# Patient Record
Sex: Male | Born: 1950
Health system: Southern US, Community
[De-identification: ages and names within clinical notes are randomized; demographics above are authoritative.]

## PROBLEM LIST (undated history)

## (undated) DIAGNOSIS — G4730 Sleep apnea, unspecified: Secondary | ICD-10-CM

## (undated) DIAGNOSIS — H269 Unspecified cataract: Secondary | ICD-10-CM

## (undated) DIAGNOSIS — R7303 Prediabetes: Secondary | ICD-10-CM

## (undated) DIAGNOSIS — E669 Obesity, unspecified: Secondary | ICD-10-CM

## (undated) DIAGNOSIS — E785 Hyperlipidemia, unspecified: Secondary | ICD-10-CM

## (undated) DIAGNOSIS — M1990 Unspecified osteoarthritis, unspecified site: Secondary | ICD-10-CM

## (undated) DIAGNOSIS — K219 Gastro-esophageal reflux disease without esophagitis: Secondary | ICD-10-CM

## (undated) DIAGNOSIS — E559 Vitamin D deficiency, unspecified: Secondary | ICD-10-CM

## (undated) HISTORY — DX: Unspecified cataract: H26.9

## (undated) HISTORY — PX: JOINT REPLACEMENT: SHX530

## (undated) HISTORY — DX: Vitamin D deficiency, unspecified: E55.9

## (undated) HISTORY — DX: Sleep apnea, unspecified: G47.30

## (undated) HISTORY — DX: Gastro-esophageal reflux disease without esophagitis: K21.9

## (undated) HISTORY — PX: SPINE SURGERY: SHX786

## (undated) HISTORY — PX: EYE SURGERY: SHX253

## (undated) HISTORY — DX: Obesity, unspecified: E66.9

## (undated) HISTORY — DX: Hyperlipidemia, unspecified: E78.5

## (undated) HISTORY — DX: Prediabetes: R73.03

## (undated) HISTORY — DX: Unspecified osteoarthritis, unspecified site: M19.90

---

## 1997-10-06 ENCOUNTER — Other Ambulatory Visit: Admission: RE | Admit: 1997-10-06 | Discharge: 1997-10-06 | Payer: Self-pay | Admitting: Internal Medicine

## 1997-12-20 ENCOUNTER — Ambulatory Visit (HOSPITAL_COMMUNITY): Admission: RE | Admit: 1997-12-20 | Discharge: 1997-12-20 | Payer: Self-pay | Admitting: Gastroenterology

## 1999-09-25 ENCOUNTER — Encounter: Admission: RE | Admit: 1999-09-25 | Discharge: 1999-12-24 | Payer: Self-pay | Admitting: Internal Medicine

## 2000-04-12 ENCOUNTER — Encounter: Admission: RE | Admit: 2000-04-12 | Discharge: 2000-04-12 | Payer: Self-pay | Admitting: Orthopedic Surgery

## 2000-04-12 ENCOUNTER — Encounter: Payer: Self-pay | Admitting: Orthopedic Surgery

## 2004-11-17 ENCOUNTER — Ambulatory Visit (HOSPITAL_COMMUNITY): Admission: RE | Admit: 2004-11-17 | Discharge: 2004-11-17 | Payer: Self-pay | Admitting: Internal Medicine

## 2004-12-15 ENCOUNTER — Ambulatory Visit: Payer: Self-pay | Admitting: Gastroenterology

## 2004-12-22 ENCOUNTER — Encounter (INDEPENDENT_AMBULATORY_CARE_PROVIDER_SITE_OTHER): Payer: Self-pay | Admitting: Specialist

## 2004-12-22 ENCOUNTER — Ambulatory Visit: Payer: Self-pay | Admitting: Gastroenterology

## 2005-11-23 ENCOUNTER — Ambulatory Visit (HOSPITAL_COMMUNITY): Admission: RE | Admit: 2005-11-23 | Discharge: 2005-11-23 | Payer: Self-pay | Admitting: Internal Medicine

## 2006-12-20 ENCOUNTER — Ambulatory Visit (HOSPITAL_COMMUNITY): Admission: RE | Admit: 2006-12-20 | Discharge: 2006-12-20 | Payer: Self-pay | Admitting: Internal Medicine

## 2009-10-31 ENCOUNTER — Encounter: Payer: Self-pay | Admitting: Gastroenterology

## 2009-12-23 ENCOUNTER — Encounter (INDEPENDENT_AMBULATORY_CARE_PROVIDER_SITE_OTHER): Payer: Self-pay | Admitting: *Deleted

## 2010-01-23 ENCOUNTER — Encounter (INDEPENDENT_AMBULATORY_CARE_PROVIDER_SITE_OTHER): Payer: Self-pay | Admitting: *Deleted

## 2010-01-24 ENCOUNTER — Ambulatory Visit: Payer: Self-pay | Admitting: Gastroenterology

## 2010-02-03 LAB — HM COLONOSCOPY

## 2010-02-08 ENCOUNTER — Ambulatory Visit: Payer: Self-pay | Admitting: Gastroenterology

## 2010-02-10 ENCOUNTER — Encounter: Payer: Self-pay | Admitting: Gastroenterology

## 2010-04-04 NOTE — Miscellaneous (Signed)
Summary: [facility]  Clinical Lists Changes  Medications: Added new medication of MOVIPREP 100 GM  SOLR (PEG-KCL-NACL-NASULF-NA ASC-C) As per prep instructions. - Signed Rx of MOVIPREP 100 GM  SOLR (PEG-KCL-NACL-NASULF-NA ASC-C) As per prep instructions.;  #1 x 0;  Signed;  Entered by: Ezra Sites RN;  Authorized by: Rachael Fee MD;  Method used: Electronically to [facility]  [address] [id number] [address], [place], [place]  [address], Ph: [phone] or [phone], Fax: [phone] Observations: Added new observation of NKA: T (01/24/2010 8:08)    Prescriptions: MOVIPREP 100 GM  SOLR (PEG-KCL-NACL-NASULF-NA ASC-C) As per prep instructions.  #1 x 0   Entered by:   Ezra Sites RN   Authorized by:   Rachael Fee MD   Signed by:   Ezra Sites RN on 01/24/2010   Method used:   [facility]ronically to        [facility]  [address] [id number] ([facility]       [address]       [place], [place]  [address]       Ph: [phone] or [phone]       Fax: [phone]   RxID:   [id number]

## 2010-04-04 NOTE — Procedures (Signed)
Summary: Colonoscopy  Patient: Jason Dennis Note: All result statuses are Final unless otherwise noted.  Tests: (1) Colonoscopy (COL)   COL Colonoscopy           DONE     [facility]     [address]     [place], [place]  [address]           COLONOSCOPY PROCEDURE REPORT           PATIENT:  Jason Dennis, Jason Dennis  MR#:  [id number]     BIRTHDATE:  29-Dec-1950, 59 yrs. old  GENDER:  male     ENDOSCOPIST:  Rachael Fee, MD     PROCEDURE DATE:  02/08/2010     PROCEDURE:  Colonoscopy with snare polypectomy     ASA CLASS:  Class II     INDICATIONS:  Elevated Risk Screening, father had colon cancer     (age unknown), personal history of hyperplastic polyps only     MEDICATIONS:   Fentanyl 50 mcg IV, Versed 5 mg IV           DESCRIPTION OF PROCEDURE:   After the risks benefits and     alternatives of the procedure were thoroughly explained, informed     consent was obtained.  Digital rectal exam was performed and     revealed no rectal masses.   The LB [id number]-[id number] endoscope     was introduced through the anus and advanced to the cecum, which     was identified by both the appendix and ileocecal valve, without     limitations.  The quality of the prep was good, using MoviPrep.     The instrument was then slowly withdrawn as the colon was fully     examined.     <<PROCEDUREIMAGES>>           FINDINGS:  Four sessile polyps were removed with cold snare, three     were retrieved and sent to pathology (jar 1). These were located     in ascending, descending and sigmoid segments (see image6 and     image7).  Mild diverticulosis was found in the sigmoid to     descending colon segments (see image1).  This was otherwise a     normal examination of the colon (see image4, image3, and image8).     Retroflexed views in the rectum revealed no abnormalities.    The     scope was then withdrawn from the patient and the procedure     completed.           COMPLICATIONS:  None  ENDOSCOPIC IMPRESSION:     1) Four polyps were found, all removed and three were retrived,     sent to pathology     2) Mild diverticulosis in the sigmoid to descending colon     segments     3) Otherwise normal examination           RECOMMENDATIONS:     1) Given your significant family history of colon cancer, you     should have a repeat colonoscopy in 3-5 years even if the polyps     removed today are not precancerous.     2) You will receive a letter within 1-2 weeks with the results     of your biopsy as well as final recommendations. Please call my     office if you have not received a letter after 3 weeks.  ______________________________     Rachael Fee, MD           n.     eSIGNED:   Rachael Fee at 02/08/2010 09:11 AM           Willeen Cass, [phone]  Note: An exclamation mark (!) indicates a result that was not dispersed into the flowsheet. Document Creation Date: 02/08/2010 9:11 AM _______________________________________________________________________  (1) Order result status: Final Collection or observation date-time: 02/08/2010 09:06 Requested date-time:  Receipt date-time:  Reported date-time:  Referring Physician:   Ordering Physician: Rob Bunting [phone]) Specimen Source:  Source: Launa Grill Order Number: [id number] Lab site:   Appended Document: Colonoscopy     Procedures Next Due Date:    Colonoscopy: 02/2013

## 2010-04-04 NOTE — Letter (Signed)
Summary: Pre Visit Letter Revised  [facility]  [address] [place], [place] [address]   Phone: [phone]  Fax: [phone]        12/23/2009 MRN: [id number] Jason Dennis [address] [place], [place]  [address]             Procedure Date:  02-08-10   Welcome to the Gastroenterology Division at [facility].    You are scheduled to see a nurse for your pre-procedure visit on 01-24-10 at 8:00a.m. on the 3rd floor at [facility], [address]  We ask that you try to arrive at our office 15 minutes prior to your appointment time to allow for check-in.  Please take a minute to review the attached form.  If you answer "Yes" to one or more of the questions on the first page, we ask that you call the person listed at your earliest opportunity.  If you answer "No" to all of the questions, please complete the rest of the form and bring it to your appointment.    Your nurse visit will consist of discussing your medical and surgical history, your immediate family medical history, and your medications.   If you are unable to list all of your medications on the form, please bring the medication bottles to your appointment and we will list them.  We will need to be aware of both prescribed and over the counter drugs.  We will need to know exact dosage information as well.    Please be prepared to read and sign documents such as consent forms, a financial agreement, and acknowledgement forms.  If necessary, and with your consent, a friend or relative is welcome to sit-in on the nurse visit with you.  Please bring your insurance card so that we may make a copy of it.  If your insurance requires a referral to see a specialist, please bring your referral form from your primary care physician.  No co-pay is required for this nurse visit.     If you cannot keep your appointment, please call [phone] to cancel or reschedule prior to your appointment date.  This allows  [place] the opportunity to schedule an appointment for another patient in need of care.    Thank you for choosing [facility] for your medical needs.  We appreciate the opportunity to care for you.  Please visit [place] at our website  to learn more about our practice.  Sincerely, The Gastroenterology Division

## 2010-04-04 NOTE — Letter (Signed)
Summary: [facility] Instructions  [facility]  [address] [place], [place] [address]   Phone: [phone]  Fax: [phone]       Jason Dennis    [birth year]    MRN: [id number]        Procedure Day Dorna Bloom:  Wednesday 02/08/2010     Arrival Time: 8:00 am      Procedure Time: 9:00 am     Location of Procedure:                    _x _  [facility] (4th Floor)                        PREPARATION FOR COLONOSCOPY WITH MOVIPREP   Starting 5 days prior to your procedure Friday 12/2 do not eat nuts, seeds, popcorn, corn, beans, peas,  salads, or any raw vegetables.  Do not take any fiber supplements (e.g. Metamucil, Citrucel, and Benefiber).  THE DAY BEFORE YOUR PROCEDURE         DATE: Tuesday 12/6  1.  Drink clear liquids the entire day-NO SOLID FOOD  2.  Do not drink anything colored red or purple.  Avoid juices with pulp.  No orange juice.  3.  Drink at least 64 oz. (8 glasses) of fluid/clear liquids during the day to prevent dehydration and help the prep work efficiently.  CLEAR LIQUIDS INCLUDE: Water Jello Ice Popsicles Tea (sugar ok, no milk/cream) Powdered fruit flavored drinks Coffee (sugar ok, no milk/cream) Gatorade Juice: apple, white grape, white cranberry  Lemonade Clear bullion, consomm, broth Carbonated beverages (any kind) Strained chicken noodle soup Hard Candy                             4.  In the morning, mix first dose of MoviPrep solution:    Empty 1 Pouch A and 1 Pouch B into the disposable container    Add lukewarm drinking water to the top line of the container. Mix to dissolve    Refrigerate (mixed solution should be used within 24 hrs)  5.  Begin drinking the prep at 5:00 p.m. The MoviPrep container is divided by 4 marks.   Every 15 minutes drink the solution down to the next mark (approximately 8 oz) until the full liter is complete.   6.  Follow completed prep with 16 oz of clear liquid of your choice (Nothing  red or purple).  Continue to drink clear liquids until bedtime.  7.  Before going to bed, mix second dose of MoviPrep solution:    Empty 1 Pouch A and 1 Pouch B into the disposable container    Add lukewarm drinking water to the top line of the container. Mix to dissolve    Refrigerate  THE DAY OF YOUR PROCEDURE      DATE: Wednesday 12/7  Beginning at 4:00 a.m. (5 hours before procedure):         1. Every 15 minutes, drink the solution down to the next mark (approx 8 oz) until the full liter is complete.  2. Follow completed prep with 16 oz. of clear liquid of your choice.    3. You may drink clear liquids until 7:00 am (2 HOURS BEFORE PROCEDURE).   MEDICATION INSTRUCTIONS  Unless otherwise instructed, you should take regular prescription medications with a small sip of water   as early as possible the morning of  your procedure.         OTHER INSTRUCTIONS  You will need a responsible adult at least 60 years of age to accompany you and drive you home.   This person must remain in the waiting room during your procedure.  Wear loose fitting clothing that is easily removed.  Leave jewelry and other valuables at home.  However, you may wish to bring a book to read or  an iPod/MP3 player to listen to music as you wait for your procedure to start.  Remove all body piercing jewelry and leave at home.  Total time from sign-in until discharge is approximately 2-3 hours.  You should go home directly after your procedure and rest.  You can resume normal activities the  day after your procedure.  The day of your procedure you should not:   Drive   Make legal decisions   Operate machinery   Drink alcohol   Return to work  You will receive specific instructions about eating, activities and medications before you leave.    The above instructions have been reviewed and explained to me by  Ezra Sites RN  January 24, 2010 8:22 AM     I fully understand and can  verbalize these instructions _____________________________ Date _________

## 2010-04-04 NOTE — Letter (Signed)
Summary: Colonoscopy Letter  [facility]  [address]   [place], [place] [address]   Phone: [phone]  Fax: [phone]      October 31, 2009 MRN: [id number]   Jason Dennis [address] [place], [place]  [address]   Dear Mr. Escudero,   According to your medical record, it is time for you to schedule a Colonoscopy. The American Cancer Society recommends this procedure as a method to detect early colon cancer. Patients with a family history of colon cancer, or a personal history of colon polyps or inflammatory bowel disease are at increased risk.  This letter has been generated based on the recommendations made at the time of your procedure. If you feel that in your particular situation this may no longer apply, please contact our office.  Please call our office at [phone] to schedule this appointment or to update your records at your earliest convenience.  Thank you for cooperating with us to provide you with the very best care possible.   Sincerely,  Daniel P. Jacobs, M.D.  [facility] Gastroenterology Division [phone] 

## 2010-04-04 NOTE — Letter (Signed)
Summary: Colonoscopy Letter  [facility]  [address] [place], [place] [address]   Phone: [phone]  Fax: [phone]      October 31, 2009 MRN: [id number]   ALTONIO SCHWERTNER [address] [place], [place]  [address]   Dear Mr. Williard,   According to your medical record, it is time for you to schedule a Colonoscopy. The American Cancer Society recommends this procedure as a method to detect early colon cancer. Patients with a family history of colon cancer, or a personal history of colon polyps or inflammatory bowel disease are at increased risk.  This letter has been generated based on the recommendations made at the time of your procedure. If you feel that in your particular situation this may no longer apply, please contact our office.  Please call our office at [phone] to schedule this appointment or to update your records at your earliest convenience.  Thank you for cooperating with [place] to provide you with the very best care possible.   Sincerely,  Rachael Fee, M.D.  [facility] Gastroenterology Division [phone]

## 2010-04-06 NOTE — Letter (Signed)
Summary: Results Letter  [facility]  [address] [place], [place] [address]   Phone: [phone]  Fax: [phone]        February 10, 2010 MRN: [id number]    Jason Dennis [address] [place], [place]  [address]    Dear [name],   The polyps removed during your recent procedure were proven to be adenomatous.  These are pre-cancerous polyps that may have grown into cancers if they had not been removed.  Based on current nationally recognized surveillance guidelines, I recommend that you have a repeat colonoscopy in 3 years.   We will therefore put your information in our reminder system and will contact you in 3 years to schedule a repeat procedure.  Please call if you have any questions or concerns.       Sincerely,  Rachael Fee MD  This letter has been electronically signed by your physician.  Appended Document: Results Letter Letter mailed

## 2012-12-09 ENCOUNTER — Encounter: Payer: Self-pay | Admitting: Gastroenterology

## 2013-02-03 ENCOUNTER — Encounter: Payer: Self-pay | Admitting: Physician Assistant

## 2013-02-06 ENCOUNTER — Encounter: Payer: Self-pay | Admitting: Physician Assistant

## 2013-02-06 DIAGNOSIS — E669 Obesity, unspecified: Secondary | ICD-10-CM | POA: Insufficient documentation

## 2013-02-06 DIAGNOSIS — I10 Essential (primary) hypertension: Secondary | ICD-10-CM | POA: Insufficient documentation

## 2013-02-06 DIAGNOSIS — E782 Mixed hyperlipidemia: Secondary | ICD-10-CM | POA: Insufficient documentation

## 2013-02-06 DIAGNOSIS — E663 Overweight: Secondary | ICD-10-CM | POA: Insufficient documentation

## 2013-02-06 DIAGNOSIS — E559 Vitamin D deficiency, unspecified: Secondary | ICD-10-CM | POA: Insufficient documentation

## 2013-02-06 DIAGNOSIS — R7309 Other abnormal glucose: Secondary | ICD-10-CM | POA: Insufficient documentation

## 2013-02-06 NOTE — Progress Notes (Signed)
This encounter was created in error - please disregard.

## 2013-03-04 ENCOUNTER — Other Ambulatory Visit: Payer: Self-pay | Admitting: Internal Medicine

## 2013-03-06 ENCOUNTER — Encounter: Payer: Self-pay | Admitting: Physician Assistant

## 2013-03-06 ENCOUNTER — Ambulatory Visit (INDEPENDENT_AMBULATORY_CARE_PROVIDER_SITE_OTHER): Payer: BC Managed Care – PPO | Admitting: Physician Assistant

## 2013-03-06 VITALS — BP 122/70 | HR 76 | Temp 98.3°F | Resp 16 | Ht 71.0 in | Wt 201.0 lb

## 2013-03-06 DIAGNOSIS — Z79899 Other long term (current) drug therapy: Secondary | ICD-10-CM

## 2013-03-06 DIAGNOSIS — E782 Mixed hyperlipidemia: Secondary | ICD-10-CM

## 2013-03-06 DIAGNOSIS — R7303 Prediabetes: Secondary | ICD-10-CM

## 2013-03-06 DIAGNOSIS — E785 Hyperlipidemia, unspecified: Secondary | ICD-10-CM

## 2013-03-06 DIAGNOSIS — E559 Vitamin D deficiency, unspecified: Secondary | ICD-10-CM

## 2013-03-06 DIAGNOSIS — I10 Essential (primary) hypertension: Secondary | ICD-10-CM

## 2013-03-06 DIAGNOSIS — R7309 Other abnormal glucose: Secondary | ICD-10-CM

## 2013-03-06 LAB — URIC ACID: Uric Acid, Serum: 5.9 mg/dL (ref 4.0–7.8)

## 2013-03-06 LAB — CBC WITH DIFFERENTIAL/PLATELET
Basophils Absolute: 0 10*3/uL (ref 0.0–0.1)
Basophils Relative: 0 % (ref 0–1)
EOS ABS: 0.3 10*3/uL (ref 0.0–0.7)
Eosinophils Relative: 6 % — ABNORMAL HIGH (ref 0–5)
HEMATOCRIT: 43 % (ref 39.0–52.0)
Hemoglobin: 14.7 g/dL (ref 13.0–17.0)
Lymphocytes Relative: 20 % (ref 12–46)
Lymphs Abs: 1.1 10*3/uL (ref 0.7–4.0)
MCH: 29.7 pg (ref 26.0–34.0)
MCHC: 34.2 g/dL (ref 30.0–36.0)
MCV: 86.9 fL (ref 78.0–100.0)
MONO ABS: 0.6 10*3/uL (ref 0.1–1.0)
MONOS PCT: 10 % (ref 3–12)
Neutro Abs: 3.5 10*3/uL (ref 1.7–7.7)
Neutrophils Relative %: 64 % (ref 43–77)
Platelets: 232 10*3/uL (ref 150–400)
RBC: 4.95 MIL/uL (ref 4.22–5.81)
RDW: 13.3 % (ref 11.5–15.5)
WBC: 5.5 10*3/uL (ref 4.0–10.5)

## 2013-03-06 LAB — LIPID PANEL
Cholesterol: 160 mg/dL (ref 0–200)
HDL: 59 mg/dL (ref >39)
LDL CALC: 87 mg/dL (ref 0–99)
TRIGLYCERIDES: 68 mg/dL (ref <150)
Total CHOL/HDL Ratio: 2.7 Ratio
VLDL: 14 mg/dL (ref 0–40)

## 2013-03-06 LAB — HEPATIC FUNCTION PANEL
ALT: 32 U/L (ref 0–53)
AST: 21 U/L (ref 0–37)
Albumin: 4.5 g/dL (ref 3.5–5.2)
Alkaline Phosphatase: 86 U/L (ref 39–117)
BILIRUBIN DIRECT: 0.2 mg/dL (ref 0.0–0.3)
BILIRUBIN INDIRECT: 1 mg/dL — AB (ref 0.0–0.9)
TOTAL PROTEIN: 6.7 g/dL (ref 6.0–8.3)
Total Bilirubin: 1.2 mg/dL (ref 0.3–1.2)

## 2013-03-06 LAB — TSH: TSH: 2.213 u[IU]/mL (ref 0.350–4.500)

## 2013-03-06 LAB — HEMOGLOBIN A1C
HEMOGLOBIN A1C: 5.8 % — AB (ref <5.7)
MEAN PLASMA GLUCOSE: 120 mg/dL — AB (ref <117)

## 2013-03-06 LAB — BASIC METABOLIC PANEL WITH GFR
BUN: 19 mg/dL (ref 6–23)
CALCIUM: 9.8 mg/dL (ref 8.4–10.5)
CHLORIDE: 104 meq/L (ref 96–112)
CO2: 28 meq/L (ref 19–32)
CREATININE: 0.96 mg/dL (ref 0.50–1.35)
GFR, Est African American: >89 mL/min
GFR, Est Non African American: 84 mL/min
Glucose, Bld: 90 mg/dL (ref 70–99)
Potassium: 5.2 mEq/L (ref 3.5–5.3)
Sodium: 145 mEq/L (ref 135–145)

## 2013-03-06 LAB — MAGNESIUM: Magnesium: 1.9 mg/dL (ref 1.5–2.5)

## 2013-03-06 NOTE — Progress Notes (Signed)
HPI Patient presents for 3 month follow up with hypertension, hyperlipidemia, prediabetes and vitamin D. Patient's blood pressure has been controlled at home, today their BP is BP: 122/70 mmHg  Patient denies chest pain, shortness of breath, dizziness.  Patient's cholesterol is diet controlled. The cholesterol last visit was LDL 87, trigs 151, HDL 44.  The patient has been working on diet and exercise for prediabetes and has lost 18 lbs, and denies changes in vision, polys, and paresthesias. A1C 5.9.  Patient is on Vitamin D supplement.  Vitamin D 69.  Patient is right handed and states that he is waking up in the morning and his right 4th digit and his left 3rd digit is flexed and he has to manually pop it to extend it. It it not painful, erythema, swollen and does not bother him during the day. Patient states he plays guitar daily.  Current Medications:  Current Outpatient Prescriptions on File Prior to Visit  Medication Sig Dispense Refill  . aspirin 81 MG tablet Take 81 mg by mouth every other day.       . cholecalciferol (VITAMIN D) 1000 UNITS tablet Take 10,000 Units by mouth daily.      . meclizine (ANTIVERT) 25 MG tablet TAKE ONE TABLET BY MOUTH THREE TIMES DAILY AS NEEDED FOR DIZZINESS/VERTIGO.  30 tablet  0  . meloxicam (MOBIC) 15 MG tablet Take 15 mg by mouth daily.      . naproxen sodium (ANAPROX) 220 MG tablet Take 220 mg by mouth 2 (two) times daily with a meal.      . Omega-3 Fatty Acids (FISH OIL) 1000 MG CAPS Take by mouth.      Marland Kitchen omeprazole (PRILOSEC) 20 MG capsule Take 20 mg by mouth daily.       No current facility-administered medications on file prior to visit.   Medical History:  Past Medical History  Diagnosis Date  . Hyperlipidemia   . Hypertension     labile  . GERD (gastroesophageal reflux disease)   . Prediabetes   . Vitamin D deficiency   . OSA (obstructive sleep apnea)     On CPAP 10 cm  . Obesity    Allergies:  Allergies  Allergen Reactions  . Cialis  [Tadalafil]     ROS Constitutional: Denies fever, chills, headaches, insomnia, fatigue, night sweats Eyes: Denies redness, blurred vision, diplopia, discharge, itchy, watery eyes.  ENT: Denies congestion, post nasal drip, sore throat, earache, dental pain, Tinnitus, Vertigo, Sinus pain, snoring.  Cardio: Denies chest pain, palpitations, irregular heartbeat, dyspnea, diaphoresis, orthopnea, PND, claudication, edema Respiratory: denies cough, shortness of breath, wheezing.  Gastrointestinal: Denies dysphagia, heartburn, AB pain/ cramps, N/V, diarrhea, constipation, hematemesis, melena, hematochezia,  hemorrhoids Genitourinary: Denies dysuria, frequency, urgency, nocturia, hesitancy, discharge, hematuria, flank pain Musculoskeletal: + trigger finger Denies myalgia, stiffness, pain, swelling and strain/sprain. Skin: Denies pruritis, rash, changing in skin lesion Neuro: Denies Weakness, tremor, incoordination, spasms, pain Psychiatric: Denies confusion, memory loss, sensory loss Endocrine: Denies change in weight, skin, hair change, nocturia Diabetic Polys, Denies visual blurring, hyper /hypo glycemic episodes, and paresthesia, Heme/Lymph: Denies Excessive bleeding, bruising, enlarged lymph nodes  Family history- Review and unchanged Social history- Review and unchanged Physical Exam: Filed Vitals:   03/06/13 0843  BP: 122/70  Pulse: 76  Temp: 98.3 F (36.8 C)  Resp: 16   Filed Weights   03/06/13 0843  Weight: 201 lb (91.173 kg)   General Appearance: Well nourished, in no apparent distress. Eyes: PERRLA, EOMs, conjunctiva no swelling  or erythema Sinuses: No Frontal/maxillary tenderness ENT/Mouth: Ext aud canals clear, TMs without erythema, bulging. No erythema, swelling, or exudate on post pharynx.  Tonsils not swollen or erythematous. Hearing normal.  Neck: Supple, thyroid normal.  Respiratory: Respiratory effort normal, BS equal bilaterally without rales, rhonchi, wheezing or  stridor.  Cardio: RRR with no MRGs. Brisk peripheral pulses without edema.  Abdomen: Soft, + BS.  Non tender, no guarding, rebound, hernias, masses. Lymphatics: Non tender without lymphadenopathy.  Musculoskeletal: Full ROM, 5/5 strength, normal gait. Bilateral hands without swelling, erythema, or warmth. Mild trigger finger on right 3rd greater than left 4th.  Skin: Warm, dry without rashes, lesions, ecchymosis.  Neuro: Cranial nerves intact. Normal muscle tone, no cerebellar symptoms. Sensation intact.  Psych: Awake and oriented X 3, normal affect, Insight and Judgment appropriate.   Assessment and Plan:  Hypertension: Continue medication, monitor blood pressure at home.  Continue DASH diet. Cholesterol: Continue diet and exercise. Check cholesterol.  Pre-diabetes-Continue diet and exercise. Check A1C Vitamin D Def- check level and continue medications.  Trigger finger- check uric acid, patient has family history and given conservative measures does not want injection at this time.  Continue diet and meds as discussed. Further disposition pending results of labs.  Vicie Mutters 8:50 AM

## 2013-03-06 NOTE — Patient Instructions (Addendum)
Trigger Finger Trigger finger (digital tendinitis and stenosing tenosynovitis) is a common disorder that causes an often painful catching of the fingers or thumb. It occurs as a clicking, snapping, or locking of a finger in the palm of the hand. This is caused by a problem with the tendons that flex or bend the fingers sliding smoothly through their sheaths. The condition may occur in any finger or a couple fingers at the same time.  The finger may lock with the finger curled or suddenly straighten out with a snap. This is more common in patients with rheumatoid arthritis and diabetes. Left untreated, the condition may get worse to the point where the finger becomes locked in flexion, like making a fist, or less commonly locked with the finger straightened out. CAUSES   Inflammation and scarring that lead to swelling around the tendon sheath.  Repeated or forceful movements.  Rheumatoid arthritis, an autoimmune disease that affects joints.  Gout.  Diabetes mellitus. SIGNS AND SYMPTOMS  Soreness and swelling of your finger.  A painful clicking or snapping as you bend and straighten your finger. DIAGNOSIS  Your health care provider will do a physical exam of your finger to diagnose trigger finger. TREATMENT   Splinting for 6 8 weeks may be helpful.  Nonsteroidal anti-inflammatory medicines (NSAIDs) can help to relieve the pain and inflammation.  Cortisone injections, along with splinting, may speed up recovery. Several injections may be required. Cortisone may give relief after one injection.  Surgery is another treatment that may be used if conservative treatments do not work. Surgery can be minor, without incisions (a cut does not have to be made), and can be done with a needle through the skin.  Other surgical choices involve an open procedure in which the surgeon opens the hand through a small incision and cuts the pulley so the tendon can again slide smoothly. Your hand will still  work fine. HOME CARE INSTRUCTIONS  Apply ice to the injured area, twice per day:  Put ice in a plastic bag.  Place a towel between your skin and the bag.  Leave the ice on for 20 minutes, 3 4 times a day.  Rest your hand often. MAKE SURE YOU:   Understand these instructions.  Will watch your condition.  Will get help right away if you are not doing well or get worse. Document Released: 12/10/2003 Document Revised: 10/22/2012 Document Reviewed: 07/22/2012 [facility] Patient Information 2014 [place].  I suggest getting a finger splint that keeps your finger straight and wear it at night for 2-4 weeks, continue the meloxicam daily with food, and ice it.     Bad carbs also include fruit juice, alcohol, and sweet tea. These are empty calories that do not signal to your brain that you are full.   Please remember the good carbs are still carbs which convert into sugar. So please measure them out no more than 1/2-1 cup of rice, oatmeal, pasta, and beans.  Veggies are however free foods! Pile them on.   I like lean protein at every meal such as chicken, [place], pork chops, cottage cheese, etc. Just do not fry these meats and please center your meal around vegetable, the meats should be a side dish.   No all fruit is created equal. Please see the list below, the fruit at the bottom is higher in sugars than the fruit at the top

## 2013-03-07 LAB — VITAMIN D 25 HYDROXY (VIT D DEFICIENCY, FRACTURES): Vit D, 25-Hydroxy: 71 ng/mL (ref 30–89)

## 2013-03-07 LAB — INSULIN, FASTING: INSULIN FASTING, SERUM: 11 u[IU]/mL (ref 3–28)

## 2013-04-05 ENCOUNTER — Other Ambulatory Visit: Payer: Self-pay | Admitting: Physician Assistant

## 2013-05-08 ENCOUNTER — Ambulatory Visit: Payer: Self-pay | Admitting: Internal Medicine

## 2013-05-15 ENCOUNTER — Ambulatory Visit (INDEPENDENT_AMBULATORY_CARE_PROVIDER_SITE_OTHER): Payer: BC Managed Care – PPO | Admitting: Physician Assistant

## 2013-05-15 VITALS — BP 126/64 | HR 80 | Temp 99.0°F | Resp 16 | Wt 207.6 lb

## 2013-05-15 DIAGNOSIS — R7989 Other specified abnormal findings of blood chemistry: Secondary | ICD-10-CM

## 2013-05-15 DIAGNOSIS — L299 Pruritus, unspecified: Secondary | ICD-10-CM

## 2013-05-15 DIAGNOSIS — R21 Rash and other nonspecific skin eruption: Secondary | ICD-10-CM

## 2013-05-15 LAB — CBC WITH DIFFERENTIAL/PLATELET
BASOS PCT: 1 % (ref 0–1)
Basophils Absolute: 0.1 10*3/uL (ref 0.0–0.1)
EOS ABS: 0.7 10*3/uL (ref 0.0–0.7)
Eosinophils Relative: 11 % — ABNORMAL HIGH (ref 0–5)
HCT: 41.4 % (ref 39.0–52.0)
Hemoglobin: 14.2 g/dL (ref 13.0–17.0)
LYMPHS ABS: 1.1 10*3/uL (ref 0.7–4.0)
Lymphocytes Relative: 18 % (ref 12–46)
MCH: 30.2 pg (ref 26.0–34.0)
MCHC: 34.3 g/dL (ref 30.0–36.0)
MCV: 88.1 fL (ref 78.0–100.0)
Monocytes Absolute: 0.6 10*3/uL (ref 0.1–1.0)
Monocytes Relative: 9 % (ref 3–12)
NEUTROS PCT: 61 % (ref 43–77)
Neutro Abs: 3.8 10*3/uL (ref 1.7–7.7)
Platelets: 219 10*3/uL (ref 150–400)
RBC: 4.7 MIL/uL (ref 4.22–5.81)
RDW: 13.2 % (ref 11.5–15.5)
WBC: 6.3 10*3/uL (ref 4.0–10.5)

## 2013-05-15 LAB — BASIC METABOLIC PANEL WITH GFR
BUN: 18 mg/dL (ref 6–23)
CALCIUM: 9.4 mg/dL (ref 8.4–10.5)
CO2: 29 mEq/L (ref 19–32)
Chloride: 104 mEq/L (ref 96–112)
Creat: 0.84 mg/dL (ref 0.50–1.35)
GFR, Est Non African American: >89 mL/min
Glucose, Bld: 82 mg/dL (ref 70–99)
Potassium: 4.5 mEq/L (ref 3.5–5.3)
SODIUM: 140 meq/L (ref 135–145)

## 2013-05-15 LAB — HEPATIC FUNCTION PANEL
ALT: 29 U/L (ref 0–53)
AST: 19 U/L (ref 0–37)
Albumin: 4.4 g/dL (ref 3.5–5.2)
Alkaline Phosphatase: 109 U/L (ref 39–117)
BILIRUBIN DIRECT: 0.1 mg/dL (ref 0.0–0.3)
BILIRUBIN TOTAL: 0.5 mg/dL (ref 0.2–1.2)
Indirect Bilirubin: 0.4 mg/dL (ref 0.2–1.2)
Total Protein: 6.3 g/dL (ref 6.0–8.3)

## 2013-05-15 MED ORDER — PREDNISONE 20 MG PO TABS: ORAL_TABLET | ORAL | Status: DC

## 2013-05-15 NOTE — Progress Notes (Addendum)
   Subjective:    Patient ID: Jason Dennis, male    DOB: 25-Apr-1950, 63 y.o.   MRN: [id number]  HPI 63 y.o. male presents with history of chills,and pruritis without rash for 1 week. States it is diffuse, over back, arms, legs, not on face, hands, feet, genitals. No changes in detergents, medications, etc. Lotion and benadryl has helped, he has decreased heat in showers. He does work outside a lot and sweats.   Review of Systems  Constitutional: Positive for chills. Negative for fever and fatigue.  HENT: Negative.   Eyes: Negative.   Respiratory: Positive for cough. Negative for chest tightness, shortness of breath and wheezing.   Cardiovascular: Negative.   Gastrointestinal: Negative.   Genitourinary: Negative.   Musculoskeletal: Negative.   Skin: Positive for rash.       Pruritis  Neurological: Negative.   Hematological: Negative for adenopathy. Bruises/bleeds easily.       Objective:   Physical Exam  Constitutional: He is oriented to person, place, and time. He appears well-developed and well-nourished.  HENT:  Head: Normocephalic and atraumatic.  Right Ear: External ear normal.  Left Ear: External ear normal.  Eyes: Conjunctivae and EOM are normal. Pupils are equal, round, and reactive to light.  Neck: Normal range of motion. Neck supple.  Cardiovascular: Normal rate, regular rhythm and normal heart sounds.   Pulmonary/Chest: Effort normal and breath sounds normal.  Abdominal: Soft. Bowel sounds are normal. He exhibits no mass. There is no tenderness. There is no guarding.  Musculoskeletal: Normal range of motion.  Lymphadenopathy:    He has no cervical adenopathy.  Neurological: He is alert and oriented to person, place, and time.  Skin: Skin is warm and dry.  Questionable diffuse raised macules/welps on back, linear/christmas tree pattern.       Assessment & Plan:  Dermatitis- ? Viral vs Pityriasis rosea- rule out kidney,liver, lyme/RMSF since he works outside,  etc -check labs -Prednisone 20mg  #20 NR -continue benadryl Follow up if not better/any changes.   Possible + RMSF, with rash, chills, etc sent in Doxycyline BID for one month.

## 2013-05-15 NOTE — Patient Instructions (Addendum)
STOP meloxicam while on prednisone, you can restart it afterwards  Please take the prednisone to help decrease inflammation and therefore decrease symptoms. Take it it with food to avoid GI upset. It can cause increased energy but on the other hand it can make it hard to sleep at night so please take it in the morning.  If you are diabetic it will increase your sugars.    Pityriasis Rosea Pityriasis rosea is a rash which is probably caused by a virus. It generally starts as a scaly, red patch on the trunk (the area of the body that a t-shirt would cover) but does not appear on sun exposed areas. The rash is usually preceded by an initial larger spot called the "herald patch" a week or more before the rest of the rash appears. Generally within one to two days the rash appears rapidly on the trunk, upper arms, and sometimes the upper legs. The rash usually appears as flat, oval patches of scaly pink color. The rash can also be raised and one is able to feel it with a finger. The rash can also be finely crinkled and may slough off leaving a ring of scale around the spot. Sometimes a mild sore throat is present with the rash. It usually affects children and young adults in the spring and autumn. Women are more frequently affected than men. TREATMENT  Pityriasis rosea is a self-limited condition. This means it goes away within 4 to 8 weeks without treatment. The spots may persist for several months, especially in darker-colored skin after the rash has resolved and healed. Benadryl and steroid creams may be used if itching is a problem. SEEK MEDICAL CARE IF:   Your rash does not go away or persists longer than three months.  You develop fever and joint pain.  You develop severe headache and confusion.  You develop breathing difficulty, vomiting and/or extreme weakness. Document Released: 03/28/2001 Document Revised: 05/14/2011 Document Reviewed: 04/16/2008 [facility] Patient Information 2014 [place]. Rash A rash is a change in the color or texture of your skin. There are many different types of rashes. You may have other problems that accompany your rash. CAUSES   Infections.  Allergic reactions. This can include allergies to pets or foods.  Certain medicines.  Exposure to certain chemicals, soaps, or cosmetics.  Heat.  Exposure to poisonous plants.  Tumors, both cancerous and noncancerous. SYMPTOMS   Redness.  Scaly skin.  Itchy skin.  Dry or cracked skin.  Bumps.  Blisters.  Pain. DIAGNOSIS  Your caregiver may do a physical exam to determine what type of rash you have. A skin sample (biopsy) may be taken and examined under a microscope. TREATMENT  Treatment depends on the type of rash you have. Your caregiver may prescribe certain medicines. For serious conditions, you may need to see a skin doctor (dermatologist). HOME CARE INSTRUCTIONS   Avoid the substance that caused your rash.  Do not scratch your rash. This can cause infection.  You may take cool baths to help stop itching.  Only take over-the-counter or prescription medicines as directed by your caregiver.  Keep all follow-up appointments as directed by your caregiver. SEEK IMMEDIATE MEDICAL CARE IF:  You have increasing pain, swelling, or redness.  You have a fever.  You have new or severe symptoms.  You have body aches, diarrhea, or vomiting.  Your rash is not better after 3 days. MAKE SURE YOU:  Understand these instructions.  Will watch your condition.  Will  get help right away if you are not doing well or get worse. Document Released: 02/09/2002 Document Revised: 05/14/2011 Document Reviewed: 12/04/2010 [facility] Patient Information 2014 [place], [place].

## 2013-05-18 LAB — LYME ABY, WSTRN BLT IGG & IGM W/BANDS
B burgdorferi IgG Abs (IB): NEGATIVE
B burgdorferi IgM Abs (IB): NEGATIVE
LYME DISEASE 23 KD IGM: NONREACTIVE
LYME DISEASE 28 KD IGG: NONREACTIVE
LYME DISEASE 30 KD IGG: NONREACTIVE
Lyme Disease 18 kD IgG: NONREACTIVE
Lyme Disease 23 kD IgG: NONREACTIVE
Lyme Disease 39 kD IgG: NONREACTIVE
Lyme Disease 39 kD IgM: NONREACTIVE
Lyme Disease 41 kD IgG: NONREACTIVE
Lyme Disease 41 kD IgM: NONREACTIVE
Lyme Disease 45 kD IgG: NONREACTIVE
Lyme Disease 58 kD IgG: NONREACTIVE
Lyme Disease 66 kD IgG: NONREACTIVE
Lyme Disease 93 kD IgG: NONREACTIVE

## 2013-05-18 LAB — ROCKY MTN SPOTTED FVR ABS PNL(IGG+IGM)
RMSF IGG: 1.72 IV — AB
RMSF IgM: 0.17 IV

## 2013-05-19 MED ORDER — DOXYCYCLINE HYCLATE 100 MG PO TABS: 100.0000 mg | ORAL_TABLET | Freq: Two times a day (BID) | ORAL | Status: DC

## 2013-05-19 NOTE — Addendum Note (Signed)
Addended by: Vladimir Crofts on: 05/19/2013 08:49 AM   Modules accepted: Orders

## 2013-06-05 ENCOUNTER — Ambulatory Visit: Payer: Self-pay | Admitting: Internal Medicine

## 2013-06-28 ENCOUNTER — Other Ambulatory Visit: Payer: Self-pay | Admitting: Internal Medicine

## 2013-08-01 ENCOUNTER — Encounter: Payer: Self-pay | Admitting: Gastroenterology

## 2013-08-21 ENCOUNTER — Encounter: Payer: Self-pay | Admitting: Physician Assistant

## 2013-08-21 ENCOUNTER — Ambulatory Visit (INDEPENDENT_AMBULATORY_CARE_PROVIDER_SITE_OTHER): Payer: BC Managed Care – PPO | Admitting: Physician Assistant

## 2013-08-21 VITALS — BP 132/68 | HR 68 | Temp 99.3°F | Resp 16 | Wt 211.4 lb

## 2013-08-21 DIAGNOSIS — Z87891 Personal history of nicotine dependence: Secondary | ICD-10-CM

## 2013-08-21 DIAGNOSIS — Z79899 Other long term (current) drug therapy: Secondary | ICD-10-CM

## 2013-08-21 DIAGNOSIS — R6883 Chills (without fever): Secondary | ICD-10-CM

## 2013-08-21 DIAGNOSIS — R21 Rash and other nonspecific skin eruption: Secondary | ICD-10-CM

## 2013-08-21 DIAGNOSIS — L299 Pruritus, unspecified: Secondary | ICD-10-CM

## 2013-08-21 LAB — CBC WITH DIFFERENTIAL/PLATELET
BASOS PCT: 1 % (ref 0–1)
Basophils Absolute: 0.1 10*3/uL (ref 0.0–0.1)
EOS ABS: 0.6 10*3/uL (ref 0.0–0.7)
EOS PCT: 11 % — AB (ref 0–5)
HCT: 41.3 % (ref 39.0–52.0)
Hemoglobin: 14.2 g/dL (ref 13.0–17.0)
Lymphocytes Relative: 20 % (ref 12–46)
Lymphs Abs: 1.1 10*3/uL (ref 0.7–4.0)
MCH: 30.2 pg (ref 26.0–34.0)
MCHC: 34.4 g/dL (ref 30.0–36.0)
MCV: 87.9 fL (ref 78.0–100.0)
MONOS PCT: 10 % (ref 3–12)
Monocytes Absolute: 0.6 10*3/uL (ref 0.1–1.0)
NEUTROS PCT: 58 % (ref 43–77)
Neutro Abs: 3.2 10*3/uL (ref 1.7–7.7)
Platelets: 238 10*3/uL (ref 150–400)
RBC: 4.7 MIL/uL (ref 4.22–5.81)
RDW: 12.9 % (ref 11.5–15.5)
WBC: 5.6 10*3/uL (ref 4.0–10.5)

## 2013-08-21 LAB — MAGNESIUM: Magnesium: 2 mg/dL (ref 1.5–2.5)

## 2013-08-21 LAB — COMPREHENSIVE METABOLIC PANEL
ALK PHOS: 95 U/L (ref 39–117)
ALT: 30 U/L (ref 0–53)
AST: 20 U/L (ref 0–37)
Albumin: 4.3 g/dL (ref 3.5–5.2)
BILIRUBIN TOTAL: 0.4 mg/dL (ref 0.2–1.2)
BUN: 18 mg/dL (ref 6–23)
CO2: 25 mEq/L (ref 19–32)
CREATININE: 1.1 mg/dL (ref 0.50–1.35)
Calcium: 9.7 mg/dL (ref 8.4–10.5)
Chloride: 106 mEq/L (ref 96–112)
GLUCOSE: 103 mg/dL — AB (ref 70–99)
Potassium: 4.3 mEq/L (ref 3.5–5.3)
Sodium: 139 mEq/L (ref 135–145)
TOTAL PROTEIN: 6.2 g/dL (ref 6.0–8.3)

## 2013-08-21 LAB — SEDIMENTATION RATE: Sed Rate: 1 mm/hr (ref 0–16)

## 2013-08-21 MED ORDER — PREDNISONE 20 MG PO TABS: ORAL_TABLET | ORAL | Status: DC

## 2013-08-21 MED ORDER — TRIAMCINOLONE ACETONIDE 0.1 % EX CREA: 1.0000 "application " | TOPICAL_CREAM | Freq: Two times a day (BID) | CUTANEOUS | Status: DC

## 2013-08-21 NOTE — Progress Notes (Signed)
Subjective:    Patient ID: Jason Dennis, male    DOB: [birth year], 63 y.o.   MRN: [id number]  HPI 63 y.o. presents with continues pruritis. He was treated for possible Pityriasis rosea versus RMSF in March, he states that as soon as he got off the prednisone the rash and itching came right back. He has a low grade temperature today, states the itching is worse after he gets cold chills, and he notes he does not have any hair under axilla.    Review of Systems  Constitutional: Positive for chills. Negative for fever, diaphoresis, appetite change and fatigue.  HENT: Negative.   Eyes: Negative.   Respiratory: Negative.   Cardiovascular: Negative.   Gastrointestinal: Negative.   Genitourinary: Negative.   Musculoskeletal: Negative.   Skin: Positive for rash. Negative for color change, pallor and wound.  Neurological: Negative.        Objective:   Physical Exam  Constitutional: He is oriented to person, place, and time. He appears well-developed and well-nourished.  HENT:  Head: Normocephalic and atraumatic.  Right Ear: External ear normal.  Left Ear: External ear normal.  Eyes: Conjunctivae and EOM are normal. Pupils are equal, round, and reactive to light.  Neck: Normal range of motion. Neck supple.  Cardiovascular: Normal rate, regular rhythm and normal heart sounds.   Pulmonary/Chest: Effort normal and breath sounds normal.  Abdominal: Soft. Bowel sounds are normal. He exhibits no mass. There is no tenderness. There is no guarding.  Musculoskeletal: Normal range of motion.  Lymphadenopathy:    He has no cervical adenopathy.  Neurological: He is alert and oriented to person, place, and time.  Skin: Skin is warm and dry.  Questionable diffuse raised macules on back but not observed       Assessment & Plan:  Rash? Versus Prurutis Check RMSF, get CXR, CBC, Cmet, ANA, ESR, AntiDNA, complement  Obesity- phentermine 37.5 #30

## 2013-08-21 NOTE — Patient Instructions (Addendum)
Zyrtec or certizine at night Do triamcinolone cream at least once a day  Pruritus  Pruritis is an itch. There are many different problems that can cause an itch. Dry skin is one of the most common causes of itching. Most cases of itching do not require medical attention.  HOME CARE INSTRUCTIONS  Make sure your skin is moistened on a regular basis. A moisturizer that contains petroleum jelly is best for keeping moisture in your skin. If you develop a rash, you may try the following for relief:   Use corticosteroid cream.  Apply cool compresses to the affected areas.  Bathe with Epsom salts or baking soda in the bathwater.  Soak in colloidal oatmeal baths. These are available at your pharmacy.  Apply baking soda paste to the rash. Stir water into baking soda until it reaches a paste-like consistency.  Use an anti-itch lotion.  Take over-the-counter diphenhydramine medicine by mouth as the instructions direct.  Avoid scratching. Scratching may cause the rash to become infected. If itching is very bad, your caregiver may suggest prescription lotions or creams to lessen your symptoms.  Avoid hot showers, which can make itching worse. A cold shower may help with itching as long as you use a moisturizer after the shower. SEEK MEDICAL CARE IF: The itching does not go away after several days. Document Released: 11/01/2010 Document Revised: 05/14/2011 Document Reviewed: 11/01/2010 [facility] Patient Information 2015 [place], [place]. This information is not intended to replace advice given to you by your health care provider. Make sure you discuss any questions you have with your health care provider.

## 2013-08-24 LAB — ANA: Anti Nuclear Antibody(ANA): NEGATIVE

## 2013-08-24 LAB — COMPLEMENT, TOTAL: COMPL TOTAL (CH50): 53 U/mL (ref 31–60)

## 2013-08-24 LAB — ANTI-DNA ANTIBODY, DOUBLE-STRANDED

## 2013-08-26 LAB — ROCKY MTN SPOTTED FVR ABS PNL(IGG+IGM)
RMSF IgG: 0.86 IV — ABNORMAL HIGH
RMSF IgM: 0.19 IV

## 2013-09-22 ENCOUNTER — Other Ambulatory Visit: Payer: Self-pay | Admitting: Internal Medicine

## 2013-09-22 MED ORDER — PHENTERMINE HCL 37.5 MG PO TABS: ORAL_TABLET | ORAL | Status: DC

## 2013-10-16 ENCOUNTER — Other Ambulatory Visit: Payer: Self-pay | Admitting: Internal Medicine

## 2013-10-19 ENCOUNTER — Telehealth: Payer: Self-pay | Admitting: *Deleted

## 2013-10-19 MED ORDER — MECLIZINE HCL 25 MG PO TABS: ORAL_TABLET | ORAL | Status: DC

## 2013-10-19 NOTE — Telephone Encounter (Signed)
NEW PHARMACY= [facility]  [place]    REFILL= MECLIZINE 25MG 

## 2013-10-28 ENCOUNTER — Encounter: Payer: Self-pay | Admitting: Internal Medicine

## 2013-10-30 ENCOUNTER — Encounter: Payer: Self-pay | Admitting: Gastroenterology

## 2013-11-07 NOTE — Progress Notes (Signed)
Patient ID: Jason Dennis, male   DOB: Jun 04, 1950, 63 y.o.   MRN: [id number]  Annual Screening Comprehensive Examination  This very nice 63 y.o.MWM presents for complete physical.  Patient has been followed for HTN,  Prediabetes, OSA/CPAP,  Hyperlipidemia, and Vitamin D Deficiency.   Patient has labile HTN predates since 2006 and which has been monitored expectantly. Patient's BP has been controlled at home. Today's BP: 136/82 mmHg. Patient denies any cardiac symptoms as chest pain, palpitations, shortness of breath, dizziness or ankle swelling.   Patient's hyperlipidemia is controlled with diet and medications. Patient denies myalgias or other medication SE's. Last lipids were at goal - Chol 160; HDL  59; LDL 87; Trig 68 on 02/2014.   Patient has prediabetes since May 2013 with A1c of 5.9% and patient denies reactive hypoglycemic symptoms, visual blurring, diabetic polys or paresthesias. Last A1c was 5.8% on 03/06/2013.   Finally, patient has history of Vitamin D Deficiency of 31 in 2008 and last vitamin D was 71 on 03/06/2013   Medication Sig  . aspirin 81 MG tab Take 81 mg by mouth every other day.   Marland Kitchen atorvastatin  80 MG tab   . VITAMIN D 1000 UNITS tab Take 5,000 Units by mouth daily.   . meclizine (ANTIVERT) 25 MG tab TAKE ONE TAB x3 DAILY AS NEEDED  . meloxicam (MOBIC) 15 MG tab TAKE ONE TAB DAILY AFTER A MEAL  . FISH OIL 1000 MG CAP Take by mouth.  Marland Kitchen omeprazole  20 MG cap Take 20 mg by mouth daily.  . phentermine 37.5 MG tab Take 1/2 to 1 tablet daily for dieting & weight loss  . triamcinolone crm 0.1 % Apply 1 application topically 2 (two) times daily.   Allergies  Allergen Reactions  . Cialis [Tadalafil]    Past Medical History  Diagnosis Date  . Hyperlipidemia   . Hypertension     labile  . GERD (gastroesophageal reflux disease)   . Prediabetes   . Vitamin D deficiency   . OSA (obstructive sleep apnea)     On CPAP 10 cm  . Obesity    Past Surgical History  Procedure  Laterality Date  . Spine surgery      [phone]   Family History  Problem Relation Age of Onset  . Hypertension Mother   . Heart disease Mother   . COPD Mother   . Heart failure Mother   . Cancer Father     colon  . Heart failure Father   . Diabetes Brother    History   Social History  . Marital Status: Married    Spouse Name: N/A    Number of Children: N/A  . Years of Education: N/A   Occupational History  . Sales   Social History Main Topics  . Smoking status: Former Smoker -- 1.00 packs/day for 32 years    Types: Cigarettes    Quit date: 02/03/1990  . Smokeless tobacco: Never Used  . Alcohol Use: No  . Drug Use: No  . Sexual Activity: Not Currently    ROS Constitutional: Denies fever, chills, weight loss/gain, headaches, insomnia, fatigue, night sweats or change in appetite. Eyes: Denies redness, blurred vision, diplopia, discharge, itchy or watery eyes.  ENT: Denies discharge, congestion, post nasal drip, epistaxis, sore throat, earache, hearing loss, dental pain, Tinnitus, Vertigo, Sinus pain or snoring.  Cardio: Denies chest pain, palpitations, irregular heartbeat, syncope, dyspnea, diaphoresis, orthopnea, PND, claudication or edema Respiratory: denies cough, dyspnea, DOE, pleurisy, hoarseness, laryngitis  or wheezing.  Gastrointestinal: Denies dysphagia, heartburn, reflux, water brash, pain, cramps, nausea, vomiting, bloating, diarrhea, constipation, hematemesis, melena, hematochezia, jaundice or hemorrhoids Genitourinary: Denies dysuria, frequency, urgency, nocturia, hesitancy, discharge, hematuria or flank pain Musculoskeletal: Denies arthralgia, myalgia, stiffness, Jt. Swelling, pain, limp or strain/sprain. Denies Falls. Skin: Denies puritis, rash, hives, warts, acne, eczema or change in skin lesion Neuro: No weakness, tremor, incoordination, spasms, paresthesia or pain Psychiatric: Denies confusion, memory loss or sensory loss. Denies Depression. Endocrine:  Denies change in weight, skin, hair change, nocturia, and paresthesia, diabetic polys, visual blurring or hyper / hypo glycemic episodes.  Heme/Lymph: No excessive bleeding, bruising or enlarged lymph nodes.   Physical Exam  BP 136/82  Pulse 72  Temp 97.9 F   Resp 16  Ht 5\' 11"    Wt 213 lb 12.8 oz   BMI 29.83  General Appearance: Well nourished, in no apparent distress. Eyes: PERRLA, EOMs, conjunctiva no swelling or erythema, normal fundi and vessels. Sinuses: No frontal/maxillary tenderness ENT/Mouth: EACs patent / TMs  nl. Nares clear without erythema, swelling, mucoid exudates. Oral hygiene is good. No erythema, swelling, or exudate. Tongue normal, non-obstructing. Tonsils not swollen or erythematous. Hearing normal.  Neck: Supple, thyroid normal. No bruits, nodes or JVD. Respiratory: Respiratory effort normal.  BS equal and clear bilateral without rales, rhonci, wheezing or stridor. Cardio: Heart sounds are normal with regular rate and rhythm and no murmurs, rubs or gallops. Peripheral pulses are normal and equal bilaterally without edema. No aortic or femoral bruits. Chest: symmetric with normal excursions and percussion.  Abdomen: Flat, soft, with bowl sounds. Nontender, no guarding, rebound, hernias, masses, or organomegaly.  Lymphatics: Non tender without lymphadenopathy.  Genitourinary: No hernias.Testes nl. DRE - prostate nl for age - smooth & firm w/o nodules. Musculoskeletal: Full ROM all peripheral extremities, joint stability, 5/5 strength, and normal gait. Skin: Warm and dry without rashes, lesions, cyanosis, clubbing or  ecchymosis.  Neuro: Cranial nerves intact, reflexes equal bilaterally. Normal muscle tone, no cerebellar symptoms. Sensation intact.  Pysch: Awake and oriented X 3with normal affect, insight and judgment appropriate.  Assessment and Plan  1. Annual Screening Examination 2. Hypertension  3. Hyperlipidemia 4. Pre Diabetes 5. Vitamin D  Deficiency  Continue prudent diet as discussed, weight control, BP monitoring, regular exercise, and medications as discussed.  Discussed med effects and SE's. Routine screening labs and tests as requested with regular follow-up as recommended.

## 2013-11-10 ENCOUNTER — Encounter: Payer: Self-pay | Admitting: Internal Medicine

## 2013-11-10 ENCOUNTER — Ambulatory Visit (INDEPENDENT_AMBULATORY_CARE_PROVIDER_SITE_OTHER): Payer: BC Managed Care – PPO | Admitting: Internal Medicine

## 2013-11-10 VITALS — BP 136/82 | HR 72 | Temp 97.9°F | Resp 16 | Ht 71.0 in | Wt 213.8 lb

## 2013-11-10 DIAGNOSIS — E559 Vitamin D deficiency, unspecified: Secondary | ICD-10-CM

## 2013-11-10 DIAGNOSIS — Z111 Encounter for screening for respiratory tuberculosis: Secondary | ICD-10-CM

## 2013-11-10 DIAGNOSIS — R7401 Elevation of levels of liver transaminase levels: Secondary | ICD-10-CM

## 2013-11-10 DIAGNOSIS — I10 Essential (primary) hypertension: Secondary | ICD-10-CM

## 2013-11-10 DIAGNOSIS — R740 Nonspecific elevation of levels of transaminase and lactic acid dehydrogenase [LDH]: Secondary | ICD-10-CM

## 2013-11-10 DIAGNOSIS — Z0000 Encounter for general adult medical examination without abnormal findings: Secondary | ICD-10-CM

## 2013-11-10 DIAGNOSIS — Z125 Encounter for screening for malignant neoplasm of prostate: Secondary | ICD-10-CM

## 2013-11-10 DIAGNOSIS — R7402 Elevation of levels of lactic acid dehydrogenase (LDH): Secondary | ICD-10-CM

## 2013-11-10 DIAGNOSIS — R7309 Other abnormal glucose: Secondary | ICD-10-CM

## 2013-11-10 DIAGNOSIS — Z113 Encounter for screening for infections with a predominantly sexual mode of transmission: Secondary | ICD-10-CM

## 2013-11-10 DIAGNOSIS — E782 Mixed hyperlipidemia: Secondary | ICD-10-CM

## 2013-11-10 DIAGNOSIS — Z1212 Encounter for screening for malignant neoplasm of rectum: Secondary | ICD-10-CM

## 2013-11-10 LAB — CBC WITH DIFFERENTIAL/PLATELET
BASOS PCT: 1 % (ref 0–1)
Basophils Absolute: 0 10*3/uL (ref 0.0–0.1)
EOS PCT: 8 % — AB (ref 0–5)
Eosinophils Absolute: 0.3 10*3/uL (ref 0.0–0.7)
HCT: 40.8 % (ref 39.0–52.0)
Hemoglobin: 13.9 g/dL (ref 13.0–17.0)
LYMPHS ABS: 0.9 10*3/uL (ref 0.7–4.0)
Lymphocytes Relative: 24 % (ref 12–46)
MCH: 30 pg (ref 26.0–34.0)
MCHC: 34.1 g/dL (ref 30.0–36.0)
MCV: 87.9 fL (ref 78.0–100.0)
Monocytes Absolute: 0.3 10*3/uL (ref 0.1–1.0)
Monocytes Relative: 7 % (ref 3–12)
Neutro Abs: 2.3 10*3/uL (ref 1.7–7.7)
Neutrophils Relative %: 60 % (ref 43–77)
Platelets: 231 10*3/uL (ref 150–400)
RBC: 4.64 MIL/uL (ref 4.22–5.81)
RDW: 13 % (ref 11.5–15.5)
WBC: 3.9 10*3/uL — ABNORMAL LOW (ref 4.0–10.5)

## 2013-11-10 LAB — TESTOSTERONE: Testosterone: 288 ng/dL — ABNORMAL LOW (ref 300–890)

## 2013-11-10 LAB — URINALYSIS, MICROSCOPIC ONLY
BACTERIA UA: NONE SEEN
Casts: NONE SEEN
Crystals: NONE SEEN
RBC / HPF: NONE SEEN RBC/hpf (ref <3)
Squamous Epithelial / LPF: NONE SEEN
WBC, UA: NONE SEEN WBC/hpf (ref <3)

## 2013-11-10 LAB — HIV ANTIBODY (ROUTINE TESTING W REFLEX): HIV: NONREACTIVE

## 2013-11-10 LAB — HEPATITIS B SURFACE ANTIBODY,QUALITATIVE: HEP B S AB: NEGATIVE

## 2013-11-10 LAB — VITAMIN B12: Vitamin B-12: 392 pg/mL (ref 211–911)

## 2013-11-10 LAB — HEPATITIS A ANTIBODY, TOTAL: HEP A TOTAL AB: NONREACTIVE

## 2013-11-10 LAB — HEPATITIS B CORE ANTIBODY, TOTAL: Hep B Core Total Ab: NONREACTIVE

## 2013-11-10 LAB — HEMOGLOBIN A1C
HEMOGLOBIN A1C: 5.8 % — AB (ref <5.7)
MEAN PLASMA GLUCOSE: 120 mg/dL — AB (ref <117)

## 2013-11-10 LAB — HEPATITIS C ANTIBODY: HCV Ab: NEGATIVE

## 2013-11-10 LAB — RPR

## 2013-11-10 LAB — MICROALBUMIN / CREATININE URINE RATIO
Creatinine, Urine: 177.5 mg/dL
MICROALB UR: 0.68 mg/dL (ref 0.00–1.89)
MICROALB/CREAT RATIO: 3.8 mg/g (ref 0.0–30.0)

## 2013-11-10 LAB — TSH: TSH: 1.077 u[IU]/mL (ref 0.350–4.500)

## 2013-11-11 LAB — LIPID PANEL
CHOLESTEROL: 152 mg/dL (ref 0–200)
HDL: 49 mg/dL (ref >39)
LDL Cholesterol: 82 mg/dL (ref 0–99)
Total CHOL/HDL Ratio: 3.1 Ratio
Triglycerides: 106 mg/dL (ref <150)
VLDL: 21 mg/dL (ref 0–40)

## 2013-11-11 LAB — BASIC METABOLIC PANEL WITH GFR
BUN: 17 mg/dL (ref 6–23)
CALCIUM: 9.3 mg/dL (ref 8.4–10.5)
CO2: 24 meq/L (ref 19–32)
CREATININE: 0.96 mg/dL (ref 0.50–1.35)
Chloride: 105 mEq/L (ref 96–112)
GFR, Est Non African American: 84 mL/min
GLUCOSE: 134 mg/dL — AB (ref 70–99)
Potassium: 4 mEq/L (ref 3.5–5.3)
Sodium: 137 mEq/L (ref 135–145)

## 2013-11-11 LAB — VITAMIN D 25 HYDROXY (VIT D DEFICIENCY, FRACTURES): VIT D 25 HYDROXY: 70 ng/mL (ref 30–89)

## 2013-11-11 LAB — HEPATIC FUNCTION PANEL
ALT: 29 U/L (ref 0–53)
AST: 21 U/L (ref 0–37)
Albumin: 4.3 g/dL (ref 3.5–5.2)
Alkaline Phosphatase: 78 U/L (ref 39–117)
Bilirubin, Direct: 0.2 mg/dL (ref 0.0–0.3)
Indirect Bilirubin: 0.7 mg/dL (ref 0.2–1.2)
Total Bilirubin: 0.9 mg/dL (ref 0.2–1.2)
Total Protein: 6.3 g/dL (ref 6.0–8.3)

## 2013-11-11 LAB — INSULIN, FASTING: INSULIN FASTING, SERUM: 30.6 u[IU]/mL — AB (ref 2.0–19.6)

## 2013-11-11 LAB — MAGNESIUM: MAGNESIUM: 1.9 mg/dL (ref 1.5–2.5)

## 2013-11-11 LAB — PSA: PSA: 1.24 ng/mL (ref <=4.00)

## 2013-11-12 LAB — HEPATITIS B E ANTIBODY: Hepatitis Be Antibody: NONREACTIVE

## 2013-11-13 LAB — TB SKIN TEST
Induration: 0 mm
TB Skin Test: NEGATIVE

## 2013-11-22 ENCOUNTER — Other Ambulatory Visit: Payer: Self-pay | Admitting: Internal Medicine

## 2014-01-12 ENCOUNTER — Encounter: Payer: Self-pay | Admitting: Gastroenterology

## 2014-02-08 ENCOUNTER — Ambulatory Visit: Payer: BC Managed Care – PPO | Admitting: Internal Medicine

## 2014-02-08 NOTE — Progress Notes (Signed)
Patient ID: Jason Dennis, male   DOB: Oct 13, 1950, 63 y.o.   MRN: [id number]  R E S C [facility] E D U L E D

## 2014-02-19 ENCOUNTER — Encounter: Payer: Self-pay | Admitting: Physician Assistant

## 2014-02-19 ENCOUNTER — Ambulatory Visit (INDEPENDENT_AMBULATORY_CARE_PROVIDER_SITE_OTHER): Payer: BC Managed Care – PPO | Admitting: Physician Assistant

## 2014-02-19 VITALS — BP 132/70 | HR 98 | Temp 98.1°F | Resp 16 | Ht 71.0 in | Wt 210.0 lb

## 2014-02-19 DIAGNOSIS — L299 Pruritus, unspecified: Secondary | ICD-10-CM

## 2014-02-19 DIAGNOSIS — E669 Obesity, unspecified: Secondary | ICD-10-CM

## 2014-02-19 DIAGNOSIS — E559 Vitamin D deficiency, unspecified: Secondary | ICD-10-CM

## 2014-02-19 DIAGNOSIS — R7303 Prediabetes: Secondary | ICD-10-CM

## 2014-02-19 DIAGNOSIS — E785 Hyperlipidemia, unspecified: Secondary | ICD-10-CM

## 2014-02-19 DIAGNOSIS — I10 Essential (primary) hypertension: Secondary | ICD-10-CM

## 2014-02-19 DIAGNOSIS — R7309 Other abnormal glucose: Secondary | ICD-10-CM

## 2014-02-19 DIAGNOSIS — F411 Generalized anxiety disorder: Secondary | ICD-10-CM

## 2014-02-19 DIAGNOSIS — R21 Rash and other nonspecific skin eruption: Secondary | ICD-10-CM

## 2014-02-19 LAB — FERRITIN: Ferritin: 215 ng/mL (ref 22–322)

## 2014-02-19 LAB — IRON AND TIBC
%SAT: 23 % (ref 20–55)
IRON: 75 ug/dL (ref 42–165)
TIBC: 320 ug/dL (ref 215–435)
UIBC: 245 ug/dL (ref 125–400)

## 2014-02-19 LAB — URIC ACID: Uric Acid, Serum: 6.2 mg/dL (ref 4.0–7.8)

## 2014-02-19 LAB — CBC WITH DIFFERENTIAL/PLATELET
Basophils Absolute: 0 10*3/uL (ref 0.0–0.1)
Basophils Relative: 0 % (ref 0–1)
Eosinophils Absolute: 0.4 10*3/uL (ref 0.0–0.7)
Eosinophils Relative: 8 % — ABNORMAL HIGH (ref 0–5)
HCT: 45.7 % (ref 39.0–52.0)
Hemoglobin: 15.4 g/dL (ref 13.0–17.0)
Lymphocytes Relative: 20 % (ref 12–46)
Lymphs Abs: 1.1 10*3/uL (ref 0.7–4.0)
MCH: 30.4 pg (ref 26.0–34.0)
MCHC: 33.7 g/dL (ref 30.0–36.0)
MCV: 90.1 fL (ref 78.0–100.0)
MPV: 9.6 fL (ref 9.4–12.4)
Monocytes Absolute: 0.5 10*3/uL (ref 0.1–1.0)
Monocytes Relative: 9 % (ref 3–12)
Neutro Abs: 3.5 10*3/uL (ref 1.7–7.7)
Neutrophils Relative %: 63 % (ref 43–77)
Platelets: 263 10*3/uL (ref 150–400)
RBC: 5.07 MIL/uL (ref 4.22–5.81)
RDW: 12.7 % (ref 11.5–15.5)
WBC: 5.6 10*3/uL (ref 4.0–10.5)

## 2014-02-19 LAB — HEMOGLOBIN A1C
Hgb A1c MFr Bld: 5.8 % — ABNORMAL HIGH (ref <5.7)
Mean Plasma Glucose: 120 mg/dL — ABNORMAL HIGH (ref <117)

## 2014-02-19 LAB — LIPID PANEL
Cholesterol: 156 mg/dL (ref 0–200)
HDL: 48 mg/dL (ref >39)
LDL Cholesterol: 86 mg/dL (ref 0–99)
Total CHOL/HDL Ratio: 3.3 Ratio
Triglycerides: 108 mg/dL (ref <150)
VLDL: 22 mg/dL (ref 0–40)

## 2014-02-19 LAB — HEPATIC FUNCTION PANEL
ALK PHOS: 115 U/L (ref 39–117)
ALT: 33 U/L (ref 0–53)
AST: 21 U/L (ref 0–37)
Albumin: 4.2 g/dL (ref 3.5–5.2)
Bilirubin, Direct: 0.1 mg/dL (ref 0.0–0.3)
Indirect Bilirubin: 0.5 mg/dL (ref 0.2–1.2)
Total Bilirubin: 0.6 mg/dL (ref 0.2–1.2)
Total Protein: 6.3 g/dL (ref 6.0–8.3)

## 2014-02-19 LAB — BASIC METABOLIC PANEL WITH GFR
BUN: 17 mg/dL (ref 6–23)
CO2: 26 mEq/L (ref 19–32)
Calcium: 9.5 mg/dL (ref 8.4–10.5)
Chloride: 105 mEq/L (ref 96–112)
Creat: 0.92 mg/dL (ref 0.50–1.35)
GFR, Est African American: >89 mL/min
GFR, Est Non African American: 88 mL/min
Glucose, Bld: 97 mg/dL (ref 70–99)
Potassium: 4.6 mEq/L (ref 3.5–5.3)
Sodium: 141 mEq/L (ref 135–145)

## 2014-02-19 LAB — TSH: TSH: 2.06 u[IU]/mL (ref 0.350–4.500)

## 2014-02-19 LAB — MAGNESIUM: Magnesium: 2 mg/dL (ref 1.5–2.5)

## 2014-02-19 MED ORDER — PERMETHRIN 5 % EX CREA: 1.0000 "application " | TOPICAL_CREAM | Freq: Once | CUTANEOUS | Status: DC

## 2014-02-19 MED ORDER — HYDROXYZINE HCL 25 MG PO TABS: 25.0000 mg | ORAL_TABLET | Freq: Two times a day (BID) | ORAL | Status: DC | PRN

## 2014-02-19 NOTE — Progress Notes (Signed)
Assessment and Plan:  Hypertension: Continue medication, monitor blood pressure at home. Continue DASH diet.  Reminder to go to the ER if any CP, SOB, nausea, dizziness, severe HA, changes vision/speech, left arm numbness and tingling, and jaw pain. Cholesterol: Continue diet and exercise. Check cholesterol.  Pre-diabetes-Continue diet and exercise. Check A1C Vitamin D Def- check level and continue medications.  Pruitis-? Anxiety- worse with resting- will treat with scabies medication since groupings and questionable linear pattern, declines prednisone, zyrtec, atarax at night- may benefit from SSRI/derm referral.   Continue diet and meds as discussed. Further disposition pending results of labs.  HPI 63 y.o. male  presents for 3 month follow up with hypertension, hyperlipidemia, prediabetes and vitamin D. His blood pressure has been controlled at home, today their BP is BP: 132/70 mmHg He does not workout. He denies chest pain, shortness of breath, dizziness.  He is on cholesterol medication, lipitor 80mg  whole pill and denies myalgias. His cholesterol is at goal. The cholesterol last visit was:   Lab Results  Component Value Date   CHOL 152 11/10/2013   HDL 49 11/10/2013   LDLCALC 82 11/10/2013   TRIG 106 11/10/2013   CHOLHDL 3.1 11/10/2013   He has been working on diet and exercise for prediabetes, and denies paresthesia of the feet, polydipsia, polyuria and visual disturbances. Last A1C in the office was:  Lab Results  Component Value Date   HGBA1C 5.8* 11/10/2013   Patient is on Vitamin D supplement.   Lab Results  Component Value Date   VD25OH 38 11/10/2013     He states that his biggest complaint is diffuse itching, was first seen in March 2015, he has been on several rounds of prednisone which helps at the time, and triamcinolone cream helps some. It is on his trunk/back, and has moved down to this legs and tops of his feet. Worse with resting, better when he is busy. He  states he stopped all of his medication except the bASA and lipitor for 2 weeks and this did not help. He had + RMSF in March, treated doxy BID 1 month,  Kidney/liver normal, + eosinophilia on CBC, has had negative ANA, anti DNA, sed rat and complement. 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   BMI is Body mass index is 29.3 kg/(m^2)., he is working on diet and exercise, was given phentermine 37.5mg  and has been on 1/2 pill and states that it has not been helping. . Wt Readings from Last 3 Encounters:  02/19/14 210 lb (95.255 kg)  11/10/13 213 lb 12.8 oz (96.979 kg)  08/21/13 211 lb 6.4 oz (95.89 kg)    Current Medications:  Current Outpatient Prescriptions on File Prior to Visit  Medication Sig Dispense Refill  . aspirin 81 MG tablet Take 81 mg by mouth every other day.     Marland Kitchen atorvastatin (LIPITOR) 80 MG tablet TAKE ONE TABLET BY MOUTH EVERY DAY FOR CHOLESTEROL 90 tablet 99  . cholecalciferol (VITAMIN D) 1000 UNITS tablet Take 5,000 Units by mouth daily.     . meclizine (ANTIVERT) 25 MG tablet TAKE ONE TABLET BY MOUTH THREE TIMES DAILY AS NEEDED FOR DIZZINESS OR VERTIGO 90 tablet 1  . meloxicam (MOBIC) 15 MG tablet TAKE ONE TABLET BY MOUTH EVERY DAY AFTER A MEAL FOR ACHES AND PAIN 90 tablet 0  . Omega-3 Fatty Acids (FISH OIL) 1000 MG CAPS Take by mouth.    Marland Kitchen omeprazole (PRILOSEC) 20 MG capsule Take  20 mg by mouth daily.    . phentermine (ADIPEX-P) 37.5 MG tablet Take 1/2 to 1 tablet daily for dieting & weight loss 30 tablet 2  . triamcinolone cream (KENALOG) 0.1 % Apply 1 application topically 2 (two) times daily. 85.2 g 2   No current facility-administered medications on file prior to visit.   Medical History:  Past Medical History  Diagnosis Date  . Hyperlipidemia   . Hypertension     labile  . GERD (gastroesophageal reflux disease)   . Prediabetes   . Vitamin D deficiency   . OSA (obstructive sleep apnea)     On CPAP 10 cm  . Obesity    Allergies:   Allergies  Allergen Reactions  . Cialis [Tadalafil]     Review of Systems:  Review of Systems  Constitutional: Negative.   HENT: Negative.   Respiratory: Negative.   Cardiovascular: Negative.   Gastrointestinal: Negative.   Genitourinary: Negative.   Musculoskeletal: Negative.   Skin: Positive for itching and rash.  Neurological: Positive for dizziness (vertigo, has had for years and takes meclizine PRN). Negative for tingling, tremors, sensory change, speech change, focal weakness, seizures and loss of consciousness.  Psychiatric/Behavioral: Negative.     Family history- Review and unchanged Social history- Review and unchanged Physical Exam: BP 132/70 mmHg  Pulse 98  Temp(Src) 98.1 F (36.7 C)  Resp 16  Ht 5\' 11"  (1.803 m)  Wt 210 lb (95.255 kg)  BMI 29.30 kg/m2 Wt Readings from Last 3 Encounters:  02/19/14 210 lb (95.255 kg)  11/10/13 213 lb 12.8 oz (96.979 kg)  08/21/13 211 lb 6.4 oz (95.89 kg)   General Appearance: Well nourished, in no apparent distress. Eyes: PERRLA, EOMs, conjunctiva no swelling or erythema Sinuses: No Frontal/maxillary tenderness ENT/Mouth: Ext aud canals clear, TMs without erythema, bulging. No erythema, swelling, or exudate on post pharynx.  Tonsils not swollen or erythematous. Hearing normal.  Neck: Supple, thyroid normal.  Respiratory: Respiratory effort normal, BS equal bilaterally without rales, rhonchi, wheezing or stridor.  Cardio: RRR with no MRGs. Brisk peripheral pulses without edema.  Abdomen: Soft, + BS.  Non tender, no guarding, rebound, hernias, masses. Lymphatics: Non tender without lymphadenopathy.  Musculoskeletal: Full ROM, 5/5 strength, normal gait.  Skin: Left lower back/hip, right upper arm, bilateral legs with papules/excoriations/flat macules. Warm, dry without  lesions, ecchymosis.  Neuro: Cranial nerves intact. Normal muscle tone, no cerebellar symptoms. Sensation intact.  Psych: Awake and oriented X 3, normal affect,  Insight and Judgment appropriate.    Vicie Mutters, PA-C 9:16 AM [facility]

## 2014-02-19 NOTE — Patient Instructions (Signed)
Please take the atarax and the zyrtec at night Work very hard to NOT scratch the areas Use the cream from neck down once.  If it gets better will discuss at next visit, if not will send to Derm to evaluate.  Zyrtec or certizine at night because it can make you sleepy Cheapest at [employer], [employer], [employer]      Bad carbs also include fruit juice, alcohol, and sweet tea. These are empty calories that do not signal to your brain that you are full.   Please remember the good carbs are still carbs which convert into sugar. So please measure them out no more than 1/2-1 cup of rice, oatmeal, pasta, and beans  Veggies are however free foods! Pile them on.   Not all fruit is created equal. Please see the list below, the fruit at the bottom is higher in sugars than the fruit at the top. Please avoid all dried fruits.      Pruritus  Pruritus is an itch. There are many different problems that can cause an itch. Dry skin is one of the most common causes of itching. Most cases of itching do not require medical attention.  HOME CARE INSTRUCTIONS  Make sure your skin is moistened on a regular basis. A moisturizer that contains petroleum jelly is best for keeping moisture in your skin. If you develop a rash, you may try the following for relief:   Use corticosteroid cream.  Apply cool compresses to the affected areas.  Bathe with Epsom salts or baking soda in the bathwater.  Soak in colloidal oatmeal baths. These are available at your pharmacy.  Apply baking soda paste to the rash. Stir water into baking soda until it reaches a paste-like consistency.  Use an anti-itch lotion.  Take over-the-counter diphenhydramine medicine by mouth as the instructions direct.  Avoid scratching. Scratching may cause the rash to become infected. If itching is very bad, your caregiver may suggest prescription lotions or creams to lessen your symptoms.  Avoid hot showers, which can make itching worse. A cold shower  may help with itching as long as you use a moisturizer after the shower. SEEK MEDICAL CARE IF: The itching does not go away after several days. Document Released: 11/01/2010 Document Revised: 07/06/2013 Document Reviewed: 11/01/2010 [facility] Patient Information 2015 [place], [place]. This information is not intended to replace advice given to you by your health care provider. Make sure you discuss any questions you have with your health care provider.

## 2014-02-20 LAB — SEDIMENTATION RATE: Sed Rate: 1 mm/hr (ref 0–16)

## 2014-02-20 LAB — VITAMIN D 25 HYDROXY (VIT D DEFICIENCY, FRACTURES): VIT D 25 HYDROXY: 53 ng/mL (ref 30–100)

## 2014-02-22 LAB — LYME ABY, WSTRN BLT IGG & IGM W/BANDS
B BURGDORFERI IGM ABS (IB): NEGATIVE
B burgdorferi IgG Abs (IB): NEGATIVE
LYME DISEASE 23 KD IGG: NONREACTIVE
LYME DISEASE 28 KD IGG: NONREACTIVE
LYME DISEASE 39 KD IGM: NONREACTIVE
LYME DISEASE 41 KD IGM: NONREACTIVE
LYME DISEASE 66 KD IGG: NONREACTIVE
LYME DISEASE 93 KD IGG: NONREACTIVE
Lyme Disease 18 kD IgG: NONREACTIVE
Lyme Disease 23 kD IgM: NONREACTIVE
Lyme Disease 30 kD IgG: NONREACTIVE
Lyme Disease 39 kD IgG: NONREACTIVE
Lyme Disease 41 kD IgG: NONREACTIVE
Lyme Disease 45 kD IgG: NONREACTIVE
Lyme Disease 58 kD IgG: NONREACTIVE

## 2014-02-24 LAB — ROCKY MTN SPOTTED FVR ABS PNL(IGG+IGM)
RMSF IGM: 0.13 IV
RMSF IgG: 1.39 IV — ABNORMAL HIGH

## 2014-03-16 ENCOUNTER — Encounter: Payer: Self-pay | Admitting: Gastroenterology

## 2014-04-08 ENCOUNTER — Other Ambulatory Visit: Payer: Self-pay | Admitting: Physician Assistant

## 2014-05-12 ENCOUNTER — Other Ambulatory Visit: Payer: Self-pay | Admitting: Physician Assistant

## 2014-05-28 ENCOUNTER — Ambulatory Visit: Payer: Self-pay | Admitting: Internal Medicine

## 2014-05-29 ENCOUNTER — Other Ambulatory Visit: Payer: Self-pay | Admitting: Physician Assistant

## 2014-06-16 ENCOUNTER — Other Ambulatory Visit: Payer: Self-pay | Admitting: Internal Medicine

## 2014-06-16 DIAGNOSIS — L309 Dermatitis, unspecified: Secondary | ICD-10-CM

## 2014-06-16 DIAGNOSIS — Z9989 Dependence on other enabling machines and devices: Secondary | ICD-10-CM

## 2014-06-16 DIAGNOSIS — G4733 Obstructive sleep apnea (adult) (pediatric): Secondary | ICD-10-CM

## 2014-06-16 MED ORDER — CLOBETASOL PROPIONATE 0.05 % EX CREA: 1.0000 "application " | TOPICAL_CREAM | Freq: Two times a day (BID) | CUTANEOUS | Status: DC

## 2014-07-06 ENCOUNTER — Encounter: Payer: Self-pay | Admitting: Physician Assistant

## 2014-07-06 ENCOUNTER — Ambulatory Visit (INDEPENDENT_AMBULATORY_CARE_PROVIDER_SITE_OTHER): Payer: 59 | Admitting: Physician Assistant

## 2014-07-06 VITALS — BP 132/70 | HR 68 | Temp 97.7°F | Resp 16 | Ht 71.0 in | Wt 219.0 lb

## 2014-07-06 DIAGNOSIS — R7309 Other abnormal glucose: Secondary | ICD-10-CM

## 2014-07-06 DIAGNOSIS — E669 Obesity, unspecified: Secondary | ICD-10-CM

## 2014-07-06 DIAGNOSIS — E785 Hyperlipidemia, unspecified: Secondary | ICD-10-CM

## 2014-07-06 DIAGNOSIS — R7303 Prediabetes: Secondary | ICD-10-CM

## 2014-07-06 DIAGNOSIS — Z79899 Other long term (current) drug therapy: Secondary | ICD-10-CM

## 2014-07-06 DIAGNOSIS — I10 Essential (primary) hypertension: Secondary | ICD-10-CM

## 2014-07-06 DIAGNOSIS — R21 Rash and other nonspecific skin eruption: Secondary | ICD-10-CM

## 2014-07-06 DIAGNOSIS — E559 Vitamin D deficiency, unspecified: Secondary | ICD-10-CM

## 2014-07-06 LAB — CBC WITH DIFFERENTIAL/PLATELET
BASOS ABS: 0 10*3/uL (ref 0.0–0.1)
Basophils Relative: 1 % (ref 0–1)
EOS ABS: 0.4 10*3/uL (ref 0.0–0.7)
Eosinophils Relative: 9 % — ABNORMAL HIGH (ref 0–5)
HCT: 41.7 % (ref 39.0–52.0)
HEMOGLOBIN: 13.8 g/dL (ref 13.0–17.0)
LYMPHS ABS: 1 10*3/uL (ref 0.7–4.0)
Lymphocytes Relative: 24 % (ref 12–46)
MCH: 29.4 pg (ref 26.0–34.0)
MCHC: 33.1 g/dL (ref 30.0–36.0)
MCV: 88.7 fL (ref 78.0–100.0)
MPV: 10.1 fL (ref 8.6–12.4)
Monocytes Absolute: 0.3 10*3/uL (ref 0.1–1.0)
Monocytes Relative: 8 % (ref 3–12)
Neutro Abs: 2.4 10*3/uL (ref 1.7–7.7)
Neutrophils Relative %: 58 % (ref 43–77)
PLATELETS: 232 10*3/uL (ref 150–400)
RBC: 4.7 MIL/uL (ref 4.22–5.81)
RDW: 12.8 % (ref 11.5–15.5)
WBC: 4.2 10*3/uL (ref 4.0–10.5)

## 2014-07-06 MED ORDER — PHENTERMINE HCL 37.5 MG PO TABS: ORAL_TABLET | ORAL | Status: DC

## 2014-07-06 NOTE — Progress Notes (Signed)
Assessment and Plan:  1. Hypertension -Continue medication, monitor blood pressure at home. Continue DASH diet.  Reminder to go to the ER if any CP, SOB, nausea, dizziness, severe HA, changes vision/speech, left arm numbness and tingling and jaw pain.  2. Cholesterol -Continue diet and exercise. Check cholesterol.   3. Prediabetes  -Continue diet and exercise. Check A1C  4. Vitamin D Def - check level and continue medications.   5. Obesity with co morbidities-  long discussion about weight loss, diet, and exercise, will start the patient on phentermine- hand out given and AE's discussed, will do close follow up.   6. Dermatitis-  benadryl, triamcinolone and continue zyrtec, and will refer to Derm for recurrent urticaria   Continue diet and meds as discussed. Further disposition pending results of labs. Over 30 minutes of exam, counseling, chart review, and critical decision making was performed  HPI 64 y.o. male  presents for 3 month follow up on hypertension, cholesterol, prediabetes, and vitamin D deficiency.   His blood pressure has been controlled at home, today their BP is BP: 132/70 mmHg  He does not workout. He denies chest pain, shortness of breath, dizziness.  He is on cholesterol medication and denies myalgias. His cholesterol is at goal. The cholesterol last visit was:   Lab Results  Component Value Date   CHOL 156 02/19/2014   HDL 48 02/19/2014   LDLCALC 86 02/19/2014   TRIG 108 02/19/2014   CHOLHDL 3.3 02/19/2014    He has been working on diet and exercise for prediabetes, and denies paresthesia of the feet, polydipsia, polyuria and visual disturbances. Last A1C in the office was:  Lab Results  Component Value Date   HGBA1C 5.8* 02/19/2014   Patient is on Vitamin D supplement.   Lab Results  Component Value Date   VD25OH 25 02/19/2014     He states that his biggest complaint is diffuse itching, was first seen in March 2015, he has been on several rounds of  prednisone which helps at the time, and triamcinolone cream helps some. It is on his trunk/back, and has moved down to this legs and tops of his feet. Worse with resting, better when he is busy. He states he stopped all of his medication except the bASA and lipitor for 2 weeks and this did not help. He had + RMSF in March, treated doxy BID 1 month, Kidney/liver normal, + eosinophilia on CBC, has had negative ANA, anti DNA, sed rat and complement. 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   BMI is Body mass index is 30.56 kg/(m^2)., he is working on diet and exercise. Has been off phentermine for 6 months and would like to get back.  Wt Readings from Last 3 Encounters:  07/06/14 219 lb (99.338 kg)  02/19/14 210 lb (95.255 kg)  11/10/13 213 lb 12.8 oz (96.979 kg)     Current Medications:  Current Outpatient Prescriptions on File Prior to Visit  Medication Sig Dispense Refill  . aspirin 81 MG tablet Take 81 mg by mouth every other day.     Marland Kitchen atorvastatin (LIPITOR) 80 MG tablet TAKE ONE TABLET BY MOUTH EVERY DAY FOR CHOLESTEROL 90 tablet 99  . cholecalciferol (VITAMIN D) 1000 UNITS tablet Take 5,000 Units by mouth daily.     . clobetasol cream (TEMOVATE) 7.37 % Apply 1 application topically 2 (two) times daily. Apply to rash 2 x day as needed  (Do not apply on face) 60  g 1  . hydrOXYzine (ATARAX/VISTARIL) 25 MG tablet TAKE ONE TABLET BY MOUTH TWICE DAILY AS NEEDED 60 tablet 0  . meclizine (ANTIVERT) 25 MG tablet TAKE ONE TABLET BY MOUTH THREE TIMES DAILY AS NEEDED FOR DIZZINESS OR VERTIGO 90 tablet 0  . meloxicam (MOBIC) 15 MG tablet TAKE ONE TABLET BY MOUTH EVERY DAY AFTER A MEAL FOR ACHES AND PAIN 90 tablet 0  . Omega-3 Fatty Acids (FISH OIL) 1000 MG CAPS Take by mouth.    Marland Kitchen omeprazole (PRILOSEC) 20 MG capsule Take 20 mg by mouth daily.    . permethrin (ELIMITE) 5 % cream Apply 1 application topically once. 60 g 1  . phentermine (ADIPEX-P) 37.5 MG tablet Take 1/2 to 1  tablet daily for dieting & weight loss 30 tablet 2  . triamcinolone cream (KENALOG) 0.1 % Apply 1 application topically 2 (two) times daily. 85.2 g 2   No current facility-administered medications on file prior to visit.   Medical History:  Past Medical History  Diagnosis Date  . Hyperlipidemia   . Hypertension     labile  . GERD (gastroesophageal reflux disease)   . Prediabetes   . Vitamin D deficiency   . OSA (obstructive sleep apnea)     On CPAP 10 cm  . Obesity    Allergies:  Allergies  Allergen Reactions  . Cialis [Tadalafil]     Review of Systems:  Review of Systems  Constitutional: Negative.   HENT: Negative.   Eyes: Negative.   Respiratory: Negative.   Cardiovascular: Negative.   Gastrointestinal: Negative.   Genitourinary: Negative.   Musculoskeletal: Negative.   Skin: Positive for itching and rash.  Neurological: Negative.   Endo/Heme/Allergies: Negative.   Psychiatric/Behavioral: Negative.     Family history- Review and unchanged Social history- Review and unchanged Physical Exam: BP 132/70 mmHg  Pulse 68  Temp(Src) 97.7 F (36.5 C)  Resp 16  Ht 5\' 11"  (1.803 m)  Wt 219 lb (99.338 kg)  BMI 30.56 kg/m2 Wt Readings from Last 3 Encounters:  07/06/14 219 lb (99.338 kg)  02/19/14 210 lb (95.255 kg)  11/10/13 213 lb 12.8 oz (96.979 kg)   General Appearance: Well nourished, in no apparent distress. Eyes: PERRLA, EOMs, conjunctiva no swelling or erythema Sinuses: No Frontal/maxillary tenderness ENT/Mouth: Ext aud canals clear, TMs without erythema, bulging. No erythema, swelling, or exudate on post pharynx.  Tonsils not swollen or erythematous. Hearing normal.  Neck: Supple, thyroid normal.  Respiratory: Respiratory effort normal, BS equal bilaterally without rales, rhonchi, wheezing or stridor.  Cardio: RRR with no MRGs. Brisk peripheral pulses without edema.  Abdomen: Soft, + BS,  Non tender, no guarding, rebound, hernias, masses. Lymphatics: Non  tender without lymphadenopathy.  Musculoskeletal: Full ROM, 5/5 strength, Normal gait Skin: + urticaria on left trunk/back.  Warm, dry without lesions, ecchymosis.  Neuro: Cranial nerves intact. Normal muscle tone, no cerebellar symptoms. Psych: Awake and oriented X 3, normal affect, Insight and Judgment appropriate.    Vicie Mutters, PA-C 9:16 AM [facility]

## 2014-07-06 NOTE — Patient Instructions (Signed)
We have made a referral for you to get imaging or go to another doctor. We will try to get this as soon as possible but please understand that we often need to get approval with your insurance before we can schedule and not every office can accommodate you quickly. So please allow 5-7 business days for us to call you back about the referral.      Bad carbs also include fruit juice, alcohol, and sweet tea. These are empty calories that do not signal to your brain that you are full.   Please remember the good carbs are still carbs which convert into sugar. So please measure them out no more than 1/2-1 cup of rice, oatmeal, pasta, and beans  Veggies are however free foods! Pile them on.   Not all fruit is created equal. Please see the list below, the fruit at the bottom is higher in sugars than the fruit at the top. Please avoid all dried fruits.     

## 2014-07-07 LAB — HEPATIC FUNCTION PANEL
ALBUMIN: 4.3 g/dL (ref 3.5–5.2)
ALT: 37 U/L (ref 0–53)
AST: 22 U/L (ref 0–37)
Alkaline Phosphatase: 70 U/L (ref 39–117)
BILIRUBIN DIRECT: 0.2 mg/dL (ref 0.0–0.3)
Indirect Bilirubin: 0.7 mg/dL (ref 0.2–1.2)
Total Bilirubin: 0.9 mg/dL (ref 0.2–1.2)
Total Protein: 6.2 g/dL (ref 6.0–8.3)

## 2014-07-07 LAB — MAGNESIUM: MAGNESIUM: 1.9 mg/dL (ref 1.5–2.5)

## 2014-07-07 LAB — BASIC METABOLIC PANEL WITH GFR
BUN: 16 mg/dL (ref 6–23)
CALCIUM: 9.3 mg/dL (ref 8.4–10.5)
CO2: 28 mEq/L (ref 19–32)
Chloride: 105 mEq/L (ref 96–112)
Creat: 0.9 mg/dL (ref 0.50–1.35)
GFR, Est African American: >89 mL/min
GFR, Est Non African American: >89 mL/min
Glucose, Bld: 98 mg/dL (ref 70–99)
POTASSIUM: 4.5 meq/L (ref 3.5–5.3)
SODIUM: 140 meq/L (ref 135–145)

## 2014-07-07 LAB — LIPID PANEL
CHOL/HDL RATIO: 3.6 ratio
CHOLESTEROL: 142 mg/dL (ref 0–200)
HDL: 40 mg/dL (ref >=40)
LDL Cholesterol: 76 mg/dL (ref 0–99)
Triglycerides: 131 mg/dL (ref <150)
VLDL: 26 mg/dL (ref 0–40)

## 2014-07-07 LAB — HEMOGLOBIN A1C
Hgb A1c MFr Bld: 5.8 % — ABNORMAL HIGH (ref <5.7)
Mean Plasma Glucose: 120 mg/dL — ABNORMAL HIGH (ref <117)

## 2014-07-07 LAB — TSH: TSH: 2.154 u[IU]/mL (ref 0.350–4.500)

## 2014-07-07 LAB — INSULIN, FASTING: Insulin fasting, serum: 9.8 u[IU]/mL (ref 2.0–19.6)

## 2014-07-07 LAB — VITAMIN D 25 HYDROXY (VIT D DEFICIENCY, FRACTURES): Vit D, 25-Hydroxy: 55 ng/mL (ref 30–100)

## 2014-07-19 ENCOUNTER — Other Ambulatory Visit: Payer: Self-pay | Admitting: Physician Assistant

## 2014-07-23 ENCOUNTER — Ambulatory Visit: Payer: Self-pay | Admitting: Internal Medicine

## 2014-07-30 ENCOUNTER — Other Ambulatory Visit: Payer: Self-pay | Admitting: Internal Medicine

## 2014-08-13 ENCOUNTER — Encounter: Payer: Self-pay | Admitting: Internal Medicine

## 2014-08-13 ENCOUNTER — Ambulatory Visit (INDEPENDENT_AMBULATORY_CARE_PROVIDER_SITE_OTHER): Payer: 59 | Admitting: Internal Medicine

## 2014-08-13 VITALS — BP 124/76 | HR 84 | Temp 98.2°F | Resp 18 | Ht 71.0 in | Wt 214.0 lb

## 2014-08-13 DIAGNOSIS — L02414 Cutaneous abscess of left upper limb: Secondary | ICD-10-CM

## 2014-08-13 MED ORDER — DOXYCYCLINE HYCLATE 100 MG PO CAPS: 100.0000 mg | ORAL_CAPSULE | Freq: Two times a day (BID) | ORAL | Status: DC

## 2014-08-13 NOTE — Patient Instructions (Signed)
Abscess °An abscess is an infected area that contains a collection of pus and debris. It can occur in almost any part of the body. An abscess is also known as a furuncle or boil. °CAUSES  °An abscess occurs when tissue gets infected. This can occur from blockage of oil or sweat glands, infection of hair follicles, or a minor injury to the skin. As the body tries to fight the infection, pus collects in the area and creates pressure under the skin. This pressure causes pain. People with weakened immune systems have difficulty fighting infections and get certain abscesses more often.  °SYMPTOMS °Usually an abscess develops on the skin and becomes a painful mass that is red, warm, and tender. If the abscess forms under the skin, you may feel a moveable soft area under the skin. Some abscesses break open (rupture) on their own, but most will continue to get worse without care. The infection can spread deeper into the body and eventually into the bloodstream, causing you to feel ill.  °DIAGNOSIS  °Your caregiver will take your medical history and perform a physical exam. A sample of fluid may also be taken from the abscess to determine what is causing your infection. °TREATMENT  °Your caregiver may prescribe antibiotic medicines to fight the infection. However, taking antibiotics alone usually does not cure an abscess. Your caregiver may need to make a small cut (incision) in the abscess to drain the pus. In some cases, gauze is packed into the abscess to reduce pain and to continue draining the area. °HOME CARE INSTRUCTIONS  °· Only take over-the-counter or prescription medicines for pain, discomfort, or fever as directed by your caregiver. °· If you were prescribed antibiotics, take them as directed. Finish them even if you start to feel better. °· If gauze is used, follow your caregiver's directions for changing the gauze. °· To avoid spreading the infection: °· Keep your draining abscess covered with a  bandage. °· Wash your hands well. °· Do not share personal care items, towels, or whirlpools with others. °· Avoid skin contact with others. °· Keep your skin and clothes clean around the abscess. °· Keep all follow-up appointments as directed by your caregiver. °SEEK MEDICAL CARE IF:  °· You have increased pain, swelling, redness, fluid drainage, or bleeding. °· You have muscle aches, chills, or a general ill feeling. °· You have a fever. °MAKE SURE YOU:  °· Understand these instructions. °· Will watch your condition. °· Will get help right away if you are not doing well or get worse. °Document Released: 11/29/2004 Document Revised: 08/21/2011 Document Reviewed: 05/04/2011 °[employer]® Patient Information ©2015 [employer]. This information is not intended to replace advice given to you by your health care provider. Make sure you discuss any questions you have with your health care provider. ° °Abscess °Care After °An abscess (also called a boil or furuncle) is an infected area that contains a collection of pus. Signs and symptoms of an abscess include pain, tenderness, redness, or hardness, or you may feel a moveable soft area under your skin. An abscess can occur anywhere in the body. The infection may spread to surrounding tissues causing cellulitis. A cut (incision) by the surgeon was made over your abscess and the pus was drained out. Gauze may have been packed into the space to provide a drain that will allow the cavity to heal from the inside outwards. The boil may be painful for 5 to 7 days. Most people with a boil do not have   high fevers. Your abscess, if seen early, may not have localized, and may not have been lanced. If not, another appointment may be required for this if it does not get better on its own or with medications. °HOME CARE INSTRUCTIONS  °· Only take over-the-counter or prescription medicines for pain, discomfort, or fever as directed by your caregiver. °· When you bathe, soak and then  remove gauze or iodoform packs at least daily or as directed by your caregiver. You may then wash the wound gently with mild soapy water. Repack with gauze or do as your caregiver directs. °SEEK IMMEDIATE MEDICAL CARE IF:  °· You develop increased pain, swelling, redness, drainage, or bleeding in the wound site. °· You develop signs of generalized infection including muscle aches, chills, fever, or a general ill feeling. °· An oral temperature above 102° F (38.9° C) develops, not controlled by medication. °See your caregiver for a recheck if you develop any of the symptoms described above. If medications (antibiotics) were prescribed, take them as directed. °Document Released: 09/07/2004 Document Revised: 05/14/2011 Document Reviewed: 05/05/2007 °[employer]® Patient Information ©2015 [employer]. This information is not intended to replace advice given to you by your health care provider. Make sure you discuss any questions you have with your health care provider. ° °

## 2014-08-13 NOTE — Progress Notes (Signed)
   Subjective:    Patient ID: Jason Dennis, male    DOB: April 29, 1950, 64 y.o.   MRN: [id number]  Wound Check  Patient presents to the office for evaluation of wound on his arm.  He reports that it started as a bump on Tuesday and has gotten progessively worse.  It started draining this morning.  He has been using peroxide and neosporin.  He reports that he has no injury that he can think of.  He has no history of abscesses.  He has not been on antibiotics recently.  Nothing makes his arm worse.  Mildly painful.    Review of Systems  Constitutional: Negative for fever, chills and fatigue.  Gastrointestinal: Negative for nausea and vomiting.  Skin: Positive for wound.  All other systems reviewed and are negative.      Objective:   Physical Exam  Constitutional: He is oriented to person, place, and time. He appears well-developed and well-nourished. No distress.  HENT:  Head: Normocephalic and atraumatic.  Mouth/Throat: Oropharynx is clear and moist. No oropharyngeal exudate.  Eyes: Conjunctivae are normal. No scleral icterus.  Neck: Normal range of motion. Neck supple. No JVD present. No thyromegaly present.  Cardiovascular: Normal rate, regular rhythm, normal heart sounds and intact distal pulses.  Exam reveals no gallop and no friction rub.   No murmur heard. Pulmonary/Chest: Effort normal and breath sounds normal. No respiratory distress. He has no wheezes. He has no rales. He exhibits no tenderness.  Musculoskeletal: Normal range of motion.  Lymphadenopathy:    He has no cervical adenopathy.  Neurological: He is alert and oriented to person, place, and time.  Skin: He is not diaphoretic.     Psychiatric: He has a normal mood and affect. His behavior is normal. Judgment and thought content normal.  Nursing note and vitals reviewed.  Filed Vitals:   08/13/14 0910  BP: 124/76  Pulse: 84  Temp: 98.2 F (36.8 C)  Resp: 18   I&D:  Wound location: left forearm Wound  cleaned with isopropyl alcohol.  2 cc plain 1% lidocaine injected into the epidermis.  Skin was appropriately numbed.  With an 11 blade 1/2 inch incision was made.  Purulent drainage was observed from the wound.  Wound was bluntly dissected and the wound was cleaned with isopropyl alcohol and dressed with 4x4 gauze and paper tape.    Procedure tolerated with no complications.         Assessment & Plan:    1. Abscess of arm, left -I&D as seen above -mild surrounding cellulitis -doxycycline given -recheck on Monday -Go to ER if systemic signs of infection or increased redness

## 2014-08-16 ENCOUNTER — Ambulatory Visit (INDEPENDENT_AMBULATORY_CARE_PROVIDER_SITE_OTHER): Payer: 59 | Admitting: Internal Medicine

## 2014-08-16 ENCOUNTER — Encounter: Payer: Self-pay | Admitting: Internal Medicine

## 2014-08-16 VITALS — BP 124/72 | HR 66 | Temp 98.4°F | Resp 18 | Ht 71.0 in | Wt 218.0 lb

## 2014-08-16 DIAGNOSIS — L02414 Cutaneous abscess of left upper limb: Secondary | ICD-10-CM

## 2014-08-16 DIAGNOSIS — R7309 Other abnormal glucose: Secondary | ICD-10-CM

## 2014-08-16 DIAGNOSIS — I10 Essential (primary) hypertension: Secondary | ICD-10-CM

## 2014-08-16 MED ORDER — CEFTRIAXONE SODIUM 1 G IJ SOLR
0.5000 g | Freq: Once | INTRAMUSCULAR | Status: AC
  Administered 2014-08-16: 0.5 g via INTRAMUSCULAR

## 2014-08-16 NOTE — Patient Instructions (Signed)

## 2014-08-16 NOTE — Progress Notes (Signed)
   Subjective:    Patient ID: Jason Dennis, male    DOB: 08-17-1950, 64 y.o.   MRN: [id number]  HPI  Patient presents to the office for evaluation of wound recheck. Patient had an abscess and surrounding cellulitis which was I&D'd here in the office with some purulent discharge.  He was given doxycycline and has been taking it.  He has continued to have drainage and using warm compresses.  He reports no more soreness and he feels that the redness is better.     Review of Systems  Constitutional: Negative for fever, chills and fatigue.  Gastrointestinal: Negative for nausea and vomiting.  Skin: Positive for color change and wound.  All other systems reviewed and are negative.      Objective:   Physical Exam  Constitutional: He is oriented to person, place, and time. He appears well-developed and well-nourished. No distress.  HENT:  Head: Normocephalic.  Mouth/Throat: Oropharynx is clear and moist. No oropharyngeal exudate.  Eyes: Conjunctivae are normal. No scleral icterus.  Neck: Normal range of motion. Neck supple. No JVD present. No thyromegaly present.  Cardiovascular: Normal rate, regular rhythm, normal heart sounds and intact distal pulses.  Exam reveals no gallop and no friction rub.   No murmur heard. Pulmonary/Chest: Effort normal and breath sounds normal. No respiratory distress. He has no wheezes. He has no rales. He exhibits no tenderness.  Musculoskeletal: Normal range of motion.  Lymphadenopathy:    He has no cervical adenopathy.  Neurological: He is alert and oriented to person, place, and time.  Skin: Skin is warm and dry. He is not diaphoretic.  3 cm x 2 cm erythematous macular region on the left arm with mild induration and no palpable warmth.  There is a central incision with active purulent drainage.  No tenderness to palpation.    Psychiatric: He has a normal mood and affect. His behavior is normal. Judgment and thought content normal.  Nursing note and vitals  reviewed.         Assessment & Plan:    1. Abscess of left arm -ceftriaxone 500 mg IM injection -cont doxy until gone -let office know if there is a development of systemic symptoms.  -cont warm compresses -recheck in 1 week.

## 2014-08-16 NOTE — Addendum Note (Signed)
Addended by: Tristian Sickinger A on: 08/16/2014 09:12 AM   Modules accepted: Orders

## 2014-08-24 ENCOUNTER — Ambulatory Visit (INDEPENDENT_AMBULATORY_CARE_PROVIDER_SITE_OTHER): Payer: 59 | Admitting: Internal Medicine

## 2014-08-24 VITALS — BP 124/68 | HR 74 | Temp 98.0°F | Resp 18 | Ht 71.0 in | Wt 212.0 lb

## 2014-08-24 DIAGNOSIS — L02414 Cutaneous abscess of left upper limb: Secondary | ICD-10-CM

## 2014-08-24 NOTE — Progress Notes (Signed)
   Subjective:    Patient ID: Jason Dennis, male    DOB: [birth year], 64 y.o.   MRN: [id number]  HPI  Patient returns for recheck of abscess located on his left arm.  He reports that it has gotten much better.  He has noticed the redness coming down and has also noticed that he has no more tenderness to palpation.  He has completed his antibiotic doxycycline.  Review of Systems  Constitutional: Negative for fever, chills and fatigue.  Gastrointestinal: Negative for nausea and vomiting.  Skin: Positive for wound. Negative for color change and rash.       Objective:   Physical Exam  Constitutional: He is oriented to person, place, and time. He appears well-developed and well-nourished. No distress.  HENT:  Head: Normocephalic and atraumatic.  Mouth/Throat: Oropharynx is clear and moist. No oropharyngeal exudate.  Eyes: Conjunctivae are normal. No scleral icterus.  Neck: Normal range of motion. Neck supple. No JVD present. No thyromegaly present.  Cardiovascular: Normal rate, regular rhythm, normal heart sounds and intact distal pulses.  Exam reveals no gallop and no friction rub.   No murmur heard. Pulmonary/Chest: Effort normal and breath sounds normal. No respiratory distress. He has no wheezes. He has no rales. He exhibits no tenderness.  Lymphadenopathy:    He has no cervical adenopathy.  Neurological: He is alert and oriented to person, place, and time.  Skin: Skin is warm and dry. He is not diaphoretic.     Psychiatric: He has a normal mood and affect. His behavior is normal. Judgment and thought content normal.  Nursing note and vitals reviewed.         Assessment & Plan:    1. Abscess of arm, left -abscess improved -no further abx required -patient to return as needed for worsening symptoms.

## 2014-08-25 ENCOUNTER — Other Ambulatory Visit: Payer: Self-pay | Admitting: Internal Medicine

## 2014-11-11 ENCOUNTER — Ambulatory Visit (INDEPENDENT_AMBULATORY_CARE_PROVIDER_SITE_OTHER): Payer: 59 | Admitting: Internal Medicine

## 2014-11-11 ENCOUNTER — Encounter: Payer: Self-pay | Admitting: Internal Medicine

## 2014-11-11 VITALS — BP 126/74 | HR 60 | Temp 98.1°F | Resp 16 | Ht 70.5 in | Wt 218.4 lb

## 2014-11-11 DIAGNOSIS — Z111 Encounter for screening for respiratory tuberculosis: Secondary | ICD-10-CM | POA: Diagnosis not present

## 2014-11-11 DIAGNOSIS — I10 Essential (primary) hypertension: Secondary | ICD-10-CM | POA: Diagnosis not present

## 2014-11-11 DIAGNOSIS — Z23 Encounter for immunization: Secondary | ICD-10-CM | POA: Diagnosis not present

## 2014-11-11 DIAGNOSIS — E785 Hyperlipidemia, unspecified: Secondary | ICD-10-CM

## 2014-11-11 DIAGNOSIS — R7303 Prediabetes: Secondary | ICD-10-CM

## 2014-11-11 DIAGNOSIS — E559 Vitamin D deficiency, unspecified: Secondary | ICD-10-CM

## 2014-11-11 DIAGNOSIS — G4733 Obstructive sleep apnea (adult) (pediatric): Secondary | ICD-10-CM

## 2014-11-11 DIAGNOSIS — R5383 Other fatigue: Secondary | ICD-10-CM

## 2014-11-11 DIAGNOSIS — Z125 Encounter for screening for malignant neoplasm of prostate: Secondary | ICD-10-CM

## 2014-11-11 DIAGNOSIS — Z0000 Encounter for general adult medical examination without abnormal findings: Secondary | ICD-10-CM

## 2014-11-11 DIAGNOSIS — Z9989 Dependence on other enabling machines and devices: Secondary | ICD-10-CM

## 2014-11-11 DIAGNOSIS — K219 Gastro-esophageal reflux disease without esophagitis: Secondary | ICD-10-CM

## 2014-11-11 DIAGNOSIS — Z1212 Encounter for screening for malignant neoplasm of rectum: Secondary | ICD-10-CM

## 2014-11-11 DIAGNOSIS — R7309 Other abnormal glucose: Secondary | ICD-10-CM

## 2014-11-11 DIAGNOSIS — Z6830 Body mass index (BMI) 30.0-30.9, adult: Secondary | ICD-10-CM

## 2014-11-11 DIAGNOSIS — Z79899 Other long term (current) drug therapy: Secondary | ICD-10-CM

## 2014-11-11 LAB — VITAMIN B12: Vitamin B-12: 425 pg/mL (ref 211–911)

## 2014-11-11 LAB — CBC WITH DIFFERENTIAL/PLATELET
Basophils Absolute: 0.1 K/uL (ref 0.0–0.1)
Basophils Relative: 1 % (ref 0–1)
Eosinophils Absolute: 0.4 K/uL (ref 0.0–0.7)
Eosinophils Relative: 8 % — ABNORMAL HIGH (ref 0–5)
HCT: 42.2 % (ref 39.0–52.0)
Hemoglobin: 13.9 g/dL (ref 13.0–17.0)
Lymphocytes Relative: 23 % (ref 12–46)
Lymphs Abs: 1.2 K/uL (ref 0.7–4.0)
MCH: 29 pg (ref 26.0–34.0)
MCHC: 32.9 g/dL (ref 30.0–36.0)
MCV: 88.1 fL (ref 78.0–100.0)
MPV: 9.8 fL (ref 8.6–12.4)
Monocytes Absolute: 0.5 K/uL (ref 0.1–1.0)
Monocytes Relative: 9 % (ref 3–12)
Neutro Abs: 3.1 K/uL (ref 1.7–7.7)
Neutrophils Relative %: 59 % (ref 43–77)
Platelets: 241 K/uL (ref 150–400)
RBC: 4.79 MIL/uL (ref 4.22–5.81)
RDW: 13.4 % (ref 11.5–15.5)
WBC: 5.3 K/uL (ref 4.0–10.5)

## 2014-11-11 LAB — LIPID PANEL
CHOL/HDL RATIO: 3.5 ratio (ref <=5.0)
CHOLESTEROL: 156 mg/dL (ref 125–200)
HDL: 45 mg/dL (ref >=40)
LDL Cholesterol: 99 mg/dL (ref <130)
TRIGLYCERIDES: 62 mg/dL (ref <150)
VLDL: 12 mg/dL (ref <30)

## 2014-11-11 LAB — BASIC METABOLIC PANEL WITHOUT GFR
BUN: 15 mg/dL (ref 7–25)
CO2: 26 mmol/L (ref 20–31)
Calcium: 9.5 mg/dL (ref 8.6–10.3)
Chloride: 106 mmol/L (ref 98–110)
Creat: 0.9 mg/dL (ref 0.70–1.25)
GFR, Est African American: >89 mL/min
GFR, Est Non African American: >89 mL/min
Glucose, Bld: 97 mg/dL (ref 65–99)
Potassium: 4.5 mmol/L (ref 3.5–5.3)
Sodium: 141 mmol/L (ref 135–146)

## 2014-11-11 LAB — IRON AND TIBC
%SAT: 27 % (ref 15–60)
Iron: 83 ug/dL (ref 50–180)
TIBC: 305 ug/dL (ref 250–425)
UIBC: 222 ug/dL (ref 125–400)

## 2014-11-11 LAB — HEPATIC FUNCTION PANEL
ALT: 36 U/L (ref 9–46)
AST: 23 U/L (ref 10–35)
Albumin: 4.4 g/dL (ref 3.6–5.1)
Alkaline Phosphatase: 81 U/L (ref 40–115)
Bilirubin, Direct: 0.1 mg/dL
Indirect Bilirubin: 0.5 mg/dL (ref 0.2–1.2)
Total Bilirubin: 0.6 mg/dL (ref 0.2–1.2)
Total Protein: 6.3 g/dL (ref 6.1–8.1)

## 2014-11-11 LAB — MAGNESIUM: MAGNESIUM: 1.9 mg/dL (ref 1.5–2.5)

## 2014-11-11 LAB — TSH: TSH: 1.858 u[IU]/mL (ref 0.350–4.500)

## 2014-11-11 NOTE — Progress Notes (Signed)
Patient ID: Jason Dennis, male   DOB: 1950/07/01, 64 y.o.   MRN: [id number]   Comprehensive Examination  This very nice 64 y.o. MWM presents for complete physical.  Patient has been followed for HTN, Prediabetes, Hyperlipidemia, and Vitamin D Deficiency.   HTN predates since 2006. Patient's BP has been controlled at home.Today's BP: 126/74 mmHg. Patient denies any cardiac symptoms as chest pain, palpitations, shortness of breath, dizziness or ankle swelling.   Patient's hyperlipidemia is controlled with diet and medications. Patient denies myalgias or other medication SE's. Last lipids were at goal - Cholesterol 142; HDL 40; LDL 76; Triglycerides 131 on 07/06/2014.   Patient has prediabetes since 2011 with an A1c 5.9% and then 5.5% in 2013.  Patient denies reactive hypoglycemic symptoms, visual blurring, diabetic polys or paresthesias. Last A1c was 5.8% on 07/06/2014.   Finally, patient has history of Vitamin D Deficiency of  31 in 2008 and last vitamin D was 55 on 07/06/2014.       Medication Sig  . aspirin 81 MG  Take 81 mg by mouth every other day.   Marland Kitchen atorvastatin  80 MG  TAKE ONE TABLET BY MOUTH EVERY DAY FOR CHOLESTEROL  . VITAMIN D 1000 UNITS  Take 5,000 Units by mouth daily.   . clobetasol cream (TEMOVATE) 3.78 % Apply 1 application topically 2 (two) times daily. Apply to rash 2 x day as needed  (Do not apply on face)  . hydrOXYzine  25 MG  TAKE ONE TABLET BY MOUTH TWICE DAILY AS NEEDED  . meclizine  25 MG  TAKE ONE TABLET BY MOUTH THREE TIMES DAILY AS NEEDED FOR  DIZZINESS  OR  VERTIGO  . meloxicam  15 MG  TAKE ONE TABLET BY MOUTH ONCE DAILY AFTER A MEAL FOR ACHES AND PAIN  . FISH OIL 1000 MG  Take by mouth.  Marland Kitchen omeprazole  20 MG  Take 20 mg by mouth daily.  Marland Kitchen triamcinolone crm (KENALOG) 0.1 % Apply 1 application topically 2 (two) times daily.   Allergies  Allergen Reactions  . Cialis [Tadalafil]    Past Medical History  Diagnosis Date  . Hyperlipidemia   . GERD (gastroesophageal  reflux disease)   . Prediabetes   . Vitamin D deficiency   . Obesity    Health Maintenance  Topic Date Due  . INFLUENZA VACCINE  10/04/2014  . COLONOSCOPY  02/04/2020  . TETANUS/TDAP  11/10/2024  . ZOSTAVAX  Completed  . Hepatitis C Screening  Completed  . HIV Screening  Completed   Immunization History  Administered Date(s) Administered  . PPD Test 11/10/2013, 11/11/2014  . Pneumococcal-Unspecified 04/15/2009  . Td 11/17/2004  . Tdap 11/11/2014  . Zoster 10/28/2012   Past Surgical History  Procedure Laterality Date  . Spine surgery      [phone]   Family History  Problem Relation Age of Onset  . Hypertension Mother   . Heart disease Mother   . COPD Mother   . Heart failure Mother   . Cancer Father     colon  . Heart failure Father   . Diabetes Brother    Social History   Social History  . Marital Status: Married    Spouse Name: N/A  . Number of Children: N/A  . Years of Education: N/A   Occupational History  . Not on file.   Social History Main Topics  . Smoking status: Former Smoker -- 1.00 packs/day for 32 years    Types: Cigarettes  Quit date: 02/03/1990  . Smokeless tobacco: Never Used  . Alcohol Use: 7.0 oz/week    14 drink(s) per week     Comment: 7 beers a week plus 1 mixed drink daily.  . Drug Use: No  . Sexual Activity: Not Currently    ROS Constitutional: Denies fever, chills, weight loss/gain, headaches, insomnia,  night sweats or change in appetite. Does c/o fatigue. Eyes: Denies redness, blurred vision, diplopia, discharge, itchy or watery eyes.  ENT: Denies discharge, congestion, post nasal drip, epistaxis, sore throat, earache, hearing loss, dental pain, Tinnitus, Vertigo, Sinus pain or snoring.  Cardio: Denies chest pain, palpitations, irregular heartbeat, syncope, dyspnea, diaphoresis, orthopnea, PND, claudication or edema Respiratory: denies cough, dyspnea, DOE, pleurisy, hoarseness, laryngitis or wheezing.  Gastrointestinal:  Denies dysphagia, heartburn, reflux, water brash, pain, cramps, nausea, vomiting, bloating, diarrhea, constipation, hematemesis, melena, hematochezia, jaundice or hemorrhoids Genitourinary: Denies dysuria, frequency, urgency, nocturia, hesitancy, discharge, hematuria or flank pain Musculoskeletal: Denies arthralgia, myalgia, stiffness, Jt. Swelling, pain, limp or strain/sprain. Denies Falls. Skin: Denies puritis, rash, hives, warts, acne, eczema or change in skin lesion Neuro: No weakness, tremor, incoordination, spasms, paresthesia or pain Psychiatric: Denies confusion, memory loss or sensory loss. Denies Depression. Endocrine: Denies change in weight, skin, hair change, nocturia, and paresthesia, diabetic polys, visual blurring or hyper / hypo glycemic episodes.  Heme/Lymph: No excessive bleeding, bruising or enlarged lymph nodes.  Physical Exam  BP 126/74   Pulse 60  Temp 98.1 F   Resp 16  Ht 5' 10.5"   Wt 218 lb 6.4 oz      BMI 30.88   General Appearance: Well nourished &  in no apparent distress. Eyes: PERRLA, EOMs, conjunctiva no swelling or erythema, normal fundi and vessels. Sinuses: No frontal/maxillary tenderness ENT/Mouth: EACs patent / TMs  nl. Nares clear without erythema, swelling, mucoid exudates. Oral hygiene is good. No erythema, swelling, or exudate. Tongue normal, non-obstructing. Tonsils not swollen or erythematous. Hearing normal.  Neck: Supple, thyroid normal. No bruits, nodes or JVD. Respiratory: Respiratory effort normal.  BS equal and clear bilateral without rales, rhonci, wheezing or stridor. Cardio: Heart sounds are normal with regular rate and rhythm and no murmurs, rubs or gallops. Peripheral pulses are normal and equal bilaterally without edema. No aortic or femoral bruits. Chest: symmetric with normal excursions and percussion.  Abdomen: Flat, soft, with bowel sounds. Nontender, no guarding, rebound, hernias, masses, or organomegaly.  Lymphatics: Non tender  without lymphadenopathy.  Genitourinary: No hernias.Testes nl. DRE - prostate nl for age - smooth & firm w/o nodules. Musculoskeletal: Full ROM all peripheral extremities, joint stability, 5/5 strength, and normal gait. Skin: Warm and dry without rashes, lesions, cyanosis, clubbing or  ecchymosis.  Neuro: Cranial nerves intact, reflexes equal bilaterally. Normal muscle tone, no cerebellar symptoms. Sensation intact.  Pysch: Alert and oriented X 3 with normal affect, insight and judgment appropriate.   Assessment and Plan  1. Essential hypertension  - Microalbumin / creatinine urine ratio - EKG 12-Lead - [place], RETROPERITNL ABD,  LTD - TSH  2. Hyperlipidemia  - Lipid panel  3. Prediabetes  - Hemoglobin A1c - Insulin, random  4. Vitamin D deficiency  - Vit D  25 hydroxy   5. Gastroesophageal reflux disease   6. OSA on CPAP   7. Screening for rectal cancer  - POC Hemoccult Bld/Stl   8. Prostate cancer screening  - PSA  9. Other fatigue  - Vitamin B12 - Testosterone - Iron and TIBC - TSH  10. Medication  management  - CBC with Differential/Platelet - BASIC METABOLIC PANEL WITH GFR - Hepatic function panel - Magnesium - Urinalysis, Routine w reflex microscopic   11. Screening examination for pulmonary tuberculosis  - PPD  12. Need for prophylactic vaccination with combined diphtheria-tetanus-pertussis (DTP) vaccine  - Tdap vaccine   Continue prudent diet as discussed, weight control, BP monitoring, regular exercise, and medications as discussed.  Discussed med effects and SE's. Routine screening labs and tests as requested with regular follow-up as recommended.  Over 40 minutes of exam, counseling &  chart review was performed

## 2014-11-11 NOTE — Patient Instructions (Signed)

## 2014-11-12 LAB — URINALYSIS, ROUTINE W REFLEX MICROSCOPIC
Bilirubin Urine: NEGATIVE
Glucose, UA: NEGATIVE
HGB URINE DIPSTICK: NEGATIVE
KETONES UR: NEGATIVE
LEUKOCYTES UA: NEGATIVE
NITRITE: NEGATIVE
PH: 7 (ref 5.0–8.0)
Protein, ur: NEGATIVE
SPECIFIC GRAVITY, URINE: 1.018 (ref 1.001–1.035)

## 2014-11-12 LAB — MICROALBUMIN / CREATININE URINE RATIO
Creatinine, Urine: 105.4 mg/dL
MICROALB UR: 0.2 mg/dL (ref <2.0)
Microalb Creat Ratio: 1.9 mg/g (ref 0.0–30.0)

## 2014-11-12 LAB — PSA: PSA: 1.12 ng/mL (ref <=4.00)

## 2014-11-12 LAB — HEMOGLOBIN A1C
HEMOGLOBIN A1C: 5.7 % — AB (ref <5.7)
Mean Plasma Glucose: 117 mg/dL — ABNORMAL HIGH (ref <117)

## 2014-11-12 LAB — INSULIN, RANDOM: Insulin: 12.7 u[IU]/mL (ref 2.0–19.6)

## 2014-11-12 LAB — TESTOSTERONE: Testosterone: 264 ng/dL — ABNORMAL LOW (ref 300–890)

## 2014-12-13 ENCOUNTER — Other Ambulatory Visit: Payer: Self-pay | Admitting: Internal Medicine

## 2015-01-15 ENCOUNTER — Other Ambulatory Visit: Payer: Self-pay | Admitting: Internal Medicine

## 2015-02-24 ENCOUNTER — Ambulatory Visit (INDEPENDENT_AMBULATORY_CARE_PROVIDER_SITE_OTHER): Payer: 59 | Admitting: Internal Medicine

## 2015-02-24 ENCOUNTER — Encounter: Payer: Self-pay | Admitting: Internal Medicine

## 2015-02-24 VITALS — BP 118/62 | HR 66 | Temp 98.2°F | Resp 16 | Ht 70.5 in | Wt 222.0 lb

## 2015-02-24 DIAGNOSIS — I10 Essential (primary) hypertension: Secondary | ICD-10-CM

## 2015-02-24 DIAGNOSIS — Z79899 Other long term (current) drug therapy: Secondary | ICD-10-CM | POA: Diagnosis not present

## 2015-02-24 DIAGNOSIS — E559 Vitamin D deficiency, unspecified: Secondary | ICD-10-CM

## 2015-02-24 DIAGNOSIS — R7303 Prediabetes: Secondary | ICD-10-CM

## 2015-02-24 DIAGNOSIS — E785 Hyperlipidemia, unspecified: Secondary | ICD-10-CM

## 2015-02-24 LAB — CBC WITH DIFFERENTIAL/PLATELET
BASOS ABS: 0 10*3/uL (ref 0.0–0.1)
Basophils Relative: 1 % (ref 0–1)
Eosinophils Absolute: 0.4 10*3/uL (ref 0.0–0.7)
Eosinophils Relative: 9 % — ABNORMAL HIGH (ref 0–5)
HEMATOCRIT: 41.6 % (ref 39.0–52.0)
HEMOGLOBIN: 14.1 g/dL (ref 13.0–17.0)
LYMPHS ABS: 1.2 10*3/uL (ref 0.7–4.0)
LYMPHS PCT: 27 % (ref 12–46)
MCH: 30.1 pg (ref 26.0–34.0)
MCHC: 33.9 g/dL (ref 30.0–36.0)
MCV: 88.7 fL (ref 78.0–100.0)
MPV: 10 fL (ref 8.6–12.4)
Monocytes Absolute: 0.5 10*3/uL (ref 0.1–1.0)
Monocytes Relative: 11 % (ref 3–12)
NEUTROS ABS: 2.4 10*3/uL (ref 1.7–7.7)
NEUTROS PCT: 52 % (ref 43–77)
Platelets: 242 10*3/uL (ref 150–400)
RBC: 4.69 MIL/uL (ref 4.22–5.81)
RDW: 13.2 % (ref 11.5–15.5)
WBC: 4.6 10*3/uL (ref 4.0–10.5)

## 2015-02-24 LAB — BASIC METABOLIC PANEL WITH GFR
BUN: 16 mg/dL (ref 7–25)
CHLORIDE: 107 mmol/L (ref 98–110)
CO2: 20 mmol/L (ref 20–31)
Calcium: 9.5 mg/dL (ref 8.6–10.3)
Creat: 0.87 mg/dL (ref 0.70–1.25)
GLUCOSE: 103 mg/dL — AB (ref 65–99)
POTASSIUM: 4.4 mmol/L (ref 3.5–5.3)
Sodium: 141 mmol/L (ref 135–146)

## 2015-02-24 LAB — HEPATIC FUNCTION PANEL
ALK PHOS: 91 U/L (ref 40–115)
ALT: 38 U/L (ref 9–46)
AST: 23 U/L (ref 10–35)
Albumin: 4.4 g/dL (ref 3.6–5.1)
BILIRUBIN INDIRECT: 0.6 mg/dL (ref 0.2–1.2)
Bilirubin, Direct: 0.2 mg/dL (ref <=0.2)
TOTAL PROTEIN: 6.4 g/dL (ref 6.1–8.1)
Total Bilirubin: 0.8 mg/dL (ref 0.2–1.2)

## 2015-02-24 MED ORDER — PREDNISONE 20 MG PO TABS: ORAL_TABLET | ORAL | Status: DC

## 2015-02-24 NOTE — Patient Instructions (Signed)

## 2015-02-24 NOTE — Progress Notes (Signed)
Patient ID: Jason Dennis, male   DOB: Jun 18, 1950, 64 y.o.   MRN: [id number]  Assessment and Plan:  Hypertension:  -Continue medication,  -monitor blood pressure at home.  -Continue DASH diet.   -Reminder to go to the ER if any CP, SOB, nausea, dizziness, severe HA, changes vision/speech, left arm numbness and tingling, and jaw pain.  Cholesterol: -Continue diet and exercise.  -Check cholesterol.   Pre-diabetes: -Continue diet and exercise.  -Check A1C  Vitamin D Def: -check level -continue medications.   Bilateral knee pain -exam most consistent with arthritis -2 prednisone taper -if no improvement then suggest OV with ortho for injections  Continue diet and meds as discussed. Further disposition pending results of labs.  HPI 64 y.o. male  presents for 3 month follow up with hypertension, hyperlipidemia, prediabetes and vitamin D.   His blood pressure has been controlled at home, today their BP is BP: 118/62 mmHg.   He does workout. He denies chest pain, shortness of breath, dizziness.   He is on cholesterol medication and denies myalgias. His cholesterol is at goal. The cholesterol last visit was:   Lab Results  Component Value Date   CHOL 156 11/11/2014   HDL 45 11/11/2014   LDLCALC 99 11/11/2014   TRIG 62 11/11/2014   CHOLHDL 3.5 11/11/2014     He has been working on diet and exercise for prediabetes, and denies foot ulcerations, hyperglycemia, hypoglycemia , increased appetite, nausea, paresthesia of the feet, polydipsia, polyuria, visual disturbances, vomiting and weight loss. Last A1C in the office was:  Lab Results  Component Value Date   HGBA1C 5.7* 11/11/2014    Patient is on Vitamin D supplement.  Lab Results  Component Value Date   VD25OH 55 07/06/2014     Patient reports that he has had some increasing bilateral medial knee pain.  It especially bothers him when he is trying to sleep at night and when he straightens them out it helps.    Current  Medications:  Current Outpatient Prescriptions on File Prior to Visit  Medication Sig Dispense Refill  . aspirin 81 MG tablet Take 81 mg by mouth every other day.     Marland Kitchen atorvastatin (LIPITOR) 80 MG tablet TAKE ONE TABLET BY MOUTH ONCE DAILY FOR CHOLESTEROL 90 tablet 1  . cholecalciferol (VITAMIN D) 1000 UNITS tablet Take 5,000 Units by mouth daily.     . hydrOXYzine (ATARAX/VISTARIL) 25 MG tablet TAKE ONE TABLET BY MOUTH TWICE DAILY AS NEEDED 60 tablet 99  . meclizine (ANTIVERT) 25 MG tablet TAKE ONE TABLET BY MOUTH THREE TIMES DAILY AS NEEDED FOR  DIZZINESS  OR  VERTIGO 90 tablet 1  . meloxicam (MOBIC) 15 MG tablet TAKE ONE TABLET BY MOUTH ONCE DAILY AFTER A MEAL FOR ACHES AND PAIN 90 tablet 1  . Omega-3 Fatty Acids (FISH OIL) 1000 MG CAPS Take by mouth.    Marland Kitchen omeprazole (PRILOSEC) 20 MG capsule Take 20 mg by mouth daily.     No current facility-administered medications on file prior to visit.    Medical History:  Past Medical History  Diagnosis Date  . Hyperlipidemia   . GERD (gastroesophageal reflux disease)   . Prediabetes   . Vitamin D deficiency   . Obesity     Allergies:  Allergies  Allergen Reactions  . Cialis [Tadalafil]      Review of Systems:  Review of Systems  Constitutional: Negative for fever, chills and malaise/fatigue.  HENT: Negative for congestion, ear pain and sore  throat.   Eyes: Negative.   Respiratory: Negative for cough, shortness of breath and wheezing.   Cardiovascular: Negative for chest pain, palpitations and leg swelling.  Gastrointestinal: Negative for heartburn, diarrhea, constipation, blood in stool and melena.  Genitourinary: Negative.   Skin: Negative.   Neurological: Negative for dizziness, sensory change, loss of consciousness and headaches.  Psychiatric/Behavioral: Negative for depression. The patient is not nervous/anxious and does not have insomnia.     Family history- Review and unchanged  Social history- Review and  unchanged  Physical Exam: BP 118/62 mmHg  Pulse 66  Temp(Src) 98.2 F (36.8 C) (Temporal)  Resp 16  Ht 5' 10.5" (1.791 m)  Wt 222 lb (100.699 kg)  BMI 31.39 kg/m2 Wt Readings from Last 3 Encounters:  02/24/15 222 lb (100.699 kg)  11/11/14 218 lb 6.4 oz (99.066 kg)  08/24/14 212 lb (96.163 kg)    General Appearance: Well nourished well developed, in no apparent distress. Eyes: PERRLA, EOMs, conjunctiva no swelling or erythema ENT/Mouth: Ear canals normal without obstruction, swelling, erythma, discharge.  TMs normal bilaterally.  Oropharynx moist, clear, without exudate, or postoropharyngeal swelling. Neck: Supple, thyroid normal,no cervical adenopathy  Respiratory: Respiratory effort normal, Breath sounds clear A&P without rhonchi, wheeze, or rale.  No retractions, no accessory usage. Cardio: RRR with no MRGs. Brisk peripheral pulses without edema.  Abdomen: Soft, + BS,  Non tender, no guarding, rebound, hernias, masses. Musculoskeletal: Full ROM, 5/5 strength, Normal gait. Medial knee joint line tenderness on joint line bilaterally Skin: Warm, dry without rashes, lesions, ecchymosis.  Neuro: Awake and oriented X 3, Cranial nerves intact. Normal muscle tone, no cerebellar symptoms. Psych: Normal affect, Insight and Judgment appropriate.    Starlyn Skeans, PA-C 8:55 AM [facility]

## 2015-05-30 ENCOUNTER — Ambulatory Visit: Payer: Self-pay | Admitting: Internal Medicine

## 2015-06-13 ENCOUNTER — Ambulatory Visit: Payer: Self-pay | Admitting: Internal Medicine

## 2015-06-16 ENCOUNTER — Other Ambulatory Visit: Payer: Self-pay | Admitting: Internal Medicine

## 2015-06-30 ENCOUNTER — Other Ambulatory Visit: Payer: Self-pay | Admitting: Internal Medicine

## 2015-07-11 ENCOUNTER — Ambulatory Visit: Payer: Self-pay | Admitting: Internal Medicine

## 2015-07-14 ENCOUNTER — Ambulatory Visit (INDEPENDENT_AMBULATORY_CARE_PROVIDER_SITE_OTHER): Payer: BLUE CROSS/BLUE SHIELD | Admitting: Internal Medicine

## 2015-07-14 ENCOUNTER — Encounter: Payer: Self-pay | Admitting: Internal Medicine

## 2015-07-14 VITALS — BP 132/80 | HR 64 | Temp 97.2°F | Resp 16 | Ht 70.5 in | Wt 220.8 lb

## 2015-07-14 DIAGNOSIS — E785 Hyperlipidemia, unspecified: Secondary | ICD-10-CM

## 2015-07-14 DIAGNOSIS — G4733 Obstructive sleep apnea (adult) (pediatric): Secondary | ICD-10-CM | POA: Diagnosis not present

## 2015-07-14 DIAGNOSIS — Z79899 Other long term (current) drug therapy: Secondary | ICD-10-CM | POA: Diagnosis not present

## 2015-07-14 DIAGNOSIS — K219 Gastro-esophageal reflux disease without esophagitis: Secondary | ICD-10-CM | POA: Diagnosis not present

## 2015-07-14 DIAGNOSIS — E559 Vitamin D deficiency, unspecified: Secondary | ICD-10-CM | POA: Diagnosis not present

## 2015-07-14 DIAGNOSIS — R7303 Prediabetes: Secondary | ICD-10-CM

## 2015-07-14 DIAGNOSIS — I10 Essential (primary) hypertension: Secondary | ICD-10-CM | POA: Diagnosis not present

## 2015-07-14 DIAGNOSIS — Z9989 Dependence on other enabling machines and devices: Secondary | ICD-10-CM

## 2015-07-14 LAB — LIPID PANEL
CHOL/HDL RATIO: 3.6 ratio (ref <=5.0)
Cholesterol: 158 mg/dL (ref 125–200)
HDL: 44 mg/dL (ref >=40)
LDL Cholesterol: 94 mg/dL (ref <130)
Triglycerides: 100 mg/dL (ref <150)
VLDL: 20 mg/dL (ref <30)

## 2015-07-14 LAB — CBC WITH DIFFERENTIAL/PLATELET
BASOS PCT: 1 %
Basophils Absolute: 51 cells/uL (ref 0–200)
EOS PCT: 6 %
Eosinophils Absolute: 306 cells/uL (ref 15–500)
HCT: 42.5 % (ref 38.5–50.0)
Hemoglobin: 14 g/dL (ref 13.2–17.1)
Lymphocytes Relative: 25 %
Lymphs Abs: 1275 cells/uL (ref 850–3900)
MCH: 29.4 pg (ref 27.0–33.0)
MCHC: 32.9 g/dL (ref 32.0–36.0)
MCV: 89.1 fL (ref 80.0–100.0)
MONOS PCT: 9 %
MPV: 9.9 fL (ref 7.5–12.5)
Monocytes Absolute: 459 cells/uL (ref 200–950)
NEUTROS ABS: 3009 {cells}/uL (ref 1500–7800)
Neutrophils Relative %: 59 %
PLATELETS: 230 10*3/uL (ref 140–400)
RBC: 4.77 MIL/uL (ref 4.20–5.80)
RDW: 12.9 % (ref 11.0–15.0)
WBC: 5.1 10*3/uL (ref 3.8–10.8)

## 2015-07-14 LAB — BASIC METABOLIC PANEL WITH GFR
BUN: 15 mg/dL (ref 7–25)
CALCIUM: 9.4 mg/dL (ref 8.6–10.3)
CO2: 24 mmol/L (ref 20–31)
Chloride: 106 mmol/L (ref 98–110)
Creat: 0.92 mg/dL (ref 0.70–1.25)
GFR, EST NON AFRICAN AMERICAN: 88 mL/min (ref >=60)
GFR, Est African American: >89 mL/min (ref >=60)
Glucose, Bld: 85 mg/dL (ref 65–99)
POTASSIUM: 4.3 mmol/L (ref 3.5–5.3)
SODIUM: 140 mmol/L (ref 135–146)

## 2015-07-14 LAB — HEPATIC FUNCTION PANEL
ALT: 45 U/L (ref 9–46)
AST: 25 U/L (ref 10–35)
Albumin: 4.4 g/dL (ref 3.6–5.1)
Alkaline Phosphatase: 83 U/L (ref 40–115)
BILIRUBIN DIRECT: 0.1 mg/dL (ref <=0.2)
BILIRUBIN INDIRECT: 0.5 mg/dL (ref 0.2–1.2)
Total Bilirubin: 0.6 mg/dL (ref 0.2–1.2)
Total Protein: 6.3 g/dL (ref 6.1–8.1)

## 2015-07-14 LAB — MAGNESIUM: Magnesium: 1.9 mg/dL (ref 1.5–2.5)

## 2015-07-14 NOTE — Progress Notes (Signed)
Patient ID: Jason Dennis, male   DOB: [birth year], 65 y.o.   MRN: [id number] [facility]   [name], M.D.    Uvaldo Bristle. Silverio Lay, P.A.-C      Starlyn Skeans, P.A.-C   [facility]                [address] [place] [id number] Telephone [phone] Telefax [phone] _________________________________________________________________________   This very nice 65 y.o. MWM presents for  follow up with Hypertension, Hyperlipidemia, Pre-Diabetes and Vitamin D Deficiency.    Patient has been followed expectantly for labile HTN predating since 2006 & BP has been controlled and today's BP: 132/80 mmHg. Patient has had no complaints of any cardiac type chest pain, palpitations, dyspnea/orthopnea/PND, dizziness, claudication, or dependent edema.   Hyperlipidemia is controlled with diet & meds. Patient denies myalgias or other med SE's. Last Lipids were at goal with Cholesterol 156; HDL 45; LDL 99; Triglycerides 62 on 11/11/2014.   Also, the patient has history of PreDiabetes since 2011 with A1c 5.9% and has had no symptoms of reactive hypoglycemia, diabetic polys, paresthesias or visual blurring.  Last A1c was  5.7% on 11/11/2014.   Further, the patient also has history of Vitamin D Deficiency of "31" in 2008 and supplements vitamin D without any suspected side-effects. Last vitamin D was 55 in May 2016.     Medication Sig  . aspirin 81 MG tablet Take 81 mg by mouth every other day.   Marland Kitchen atorvastatin  80 MG tablet TAKE ONE TABLET BY MOUTH ONCE DAILY FOR CHOLESTEROL  . VITAMIN D 1000 UNITS tablet Take 5,000 Units by mouth daily.   . meclizine  25 MG tablet TAKE ONE TABLET BY MOUTH THREE TIMES DAILY AS NEEDED FOR  DIZZINESS  OR  VERTIGO  . meloxicam  15 MG tablet TAKE ONE TABLET BY MOUTH ONCE DAILY AFTER A MEAL FOR  ACHES  AND  PAIN  . Omega-3 FISH OIL 1000 MG CAPS Take by mouth.  Marland Kitchen omeprazole  20 MG capsule  Take 20 mg by mouth daily.  . hydrOXYzine25 MG tablet TAKE ONE TABLET BY MOUTH TWICE DAILY AS NEEDED   Allergies  Allergen Reactions  . Cialis [Tadalafil]    PMHx:   Past Medical History  Diagnosis Date  . Hyperlipidemia   . GERD (gastroesophageal reflux disease)   . Prediabetes   . Vitamin D deficiency   . Obesity    Immunization History  Administered Date(s) Administered  . PPD Test 11/10/2013, 11/11/2014  . Pneumococcal-Unspecified 04/15/2009  . Td 11/17/2004  . Tdap 11/11/2014  . Zoster 10/28/2012   Past Surgical History  Procedure Laterality Date  . Spine surgery      [phone]   FHx:    Reviewed / unchanged  SHx:    Reviewed / unchanged  Systems Review:  Constitutional: Denies fever, chills, wt changes, headaches, insomnia, fatigue, night sweats, change in appetite. Eyes: Denies redness, blurred vision, diplopia, discharge, itchy, watery eyes.  ENT: Denies discharge, congestion, post nasal drip, epistaxis, sore throat, earache, hearing loss, dental pain, tinnitus, vertigo, sinus pain, snoring.  CV: Denies chest pain, palpitations, irregular heartbeat, syncope, dyspnea, diaphoresis, orthopnea, PND, claudication or edema. Respiratory: denies cough, dyspnea, DOE, pleurisy, hoarseness, laryngitis, wheezing.  Gastrointestinal: Denies dysphagia, odynophagia, heartburn, reflux, water brash, abdominal pain or cramps, nausea, vomiting, bloating, diarrhea, constipation, hematemesis, melena, hematochezia  or hemorrhoids. Genitourinary: Denies dysuria, frequency, urgency, nocturia, hesitancy, discharge, hematuria or flank pain. Musculoskeletal: Denies arthralgias, myalgias, stiffness, jt. swelling, pain, limping or strain/sprain.  Skin: Denies pruritus, rash, hives, warts, acne, eczema or change in skin lesion(s). Neuro: No weakness, tremor, incoordination, spasms, paresthesia or pain. Psychiatric: Denies confusion, memory loss or sensory loss. Endo: Denies change in weight,  skin or hair change.  Heme/Lymph: No excessive bleeding, bruising or enlarged lymph nodes.  Physical Exam  BP 132/80 mmHg  Pulse 64  Temp(Src) 97.2 F (36.2 C)  Resp 16  Ht 5' 10.5" (1.791 m)  Wt 220 lb 12.8 oz (100.154 kg)  BMI 31.22 kg/m2  Appears well nourished and in no distress. Eyes: PERRLA, EOMs, conjunctiva no swelling or erythema. Sinuses: No frontal/maxillary tenderness ENT/Mouth: EAC's clear, TM's nl w/o erythema, bulging. Nares clear w/o erythema, swelling, exudates. Oropharynx clear without erythema or exudates. Oral hygiene is good. Tongue normal, non obstructing. Hearing intact.  Neck: Supple. Thyroid nl. Car 2+/2+ without bruits, nodes or JVD. Chest: Respirations nl with BS clear & equal w/o rales, rhonchi, wheezing or stridor.  Cor: Heart sounds normal w/ regular rate and rhythm without sig. murmurs, gallops, clicks, or rubs. Peripheral pulses normal and equal  without edema.  Abdomen: Soft & bowel sounds normal. Non-tender w/o guarding, rebound, hernias, masses, or organomegaly.  Lymphatics: Unremarkable.  Musculoskeletal: Full ROM all peripheral extremities, joint stability, 5/5 strength, and normal gait.  Skin: Warm, dry without exposed rashes, lesions or ecchymosis apparent.  Neuro: Cranial nerves intact, reflexes equal bilaterally. Sensory-motor testing grossly intact. Tendon reflexes grossly intact.  Pysch: Alert & oriented x 3.  Insight and judgement nl & appropriate. No ideations.  Assessment and Plan:  1. Essential hypertension  - TSH  2. Hyperlipidemia  - Lipid panel - TSH  3. Prediabetes  - Hemoglobin A1c - Insulin, random  4. Vitamin D deficiency  - VITAMIN D 25 Hydroxy   5. OSA on CPAP   6. Gastroesophageal reflux disease   7. Medication management  - CBC with Differential/Platelet - BASIC METABOLIC PANEL WITH GFR - Hepatic function panel - Magnesium   Recommended regular exercise, BP monitoring, weight control, and discussed  med and SE's. Recommended labs to assess and monitor clinical status. Further disposition pending results of labs. Over 30 minutes of exam, counseling, chart review was performed

## 2015-07-14 NOTE — Patient Instructions (Signed)

## 2015-07-15 LAB — INSULIN, RANDOM: INSULIN: 7.7 u[IU]/mL (ref 2.0–19.6)

## 2015-07-15 LAB — HEMOGLOBIN A1C
HEMOGLOBIN A1C: 5.5 % (ref <5.7)
Mean Plasma Glucose: 111 mg/dL

## 2015-07-15 LAB — VITAMIN D 25 HYDROXY (VIT D DEFICIENCY, FRACTURES): VIT D 25 HYDROXY: 50 ng/mL (ref 30–100)

## 2015-07-15 LAB — TSH: TSH: 2.15 mIU/L (ref 0.40–4.50)

## 2015-08-07 ENCOUNTER — Other Ambulatory Visit: Payer: Self-pay | Admitting: Internal Medicine

## 2015-08-30 ENCOUNTER — Other Ambulatory Visit: Payer: Self-pay | Admitting: Internal Medicine

## 2015-08-31 ENCOUNTER — Other Ambulatory Visit: Payer: Self-pay | Admitting: *Deleted

## 2015-08-31 MED ORDER — PHENTERMINE HCL 37.5 MG PO TABS: ORAL_TABLET | ORAL | Status: DC

## 2015-10-04 ENCOUNTER — Other Ambulatory Visit: Payer: Self-pay | Admitting: Internal Medicine

## 2015-10-21 ENCOUNTER — Encounter: Payer: Self-pay | Admitting: Internal Medicine

## 2015-10-21 ENCOUNTER — Ambulatory Visit (INDEPENDENT_AMBULATORY_CARE_PROVIDER_SITE_OTHER): Payer: Medicare HMO | Admitting: Internal Medicine

## 2015-10-21 VITALS — BP 118/62 | HR 78 | Temp 98.0°F | Resp 16 | Ht 70.5 in | Wt 200.0 lb

## 2015-10-21 DIAGNOSIS — G4733 Obstructive sleep apnea (adult) (pediatric): Secondary | ICD-10-CM

## 2015-10-21 DIAGNOSIS — Z0001 Encounter for general adult medical examination with abnormal findings: Secondary | ICD-10-CM

## 2015-10-21 DIAGNOSIS — R6889 Other general symptoms and signs: Secondary | ICD-10-CM

## 2015-10-21 DIAGNOSIS — I10 Essential (primary) hypertension: Secondary | ICD-10-CM | POA: Diagnosis not present

## 2015-10-21 DIAGNOSIS — E669 Obesity, unspecified: Secondary | ICD-10-CM

## 2015-10-21 DIAGNOSIS — E785 Hyperlipidemia, unspecified: Secondary | ICD-10-CM

## 2015-10-21 DIAGNOSIS — K219 Gastro-esophageal reflux disease without esophagitis: Secondary | ICD-10-CM

## 2015-10-21 DIAGNOSIS — Z23 Encounter for immunization: Secondary | ICD-10-CM

## 2015-10-21 DIAGNOSIS — R7303 Prediabetes: Secondary | ICD-10-CM

## 2015-10-21 DIAGNOSIS — Z0000 Encounter for general adult medical examination without abnormal findings: Secondary | ICD-10-CM

## 2015-10-21 DIAGNOSIS — Z9989 Dependence on other enabling machines and devices: Secondary | ICD-10-CM

## 2015-10-21 DIAGNOSIS — Z79899 Other long term (current) drug therapy: Secondary | ICD-10-CM | POA: Diagnosis not present

## 2015-10-21 DIAGNOSIS — E559 Vitamin D deficiency, unspecified: Secondary | ICD-10-CM | POA: Diagnosis not present

## 2015-10-21 LAB — CBC WITH DIFFERENTIAL/PLATELET
Basophils Absolute: 51 cells/uL (ref 0–200)
Basophils Relative: 1 %
EOS ABS: 255 {cells}/uL (ref 15–500)
Eosinophils Relative: 5 %
HCT: 44 % (ref 38.5–50.0)
Hemoglobin: 14.1 g/dL (ref 13.2–17.1)
LYMPHS PCT: 21 %
Lymphs Abs: 1071 cells/uL (ref 850–3900)
MCH: 28.5 pg (ref 27.0–33.0)
MCHC: 32 g/dL (ref 32.0–36.0)
MCV: 89.1 fL (ref 80.0–100.0)
MONO ABS: 459 {cells}/uL (ref 200–950)
MPV: 10.2 fL (ref 7.5–12.5)
Monocytes Relative: 9 %
NEUTROS PCT: 64 %
Neutro Abs: 3264 cells/uL (ref 1500–7800)
PLATELETS: 231 10*3/uL (ref 140–400)
RBC: 4.94 MIL/uL (ref 4.20–5.80)
RDW: 13.3 % (ref 11.0–15.0)
WBC: 5.1 10*3/uL (ref 3.8–10.8)

## 2015-10-21 LAB — BASIC METABOLIC PANEL WITH GFR
BUN: 18 mg/dL (ref 7–25)
CALCIUM: 9.4 mg/dL (ref 8.6–10.3)
CO2: 26 mmol/L (ref 20–31)
CREATININE: 0.84 mg/dL (ref 0.70–1.25)
Chloride: 104 mmol/L (ref 98–110)
GFR, Est African American: >89 mL/min (ref >=60)
GFR, Est Non African American: >89 mL/min (ref >=60)
Glucose, Bld: 87 mg/dL (ref 65–99)
Potassium: 4.3 mmol/L (ref 3.5–5.3)
SODIUM: 139 mmol/L (ref 135–146)

## 2015-10-21 LAB — HEMOGLOBIN A1C
HEMOGLOBIN A1C: 5.1 % (ref <5.7)
Mean Plasma Glucose: 100 mg/dL

## 2015-10-21 LAB — LIPID PANEL
CHOLESTEROL: 144 mg/dL (ref 125–200)
HDL: 61 mg/dL (ref >=40)
LDL Cholesterol: 66 mg/dL (ref <130)
TRIGLYCERIDES: 85 mg/dL (ref <150)
Total CHOL/HDL Ratio: 2.4 Ratio (ref <=5.0)
VLDL: 17 mg/dL (ref <30)

## 2015-10-21 LAB — HEPATIC FUNCTION PANEL
ALBUMIN: 4.3 g/dL (ref 3.6–5.1)
ALT: 49 U/L — AB (ref 9–46)
AST: 32 U/L (ref 10–35)
Alkaline Phosphatase: 114 U/L (ref 40–115)
Bilirubin, Direct: 0.1 mg/dL (ref <=0.2)
Indirect Bilirubin: 0.6 mg/dL (ref 0.2–1.2)
TOTAL PROTEIN: 6.2 g/dL (ref 6.1–8.1)
Total Bilirubin: 0.7 mg/dL (ref 0.2–1.2)

## 2015-10-21 LAB — TSH: TSH: 1.85 m[IU]/L (ref 0.40–4.50)

## 2015-10-21 NOTE — Progress Notes (Signed)
WELCOME TO MEDICARE WELLNESS VISIT AND FOLLOW UP Assessment:    1. Essential hypertension -Cont meds -well controlled -dash diet -monitor at home - TSH  2. Hyperlipidemia -cont meds -cont diet and exercise - Lipid panel  3. Prediabetes -cont diet and exercise - Hemoglobin A1c  4. Vitamin D deficiency -cont Vit D  5. Medication management  - CBC with Differential/Platelet - BASIC METABOLIC PANEL WITH GFR - Hepatic function panel  6. Need for prophylactic vaccination and inoculation against influenza  - Flu vaccine HIGH DOSE PF (Fluzone High dose)  7. Need for prophylactic vaccination against Streptococcus pneumoniae (pneumococcus)  - Pneumococcal conjugate vaccine 13-valent  8. OSA on CPAP -cont CPAP -cont weight loss  9. Gastroesophageal reflux disease, esophagitis presence not specified -cont meds -avoid trigger foods  10. Obesity -cont phentermine -diet and exercise    Over 30 minutes of exam, counseling, chart review, and critical decision making was performed  Future Appointments Date Time Provider [place]  03/08/2016 3:00 PM [name], MD [facility] None     Plan:   During the course of the visit the patient was educated and counseled about appropriate screening and preventive services including:    Pneumococcal vaccine   Influenza vaccine  Prevnar 13  Td vaccine  Screening electrocardiogram  Colorectal cancer screening  Diabetes screening  Glaucoma screening  Nutrition counseling    Subjective:  Jason Dennis is a 65 y.o. male who presents for Medicare Annual Wellness Visit and 3 month follow up for HTN, hyperlipidemia, prediabetes, and vitamin D Def.   His blood pressure has been controlled at home, today their BP is   He does workout. He denies chest pain, shortness of breath, dizziness.  He is on cholesterol medication and denies myalgias. His cholesterol is at goal. The cholesterol last visit was:  Lab  Results  Component Value Date   CHOL 158 07/14/2015   HDL 44 07/14/2015   LDLCALC 94 07/14/2015   TRIG 100 07/14/2015   CHOLHDL 3.6 07/14/2015    He does have diet controlled prediabetes.  He reports that he is doing well with diet and exercise.   Lab Results  Component Value Date   HGBA1C 5.5 07/14/2015  He reports that he has been taking phentermine and he has been eating less and drinking less.     Last GFR Lab Results  Component Value Date   GFRNONAA 88 07/14/2015     Lab Results  Component Value Date   GFRAA >89 07/14/2015   Patient is on Vitamin D supplement.   Lab Results  Component Value Date   VD25OH 50 07/14/2015      Medication Review: Current Outpatient Prescriptions on File Prior to Visit  Medication Sig Dispense Refill  . aspirin 81 MG tablet Take 81 mg by mouth every other day.     Marland Kitchen atorvastatin (LIPITOR) 80 MG tablet TAKE ONE TABLET BY MOUTH ONCE DAILY FOR CHOLESTEROL 90 tablet 0  . cholecalciferol (VITAMIN D) 1000 UNITS tablet Take 5,000 Units by mouth daily.     . hydrOXYzine (ATARAX/VISTARIL) 25 MG tablet TAKE ONE TABLET BY MOUTH TWICE DAILY AS NEEDED 180 tablet 1  . meclizine (ANTIVERT) 25 MG tablet TAKE ONE TABLET BY MOUTH THREE TIMES DAILY AS NEEDED FOR  DIZZINESS  OR  VERTIGO 90 tablet 0  . meloxicam (MOBIC) 15 MG tablet TAKE ONE TABLET BY MOUTH ONCE DAILY AFTER A MEAL FOR  ACHES  AND  PAIN 90 tablet 0  . Omega-3 Fatty  Acids (FISH OIL) 1000 MG CAPS Take by mouth.    Marland Kitchen omeprazole (PRILOSEC) 20 MG capsule Take 20 mg by mouth daily.    . phentermine (ADIPEX-P) 37.5 MG tablet Take 1/2 to 1 tablet in the morning for dieting and weight loss. 30 tablet 5   No current facility-administered medications on file prior to visit.     Allergies: Allergies  Allergen Reactions  . Cialis [Tadalafil]     Current Problems (verified) has Hyperlipidemia; Hypertension; Prediabetes; Vitamin D deficiency; Obesity; OSA on CPAP; GERD (gastroesophageal reflux  disease); and Medication management on his problem list.  Screening Tests Immunization History  Administered Date(s) Administered  . PPD Test 11/10/2013, 11/11/2014  . Pneumococcal-Unspecified 04/15/2009  . Td 11/17/2004  . Tdap 11/11/2014  . Zoster 10/28/2012    Preventative care: Last colonoscopy: 2011  Prior vaccinations: TD or Tdap: 2016  Influenza: 2017  Pneumococcal: Due next year Prevnar13: 2017 Shingles/Zostavax: 2014  Names of Other Physician/Practitioners you currently use: 1. [facility] here for primary care 2. Dr. Delman Cheadle, eye doctor, last visit 2016 3. Dr. Aggie Moats, dentist, last visit 2017 Patient Care Team: [name], MD as PCP - General  Surgical: He  has a past surgical history that includes Spine surgery. Family His family history includes COPD in his mother; Cancer in his father; Diabetes in his brother; Heart disease in his mother; Heart failure in his father and mother; Hypertension in his mother. Social history  He reports that he quit smoking about 25 years ago. His smoking use included Cigarettes. He has a 32.00 pack-year smoking history. He has never used smokeless tobacco. He reports that he drinks about 7.0 oz of alcohol per week . He reports that he does not use drugs.  MEDICARE WELLNESS OBJECTIVES: Physical activity:   Cardiac risk factors:   Depression/mood screen:   Depression screen [facility] 2/9 07/14/2015  Decreased Interest 0  Down, Depressed, Hopeless 0  PHQ - 2 Score 0    ADLs:  In your present state of health, do you have any difficulty performing the following activities: 07/14/2015  Hearing? N  Vision? N  Difficulty concentrating or making decisions? N  Walking or climbing stairs? N  Dressing or bathing? N  Doing errands, shopping? N  Some recent data might be hidden     Cognitive Testing  Alert? Yes  Normal Appearance?Yes  Oriented to person? Yes  Place? Yes   Time? Yes  Recall of  three objects?  Yes  Can perform simple calculations? Yes  Displays appropriate judgment?Yes  Can read the correct time from a watch face?Yes  EOL planning:     Objective:   Today's Vitals   10/21/15 0848  Resp: 16  Weight: 200 lb (90.7 kg)  Height: 5' 10.5" (1.791 m)   Body mass index is 28.29 kg/m.  General appearance: alert, no distress, WD/WN, male HEENT: normocephalic, sclerae anicteric, TMs pearly, nares patent, no discharge or erythema, pharynx normal Oral cavity: MMM, no lesions Neck: supple, no lymphadenopathy, no thyromegaly, no masses Heart: RRR, normal S1, S2, no murmurs Lungs: CTA bilaterally, no wheezes, rhonchi, or rales Abdomen: +bs, soft, non tender, non distended, no masses, no hepatomegaly, no splenomegaly Musculoskeletal: nontender, no swelling, no obvious deformity Extremities: no edema, no cyanosis, no clubbing Pulses: 2+ symmetric, upper and lower extremities, normal cap refill Neurological: alert, oriented x 3, CN2-12 intact, strength normal upper extremities and lower extremities, sensation normal throughout, DTRs 2+ throughout, no cerebellar signs, gait normal  Psychiatric: normal affect, behavior normal, pleasant   Medicare Attestation I have personally reviewed: The patient's medical and social history Their use of alcohol, tobacco or illicit drugs Their current medications and supplements The patient's functional ability including ADLs,fall risks, home safety risks, cognitive, and hearing and visual impairment Diet and physical activities Evidence for depression or mood disorders  The patient's weight, height, BMI, and visual acuity have been recorded in the chart.  I have made referrals, counseling, and provided education to the patient based on review of the above and I have provided the patient with a written personalized care plan for preventive services.     Starlyn Skeans, PA-C   10/21/2015

## 2015-10-21 NOTE — Patient Instructions (Addendum)
Please try taking miralax once daily mixed in with any liquid or semiliquid to help with your bowel movements.  Take this very regularly.   Please try to add in some exercise to your current plan. Even if it is just walking.

## 2015-10-23 ENCOUNTER — Other Ambulatory Visit: Payer: Self-pay | Admitting: Internal Medicine

## 2015-11-10 ENCOUNTER — Other Ambulatory Visit: Payer: Self-pay | Admitting: Physician Assistant

## 2015-11-16 ENCOUNTER — Encounter: Payer: Self-pay | Admitting: Internal Medicine

## 2015-11-21 DIAGNOSIS — R69 Illness, unspecified: Secondary | ICD-10-CM | POA: Diagnosis not present

## 2016-01-08 ENCOUNTER — Other Ambulatory Visit: Payer: Self-pay | Admitting: Physician Assistant

## 2016-01-12 ENCOUNTER — Other Ambulatory Visit: Payer: Self-pay | Admitting: Physician Assistant

## 2016-02-02 ENCOUNTER — Ambulatory Visit: Payer: Self-pay | Admitting: Internal Medicine

## 2016-02-09 ENCOUNTER — Ambulatory Visit: Payer: Self-pay | Admitting: Internal Medicine

## 2016-02-16 ENCOUNTER — Ambulatory Visit: Payer: Self-pay | Admitting: Internal Medicine

## 2016-02-17 ENCOUNTER — Encounter: Payer: Self-pay | Admitting: Internal Medicine

## 2016-02-17 ENCOUNTER — Ambulatory Visit (INDEPENDENT_AMBULATORY_CARE_PROVIDER_SITE_OTHER): Payer: Medicare HMO | Admitting: Internal Medicine

## 2016-02-17 VITALS — BP 124/70 | HR 68 | Temp 98.0°F | Resp 16 | Ht 70.5 in | Wt 206.0 lb

## 2016-02-17 DIAGNOSIS — R7303 Prediabetes: Secondary | ICD-10-CM | POA: Diagnosis not present

## 2016-02-17 DIAGNOSIS — E782 Mixed hyperlipidemia: Secondary | ICD-10-CM

## 2016-02-17 DIAGNOSIS — E559 Vitamin D deficiency, unspecified: Secondary | ICD-10-CM

## 2016-02-17 DIAGNOSIS — Z79899 Other long term (current) drug therapy: Secondary | ICD-10-CM | POA: Diagnosis not present

## 2016-02-17 DIAGNOSIS — I10 Essential (primary) hypertension: Secondary | ICD-10-CM

## 2016-02-17 DIAGNOSIS — Z1211 Encounter for screening for malignant neoplasm of colon: Secondary | ICD-10-CM

## 2016-02-17 NOTE — Progress Notes (Signed)
Assessment and Plan:  Hypertension:  -Continue medication,  -monitor blood pressure at home.  -Continue DASH diet.   -Reminder to go to the ER if any CP, SOB, nausea, dizziness, severe HA, changes vision/speech, left arm numbness and tingling, and jaw pain.  Cholesterol: -Continue diet and exercise.  -Check cholesterol.   Pre-diabetes: -Continue diet and exercise.  -Check A1C  Vitamin D Def: -check level -continue medications.   Recommended getting blood work today and then not checking it at next visit to help with cost while on vs. Off insurance until [employer] in.   Patient will get all meds refilled prior to the end of the year.    Continue diet and meds as discussed. Further disposition pending results of labs.  HPI 65 y.o. male  presents for 3 month follow up with hypertension, hyperlipidemia, prediabetes and vitamin D.   His blood pressure has been controlled at home, today their BP is BP: 124/70.   He does not workout. He denies chest pain, shortness of breath, dizziness.   He is on cholesterol medication and denies myalgias. His cholesterol is at goal. The cholesterol last visit was:   Lab Results  Component Value Date   CHOL 144 10/21/2015   HDL 61 10/21/2015   LDLCALC 66 10/21/2015   TRIG 85 10/21/2015   CHOLHDL 2.4 10/21/2015     He has been working on diet and exercise for prediabetes, and denies foot ulcerations, hyperglycemia, hypoglycemia , increased appetite, nausea, paresthesia of the feet, polydipsia, polyuria, visual disturbances, vomiting and weight loss. Last A1C in the office was:  Lab Results  Component Value Date   HGBA1C 5.1 10/21/2015    Patient is on Vitamin D supplement.  Lab Results  Component Value Date   VD25OH 43 07/14/2015     He reports that he will be retiring at the end of the year and may be on [place] until his birthday.   He is unsure whether he wants to pay that much though.    He has plenty of medications.    Current  Medications:  Current Outpatient Prescriptions on File Prior to Visit  Medication Sig Dispense Refill  . aspirin 81 MG tablet Take 81 mg by mouth every other day.     Marland Kitchen atorvastatin (LIPITOR) 80 MG tablet TAKE ONE TABLET BY MOUTH ONCE DAILY FOR CHOLESTEROL 90 tablet 0  . cholecalciferol (VITAMIN D) 1000 UNITS tablet Take 5,000 Units by mouth daily.     . hydrOXYzine (ATARAX/VISTARIL) 25 MG tablet TAKE ONE TABLET BY MOUTH TWICE DAILY AS NEEDED 180 tablet 1  . meclizine (ANTIVERT) 25 MG tablet TAKE ONE TABLET BY MOUTH THREE TIMES DAILY AS NEEDED FOR DIZZINESS OR  VERTIGO 90 tablet 0  . meloxicam (MOBIC) 15 MG tablet TAKE ONE TABLET BY MOUTH ONCE DAILY AFTER A MEAL FOR  ACHES  AND  PAINS 90 tablet 1  . Omega-3 Fatty Acids (FISH OIL) 1000 MG CAPS Take by mouth.    Marland Kitchen omeprazole (PRILOSEC) 20 MG capsule Take 20 mg by mouth daily.    . phentermine (ADIPEX-P) 37.5 MG tablet Take 1/2 to 1 tablet in the morning for dieting and weight loss. 30 tablet 5   No current facility-administered medications on file prior to visit.     Medical History:  Past Medical History:  Diagnosis Date  . GERD (gastroesophageal reflux disease)   . Hyperlipidemia   . Obesity   . Prediabetes   . Vitamin D deficiency  Allergies:  Allergies  Allergen Reactions  . Cialis [Tadalafil]      Review of Systems:  Review of Systems  Constitutional: Negative for chills, fever and malaise/fatigue.  HENT: Negative for congestion, ear pain and sore throat.   Eyes: Negative.   Respiratory: Negative for cough, shortness of breath and wheezing.   Cardiovascular: Negative for chest pain, palpitations and leg swelling.  Gastrointestinal: Negative for abdominal pain, blood in stool, constipation, diarrhea, heartburn and melena.  Genitourinary: Negative.   Skin: Negative.   Neurological: Negative for dizziness, sensory change, loss of consciousness and headaches.  Psychiatric/Behavioral: Negative for depression. The patient  is not nervous/anxious and does not have insomnia.     Family history- Review and unchanged  Social history- Review and unchanged  Physical Exam: BP 124/70   Pulse 68   Temp 98 F (36.7 C) (Temporal)   Resp 16   Ht 5' 10.5" (1.791 m)   Wt 206 lb (93.4 kg)   BMI 29.14 kg/m  Wt Readings from Last 3 Encounters:  02/17/16 206 lb (93.4 kg)  10/21/15 200 lb (90.7 kg)  07/14/15 220 lb 12.8 oz (100.2 kg)    General Appearance: Well nourished well developed, in no apparent distress. Eyes: PERRLA, EOMs, conjunctiva no swelling or erythema ENT/Mouth: Ear canals normal without obstruction, swelling, erythma, discharge.  TMs normal bilaterally.  Oropharynx moist, clear, without exudate, or postoropharyngeal swelling. Neck: Supple, thyroid normal,no cervical adenopathy  Respiratory: Respiratory effort normal, Breath sounds clear A&P without rhonchi, wheeze, or rale.  No retractions, no accessory usage. Cardio: RRR with no MRGs. Brisk peripheral pulses without edema.  Abdomen: Soft, + BS,  Non tender, no guarding, rebound, hernias, masses. Musculoskeletal: Full ROM, 5/5 strength, Normal gait Skin: Warm, dry without rashes, lesions, ecchymosis.  Neuro: Awake and oriented X 3, Cranial nerves intact. Normal muscle tone, no cerebellar symptoms. Psych: Normal affect, Insight and Judgment appropriate.    Starlyn Skeans, PA-C 9:35 AM [facility]

## 2016-02-21 ENCOUNTER — Encounter: Payer: Self-pay | Admitting: Gastroenterology

## 2016-03-03 ENCOUNTER — Other Ambulatory Visit: Payer: Self-pay | Admitting: Internal Medicine

## 2016-03-07 ENCOUNTER — Other Ambulatory Visit: Payer: Self-pay | Admitting: Internal Medicine

## 2016-03-08 ENCOUNTER — Encounter: Payer: Self-pay | Admitting: Internal Medicine

## 2016-04-18 ENCOUNTER — Ambulatory Visit (AMBULATORY_SURGERY_CENTER): Payer: Self-pay | Admitting: *Deleted

## 2016-04-18 VITALS — Ht 70.0 in | Wt 208.0 lb

## 2016-04-18 DIAGNOSIS — Z800 Family history of malignant neoplasm of digestive organs: Secondary | ICD-10-CM

## 2016-04-18 DIAGNOSIS — Z86010 Personal history of colonic polyps: Secondary | ICD-10-CM

## 2016-04-18 MED ORDER — NA SULFATE-K SULFATE-MG SULF 17.5-3.13-1.6 GM/177ML PO SOLN: ORAL | 0 refills | Status: DC

## 2016-04-18 NOTE — Progress Notes (Signed)
Patient denies any allergies to eggs or soy. Patient denies any problems with anesthesia/sedation. Patient denies any oxygen use at home,uses CPAP. Patient aware to stop taking Phentermine 10 days prior to colonoscopy. EMMI education declined.

## 2016-04-19 ENCOUNTER — Encounter: Payer: Self-pay | Admitting: Gastroenterology

## 2016-04-30 ENCOUNTER — Other Ambulatory Visit: Payer: Self-pay | Admitting: Internal Medicine

## 2016-05-02 ENCOUNTER — Encounter: Payer: Self-pay | Admitting: Gastroenterology

## 2016-05-02 ENCOUNTER — Ambulatory Visit (AMBULATORY_SURGERY_CENTER): Payer: Medicare HMO | Admitting: Gastroenterology

## 2016-05-02 VITALS — BP 129/69 | HR 61 | Temp 98.6°F | Resp 15 | Ht 70.0 in | Wt 208.0 lb

## 2016-05-02 DIAGNOSIS — Z86010 Personal history of colonic polyps: Secondary | ICD-10-CM

## 2016-05-02 DIAGNOSIS — D125 Benign neoplasm of sigmoid colon: Secondary | ICD-10-CM

## 2016-05-02 DIAGNOSIS — K635 Polyp of colon: Secondary | ICD-10-CM

## 2016-05-02 DIAGNOSIS — E669 Obesity, unspecified: Secondary | ICD-10-CM | POA: Diagnosis not present

## 2016-05-02 DIAGNOSIS — Z800 Family history of malignant neoplasm of digestive organs: Secondary | ICD-10-CM

## 2016-05-02 DIAGNOSIS — K649 Unspecified hemorrhoids: Secondary | ICD-10-CM

## 2016-05-02 DIAGNOSIS — K219 Gastro-esophageal reflux disease without esophagitis: Secondary | ICD-10-CM | POA: Diagnosis not present

## 2016-05-02 HISTORY — PX: COLONOSCOPY: SHX174

## 2016-05-02 MED ORDER — SODIUM CHLORIDE 0.9 % IV SOLN: 500.0000 mL | INTRAVENOUS | Status: DC

## 2016-05-02 NOTE — Progress Notes (Signed)
Called to room to assist during endoscopic procedure.  Patient ID and intended procedure confirmed with present staff. Received instructions for my participation in the procedure from the performing physician.  

## 2016-05-02 NOTE — Progress Notes (Signed)
Report to PACU, RN, vss, BBS= Clear.  

## 2016-05-02 NOTE — Patient Instructions (Signed)
YOU HAD AN ENDOSCOPIC PROCEDURE TODAY AT [facility]:   Refer to the procedure report that was given to you for any specific questions about what was found during the examination.  If the procedure report does not answer your questions, please call your gastroenterologist to clarify.  If you requested that your care partner not be given the details of your procedure findings, then the procedure report has been included in a sealed envelope for you to review at your convenience later.  YOU SHOULD EXPECT: Some feelings of bloating in the abdomen. Passage of more gas than usual.  Walking can help get rid of the air that was put into your GI tract during the procedure and reduce the bloating. If you had a lower endoscopy (such as a colonoscopy or flexible sigmoidoscopy) you may notice spotting of blood in your stool or on the toilet paper. If you underwent a bowel prep for your procedure, you may not have a normal bowel movement for a few days.  Please Note:  You might notice some irritation and congestion in your nose or some drainage.  This is from the oxygen used during your procedure.  There is no need for concern and it should clear up in a day or so.  SYMPTOMS TO REPORT IMMEDIATELY:   Following lower endoscopy (colonoscopy or flexible sigmoidoscopy):  Excessive amounts of blood in the stool  Significant tenderness or worsening of abdominal pains  Swelling of the abdomen that is new, acute  Fever of 100F or higher   Following upper endoscopy (EGD)  Vomiting of blood or coffee ground material  New chest pain or pain under the shoulder blades  Painful or persistently difficult swallowing  New shortness of breath  Fever of 100F or higher  Black, tarry-looking stools  For urgent or emergent issues, a gastroenterologist can be reached at any hour by calling [phone].   DIET:  We do recommend a small meal at first, but then you may proceed to your regular diet.  Drink  plenty of fluids but you should avoid alcoholic beverages for 24 hours.  ACTIVITY:  You should plan to take it easy for the rest of today and you should NOT DRIVE or use heavy machinery until tomorrow (because of the sedation medicines used during the test).    FOLLOW UP: Our staff will call the number listed on your records the next business day following your procedure to check on you and address any questions or concerns that you may have regarding the information given to you following your procedure. If we do not reach you, we will leave a message.  However, if you are feeling well and you are not experiencing any problems, there is no need to return our call.  We will assume that you have returned to your regular daily activities without incident.  If any biopsies were taken you will be contacted by phone or by letter within the next 1-3 weeks.  Please call us at [phone] if you have not heard about the biopsies in 3 weeks.    SIGNATURES/CONFIDENTIALITY: You and/or your care partner have signed paperwork which will be entered into your electronic medical record.  These signatures attest to the fact that that the information above on your After Visit Summary has been reviewed and is understood.  Full responsibility of the confidentiality of this discharge information lies with you and/or your care-partner.  Polyp, hemorrhoid, and high fiber information given.

## 2016-05-02 NOTE — Op Note (Signed)
[facility] Patient Name: Jason Dennis Procedure Date: 05/02/2016 8:36 AM MRN: [id number] Endoscopist: Milus Banister , MD Age: 66 Referring MD:  Date of Birth: 26-Jun-1950 Gender: Male Account #: [id number] Procedure:                Colonoscopy Indications:              High risk colon cancer surveillance: Personal                            history of colonic polyps (colonoscopy 2011 3                            adenomatous polyps) also father had colon cancer Medicines:                Monitored Anesthesia Care Procedure:                Pre-Anesthesia Assessment:                           - Prior to the procedure, a History and Physical                            was performed, and patient medications and                            allergies were reviewed. The patient's tolerance of                            previous anesthesia was also reviewed. The risks                            and benefits of the procedure and the sedation                            options and risks were discussed with the patient.                            All questions were answered, and informed consent                            was obtained. Prior Anticoagulants: The patient has                            taken no previous anticoagulant or antiplatelet                            agents. ASA Grade Assessment: II - A patient with                            mild systemic disease. After reviewing the risks                            and benefits, the patient was deemed in  satisfactory condition to undergo the procedure.                           After obtaining informed consent, the colonoscope                            was passed under direct vision. Throughout the                            procedure, the patient's blood pressure, pulse, and                            oxygen saturations were monitored continuously. The                            Model CF-HQ190L  [id number]) scope was introduced                            through the anus and advanced to the the cecum,                            identified by appendiceal orifice and ileocecal                            valve. The colonoscopy was performed without                            difficulty. The patient tolerated the procedure                            well. The quality of the bowel preparation was                            excellent. The ileocecal valve, appendiceal                            orifice, and rectum were photographed. Scope In: 8:38:17 AM Scope Out: 8:50:39 AM Scope Withdrawal Time: 0 hours 9 minutes 7 seconds  Total Procedure Duration: 0 hours 12 minutes 22 seconds  Findings:                 A 2 mm polyp was found in the sigmoid colon. The                            polyp was sessile. The polyp was removed with a                            cold snare. Resection and retrieval were complete.                           Internal hemorrhoids were found. The hemorrhoids                            were small.  The exam was otherwise without abnormality on                            direct and retroflexion views. Complications:            No immediate complications. Estimated blood loss:                            None. Estimated Blood Loss:     Estimated blood loss: none. Impression:               - One 2 mm polyp in the sigmoid colon, removed with                            a cold snare. Resected and retrieved.                           - Internal hemorrhoids.                           - The examination was otherwise normal on direct                            and retroflexion views. Recommendation:           - Patient has a contact number available for                            emergencies. The signs and symptoms of potential                            delayed complications were discussed with the                            patient. Return to  normal activities tomorrow.                            Written discharge instructions were provided to the                            patient.                           - Resume previous diet.                           - Continue present medications.                           You will receive a letter within 2-3 weeks with the                            pathology results and my final recommendations.                           If the polyp(s) is proven to be 'pre-cancerous' on  pathology, you will need repeat colonoscopy in 5                            years. Milus Banister, MD 05/02/2016 8:55:33 AM This report has been signed electronically.

## 2016-05-02 NOTE — Progress Notes (Signed)
Pt's states no medical or surgical changes since previsit or office visit. 

## 2016-05-03 ENCOUNTER — Telehealth: Payer: Self-pay

## 2016-05-03 NOTE — Telephone Encounter (Signed)
  Follow up Call-  Call back number 05/02/2016  Post procedure Call Back phone  # [phone]  Permission to leave phone message Yes  Some recent data might be hidden     Patient questions:  Do you have a fever, pain , or abdominal swelling? No. Pain Score  0 *  Have you tolerated food without any problems? Yes.    Have you been able to return to your normal activities? Yes.    Do you have any questions about your discharge instructions: Diet   No. Medications  No. Follow up visit  No.  Do you have questions or concerns about your Care? No.  Actions: * If pain score is 4 or above: No action needed, pain <4.

## 2016-05-03 NOTE — Telephone Encounter (Signed)
  Follow up Call-  Call back number 05/02/2016  Post procedure Call Back phone  # [phone]  Permission to leave phone message Yes  Some recent data might be hidden     Patient questions:  Do you have a fever, pain , or abdominal swelling? No. Pain Score  0 *  Have you tolerated food without any problems? Yes.    Have you been able to return to your normal activities? Yes.    Do you have any questions about your discharge instructions: Diet   No. Medications  No. Follow up visit  No.  Do you have questions or concerns about your Care? No.  Actions: * If pain score is 4 or above: No action needed, pain <4.

## 2016-05-08 ENCOUNTER — Encounter: Payer: Self-pay | Admitting: Gastroenterology

## 2016-05-21 ENCOUNTER — Ambulatory Visit (INDEPENDENT_AMBULATORY_CARE_PROVIDER_SITE_OTHER): Payer: Medicare HMO | Admitting: Internal Medicine

## 2016-05-21 ENCOUNTER — Encounter: Payer: Self-pay | Admitting: Internal Medicine

## 2016-05-21 VITALS — BP 132/70 | HR 60 | Temp 98.0°F | Resp 16 | Ht 70.0 in | Wt 208.0 lb

## 2016-05-21 DIAGNOSIS — R55 Syncope and collapse: Secondary | ICD-10-CM

## 2016-05-21 DIAGNOSIS — J069 Acute upper respiratory infection, unspecified: Secondary | ICD-10-CM | POA: Diagnosis not present

## 2016-05-21 DIAGNOSIS — R69 Illness, unspecified: Secondary | ICD-10-CM | POA: Diagnosis not present

## 2016-05-21 MED ORDER — PROMETHAZINE-DM 6.25-15 MG/5ML PO SYRP: ORAL_SOLUTION | ORAL | 1 refills | Status: DC

## 2016-05-21 MED ORDER — PREDNISONE 20 MG PO TABS: ORAL_TABLET | ORAL | 0 refills | Status: DC

## 2016-05-21 MED ORDER — AZITHROMYCIN 250 MG PO TABS: ORAL_TABLET | ORAL | 0 refills | Status: DC

## 2016-05-21 MED ORDER — FLUTICASONE PROPIONATE 50 MCG/ACT NA SUSP: 2.0000 | Freq: Every day | NASAL | 0 refills | Status: DC

## 2016-05-21 NOTE — Patient Instructions (Signed)
Holter Monitoring A Holter monitor is a small device that is used to detect abnormal heart rhythms. It clips to your clothing and is connected by wires to flat, sticky disks (electrodes) that attach to your chest. It is worn continuously for 24-48 hours. Follow these instructions at home:  Wear your Holter monitor at all times, even while exercising and sleeping, for as long as directed by your health care provider.  Make sure that the Holter monitor is safely clipped to your clothing or close to your body as recommended by your health care provider.  Do not get the monitor or wires wet.  Do not put body lotion or moisturizer on your chest.  Keep your skin clean.  Keep a diary of your daily activities, such as walking and doing chores. If you feel that your heartbeat is abnormal or that your heart is fluttering or skipping a beat:  Record what you are doing when it happens.  Record what time of day the symptoms occur.  Return your Holter monitor as directed by your health care provider.  Keep all follow-up visits as directed by your health care provider. This is important. Get help right away if:  You feel lightheaded or you faint.  You have trouble breathing.  You feel pain in your chest, upper arm, or jaw.  You feel sick to your stomach and your skin is pale, cool, or damp.  You heartbeat feels unusual or abnormal. This information is not intended to replace advice given to you by your health care provider. Make sure you discuss any questions you have with your health care provider. Document Released: 11/18/2003 Document Revised: 07/28/2015 Document Reviewed: 09/28/2013 Elsevier Interactive Patient Education  2017 [employer].

## 2016-05-21 NOTE — Progress Notes (Signed)
HPI  Patient presents to the office for evaluation of cough.  It has been going on for 3 days.  Patient reports wet, barky, cough with white phlegm.  They also endorse change in voice, postnasal drip, sputum production and nasal congestion, and mild runniness, sore throat, mild ear pain.  .  They have tried mucinex 12 hour.  They report that nothing has worked.  They denies other sick contacts.  He reports that his wife is also is catching something too.  He also notes that he had a syncopal episode on February 20th.  He reports that he had been out playing music and had not eaten and had 4-5 beers.  He fell asleep on the couch and got up to urinate.  He blacked out while urinating.  He did lose consciousness.  He wife found him.  This happened 15 years ago.  He has not had any CP, SOB, wheezing, dizziness, or lightheadedness with exertion.    Review of Systems  Constitutional: Positive for malaise/fatigue. Negative for chills and fever.  HENT: Positive for congestion, ear pain, hearing loss and sore throat.   Respiratory: Positive for cough. Negative for sputum production, shortness of breath and wheezing.   Cardiovascular: Negative for chest pain, palpitations and leg swelling.  Neurological: Positive for loss of consciousness and headaches. Negative for sensory change, speech change, focal weakness and seizures.    PE:  Vitals:   05/21/16 1518  BP: 132/70  Pulse: 60  Resp: 16  Temp: 98 F (36.7 C)   General:  Alert and non-toxic, WDWN, NAD HEENT: NCAT, PERLA, EOM normal, no occular discharge or erythema.  Nasal mucosal edema with sinus tenderness to palpation.  Oropharynx clear with minimal oropharyngeal edema and erythema.  Mucous membranes moist and pink. Neck:  Cervical adenopathy Chest:  RRR no MRGs.  Lungs clear to auscultation A&P with no wheezes rhonchi or rales.   Abdomen: +BS x 4 quadrants, soft, non-tender, no guarding, rigidity, or rebound. Skin: warm and dry no  rash Neuro: A&Ox4, CN II-XII grossly intact  Assessment and Plan:   1. Acute URI  - predniSONE (DELTASONE) 20 MG tablet; 3 tabs po day one, then 2 tabs daily x 4 days  Dispense: 11 tablet; Refill: 0 - azithromycin (ZITHROMAX Z-PAK) 250 MG tablet; 2 po day one, then 1 daily x 4 days  Dispense: 6 tablet; Refill: 0 - fluticasone (FLONASE) 50 MCG/ACT nasal spray; Place 2 sprays into both nostrils daily.  Dispense: 16 g; Refill: 0 - promethazine-dextromethorphan (PROMETHAZINE-DM) 6.25-15 MG/5ML syrup; Take 5-10 ML PO qh8hrs prn for cough  Dispense: 360 mL; Refill: 1  2.  Syncope -sounds like it is vasovagal vs. Orthostatic in nature.   -get a holter monitor to look for rhythm changes -referral to cardiology as patient has family history of sudden cardiac death in his father and brother.

## 2016-05-24 DIAGNOSIS — Z0100 Encounter for examination of eyes and vision without abnormal findings: Secondary | ICD-10-CM | POA: Diagnosis not present

## 2016-05-24 DIAGNOSIS — Q100 Congenital ptosis: Secondary | ICD-10-CM | POA: Diagnosis not present

## 2016-05-24 DIAGNOSIS — H2513 Age-related nuclear cataract, bilateral: Secondary | ICD-10-CM | POA: Diagnosis not present

## 2016-05-30 NOTE — Patient Instructions (Signed)

## 2016-05-30 NOTE — Progress Notes (Signed)
[facility]   [name], M.D.    Uvaldo Bristle. Silverio Lay, P.A.-C      Starlyn Skeans, P.A.-C  [facility]                [address] [place] [address] Telephone [phone] Telefax [phone] Annual  Screening/Preventative Visit  & Comprehensive Evaluation & Examination     This very nice 66 y.o. MWM presents for a Screening/Preventative Visit & comprehensive evaluation and management of multiple medical co-morbidities.  Patient has been followed for labile HTN, Prediabetes, Hyperlipidemia and Vitamin D Deficiency. Patient also has OSA and is on CPAP with improved Restorative sleep and less daytime hypersomnolence.      Patient's labile HTN predates since 2006 and has been followed expectantly. Patient's BP has been controlled at home.  Today's BP is at goal -118/80. Patient denies any cardiac symptoms as chest pain, palpitations, shortness of breath, dizziness or ankle swelling.     Patient's hyperlipidemia is controlled with diet and medications. Patient denies myalgias or other medication SE's. Last lipids were at goal:  Lab Results  Component Value Date   CHOL 144 10/21/2015   HDL 61 10/21/2015   LDLCALC 66 10/21/2015   TRIG 85 10/21/2015   CHOLHDL 2.4 10/21/2015      Patient has prediabetes (A1C 5.9% in 2011) and patient denies reactive hypoglycemic symptoms, visual blurring, diabetic polys or paresthesias. Last A1c was at goal: Lab Results  Component Value Date   HGBA1C 5.1 10/21/2015       Finally, patient has history of Vitamin D Deficiency ("31" in 2008) and last vitamin D was still low & not at goal: Lab Results  Component Value Date   VD25OH 50 07/14/2015   Current Outpatient Prescriptions on File Prior to Visit  Medication Sig  . atorvastatin  80 MG  TAKE ONE TAB ONCE DAILY  . VITAMIN D Take 5,000 Unitsdaily.   Marland Kitchen FLONASE nasal spray Place 2 sprays into nostrils  daily.  . hydrOXYzine  25 MG  TAKE ONE TAB TWICE DAILY AS NEEDED  . meclizine  25 MG  TAKE ONE TAB 3 x DAILY AS NEEDED   . meloxicam  15 MG TAKE ONE TAB ONCE DAILY   . Omega-3 FISH OIL 1000 MG Take daily  . omeprazole  20 MG Take  daily.  . phentermine  37.5 MG  TAKE 1/2 -1 TAB ONCE DAILY   No Active Allergies   Past Medical History:  Diagnosis Date  . GERD (gastroesophageal reflux disease)   . Hyperlipidemia   . Obesity   . Prediabetes   . Sleep apnea    CPAP  . Vitamin D deficiency    Health Maintenance  Topic Date Due  . PNA vac Low Risk Adult (2 of 2 - PPSV23) 10/20/2016  . COLONOSCOPY  05/02/2021  . TETANUS/TDAP  11/10/2024  . INFLUENZA VACCINE  Completed  . Hepatitis C Screening  Completed  . HIV Screening  Completed   Immunization History  Administered Date(s) Administered  . Influenza, High Dose Seasonal PF 10/21/2015  . PPD Test 11/10/2013, 11/11/2014  . Pneumococcal Conjugate-13 10/21/2015  . Pneumococcal-Unspecified 04/15/2009  . Td 11/17/2004  . Tdap 11/11/2014  . Zoster 10/28/2012   Past Surgical History:  Procedure Laterality Date  . SPINE SURGERY     [phone]   Family History  Problem Relation  Age of Onset  . Hypertension Mother   . Heart disease Mother   . COPD Mother   . Heart failure Mother   . Heart failure Father   . Colon cancer Father     died at 19;pt unsure age,never knew father  . Diabetes Brother    Social History   Social History  . Marital status: Married    Spouse name: N/A  . Number of children: N/A  . Years of education: N/A   Occupational History  . Not on file.   Social History Main Topics  . Smoking status: Former Smoker    Packs/day: 1.00    Years: 32.00    Types: Cigarettes    Quit date: 02/03/1990  . Smokeless tobacco: Never Used  . Alcohol use 13.2 oz/week    14 Standard drinks or equivalent, 7 Cans of beer, 1 Shots of liquor per week     Comment: 7 beers a week plus 1 mixed drink daily.  . Drug use: No   . Sexual activity: Not Currently    ROS Constitutional: Denies fever, chills, weight loss/gain, headaches, insomnia,  night sweats or change in appetite. Does c/o fatigue. Eyes: Denies redness, blurred vision, diplopia, discharge, itchy or watery eyes.  ENT: Denies discharge, congestion, post nasal drip, epistaxis, sore throat, earache, hearing loss, dental pain, Tinnitus, Vertigo, Sinus pain or snoring.  Cardio: Denies chest pain, palpitations, irregular heartbeat, syncope, dyspnea, diaphoresis, orthopnea, PND, claudication or edema Respiratory: denies cough, dyspnea, DOE, pleurisy, hoarseness, laryngitis or wheezing.  Gastrointestinal: Denies dysphagia, heartburn, reflux, water brash, pain, cramps, nausea, vomiting, bloating, diarrhea, constipation, hematemesis, melena, hematochezia, jaundice or hemorrhoids Genitourinary: Denies dysuria, frequency, urgency, nocturia, hesitancy, discharge, hematuria or flank pain Musculoskeletal: Denies arthralgia, myalgia, stiffness, Jt. Swelling, pain, limp or strain/sprain. Denies Falls. Skin: Denies puritis, rash, hives, warts, acne, eczema or change in skin lesion Neuro: No weakness, tremor, incoordination, spasms, paresthesia or pain Psychiatric: Denies confusion, memory loss or sensory loss. Denies Depression. Endocrine: Denies change in weight, skin, hair change, nocturia, and paresthesia, diabetic polys, visual blurring or hyper / hypo glycemic episodes.  Heme/Lymph: No excessive bleeding, bruising or enlarged lymph nodes.  Physical Exam  BP 118/80   Pulse 72   Temp 97.5 F (36.4 C)   Resp 16   Ht 5' 10.5" (1.791 m)   Wt 206 lb (93.4 kg)   BMI 29.14 kg/m   General Appearance: Well nourished and well groomed and in no apparent distress.  Eyes:  Droopy upper lids.  PERRLA, EOMs, conjunctiva no swelling or erythema, normal fundi and vessels. Sinuses: No frontal/maxillary tenderness ENT/Mouth: EACs patent / TMs  nl. Nares clear without  erythema, swelling, mucoid exudates. Oral hygiene is good. No erythema, swelling, or exudate. Tongue normal, non-obstructing. Tonsils not swollen or erythematous. Hearing normal.  Neck: Supple, thyroid normal. No bruits, nodes or JVD. Respiratory: Respiratory effort normal.  BS equal and clear bilateral without rales, rhonci, wheezing or stridor. Cardio: Heart sounds are normal with regular rate and rhythm and no murmurs, rubs or gallops. Peripheral pulses are normal and equal bilaterally without edema. No aortic or femoral bruits. Chest: symmetric with normal excursions and percussion.  Abdomen: Soft, with Nl bowel sounds. Nontender, no guarding, rebound, hernias, masses, or organomegaly.  Lymphatics: Non tender without lymphadenopathy.  Genitourinary: DRE deferred at patient request due to recent Colonoscopy 1 month ago. Musculoskeletal: Full ROM all peripheral extremities, joint stability, 5/5 strength, and normal gait. Skin: Warm and dry without rashes, lesions, cyanosis,  clubbing or  ecchymosis.  Neuro: Cranial nerves intact, reflexes equal bilaterally. Normal muscle tone, no cerebellar symptoms. Sensation intact.  Pysch: Alert and oriented X 3 with normal affect, insight and judgment appropriate.   Assessment and Plan  1. Annual Preventative/Screening Exam   2. Essential hypertension  - EKG 12-Lead - [place], RETROPERITNL ABD,  LTD - Urinalysis, Routine w reflex microscopic - Microalbumin / creatinine urine ratio - CBC with Differential/Platelet - BASIC METABOLIC PANEL WITH GFR - Magnesium - TSH  3. Mixed hyperlipidemia  - EKG 12-Lead - [place], RETROPERITNL ABD,  LTD - Hepatic function panel - Lipid panel - TSH  4. Prediabetes  - EKG 12-Lead - [place], RETROPERITNL ABD,  LTD - Hemoglobin A1c - Insulin, random  5. Vitamin D deficiency  - VITAMIN D 25 Hydroxy  6. Abnormal glucose   7. Gastroesophageal reflux disease   8. OSA on CPAP   9. Screening for rectal  cancer   10. Prostate cancer screening  - PSA  11. Bladder outflow obstruction   12. Medication management   13. Screening for AAA (aortic abdominal aneurysm)  - [place], RETROPERITNL ABD,  LTD  14. Screening for ischemic heart disease  - EKG 12-Lead - Urinalysis, Routine w reflex microscopic - CBC with Differential/Platelet - BASIC METABOLIC PANEL WITH GFR - Hepatic function panel - Magnesium - Lipid panel - TSH - Hemoglobin A1c - Insulin, random - VITAMIN D 25 Hydroxy   15. Need for prophylactic vaccination against Streptococcus pneumoniae   - Pneumococcal polysaccharide vaccine 23-valent greater than or equal to 2yo subcutaneous/IM       Patient was counseled in prudent diet, weight contro to achieve/maintain BMI less than 25, BP monitoring, regular exercise and medications as discussed.  Discussed med effects and SE's. Routine screening labs and tests as requested with regular follow-up as recommended. Over 40 minutes of exam, counseling, chart review and high complex critical decision making was performed

## 2016-05-31 ENCOUNTER — Encounter: Payer: Self-pay | Admitting: Internal Medicine

## 2016-05-31 ENCOUNTER — Ambulatory Visit (INDEPENDENT_AMBULATORY_CARE_PROVIDER_SITE_OTHER): Payer: Medicare HMO | Admitting: Internal Medicine

## 2016-05-31 VITALS — BP 118/80 | HR 72 | Temp 97.5°F | Resp 16 | Ht 70.5 in | Wt 206.0 lb

## 2016-05-31 DIAGNOSIS — Z0000 Encounter for general adult medical examination without abnormal findings: Secondary | ICD-10-CM

## 2016-05-31 DIAGNOSIS — R7303 Prediabetes: Secondary | ICD-10-CM

## 2016-05-31 DIAGNOSIS — E559 Vitamin D deficiency, unspecified: Secondary | ICD-10-CM

## 2016-05-31 DIAGNOSIS — Z23 Encounter for immunization: Secondary | ICD-10-CM | POA: Diagnosis not present

## 2016-05-31 DIAGNOSIS — I10 Essential (primary) hypertension: Secondary | ICD-10-CM | POA: Diagnosis not present

## 2016-05-31 DIAGNOSIS — Z79899 Other long term (current) drug therapy: Secondary | ICD-10-CM

## 2016-05-31 DIAGNOSIS — Z1212 Encounter for screening for malignant neoplasm of rectum: Secondary | ICD-10-CM

## 2016-05-31 DIAGNOSIS — Z136 Encounter for screening for cardiovascular disorders: Secondary | ICD-10-CM | POA: Diagnosis not present

## 2016-05-31 DIAGNOSIS — Z9989 Dependence on other enabling machines and devices: Secondary | ICD-10-CM

## 2016-05-31 DIAGNOSIS — E782 Mixed hyperlipidemia: Secondary | ICD-10-CM

## 2016-05-31 DIAGNOSIS — G4733 Obstructive sleep apnea (adult) (pediatric): Secondary | ICD-10-CM

## 2016-05-31 DIAGNOSIS — Z0001 Encounter for general adult medical examination with abnormal findings: Secondary | ICD-10-CM

## 2016-05-31 DIAGNOSIS — Z125 Encounter for screening for malignant neoplasm of prostate: Secondary | ICD-10-CM

## 2016-05-31 DIAGNOSIS — R7309 Other abnormal glucose: Secondary | ICD-10-CM

## 2016-05-31 DIAGNOSIS — K219 Gastro-esophageal reflux disease without esophagitis: Secondary | ICD-10-CM

## 2016-05-31 DIAGNOSIS — N320 Bladder-neck obstruction: Secondary | ICD-10-CM

## 2016-05-31 LAB — BASIC METABOLIC PANEL WITH GFR
BUN: 15 mg/dL (ref 7–25)
CALCIUM: 9.7 mg/dL (ref 8.6–10.3)
CHLORIDE: 105 mmol/L (ref 98–110)
CO2: 22 mmol/L (ref 20–31)
Creat: 0.86 mg/dL (ref 0.70–1.25)
GFR, Est African American: >89 mL/min (ref >=60)
GLUCOSE: 95 mg/dL (ref 65–99)
Potassium: 4.5 mmol/L (ref 3.5–5.3)
Sodium: 140 mmol/L (ref 135–146)

## 2016-05-31 LAB — CBC WITH DIFFERENTIAL/PLATELET
Basophils Absolute: 61 cells/uL (ref 0–200)
Basophils Relative: 1 %
Eosinophils Absolute: 305 cells/uL (ref 15–500)
Eosinophils Relative: 5 %
HEMATOCRIT: 44.8 % (ref 38.5–50.0)
Hemoglobin: 15 g/dL (ref 13.2–17.1)
LYMPHS ABS: 1220 {cells}/uL (ref 850–3900)
Lymphocytes Relative: 20 %
MCH: 30.6 pg (ref 27.0–33.0)
MCHC: 33.5 g/dL (ref 32.0–36.0)
MCV: 91.4 fL (ref 80.0–100.0)
MONOS PCT: 9 %
MPV: 9.6 fL (ref 7.5–12.5)
Monocytes Absolute: 549 cells/uL (ref 200–950)
NEUTROS PCT: 65 %
Neutro Abs: 3965 cells/uL (ref 1500–7800)
Platelets: 231 10*3/uL (ref 140–400)
RBC: 4.9 MIL/uL (ref 4.20–5.80)
RDW: 13.4 % (ref 11.0–15.0)
WBC: 6.1 10*3/uL (ref 3.8–10.8)

## 2016-05-31 LAB — HEPATIC FUNCTION PANEL
ALBUMIN: 4.2 g/dL (ref 3.6–5.1)
ALK PHOS: 95 U/L (ref 40–115)
ALT: 36 U/L (ref 9–46)
AST: 23 U/L (ref 10–35)
BILIRUBIN INDIRECT: 0.5 mg/dL (ref 0.2–1.2)
BILIRUBIN TOTAL: 0.7 mg/dL (ref 0.2–1.2)
Bilirubin, Direct: 0.2 mg/dL (ref <=0.2)
Total Protein: 6.5 g/dL (ref 6.1–8.1)

## 2016-05-31 LAB — LIPID PANEL
Cholesterol: 165 mg/dL (ref <200)
HDL: 53 mg/dL (ref >40)
LDL CALC: 97 mg/dL (ref <100)
TRIGLYCERIDES: 76 mg/dL (ref <150)
Total CHOL/HDL Ratio: 3.1 Ratio (ref <5.0)
VLDL: 15 mg/dL (ref <30)

## 2016-05-31 LAB — TSH: TSH: 1.51 mIU/L (ref 0.40–4.50)

## 2016-05-31 LAB — PSA: PSA: 0.8 ng/mL (ref <=4.0)

## 2016-06-01 ENCOUNTER — Encounter: Payer: Self-pay | Admitting: Internal Medicine

## 2016-06-01 LAB — MAGNESIUM: Magnesium: 1.8 mg/dL (ref 1.5–2.5)

## 2016-06-01 LAB — URINALYSIS, ROUTINE W REFLEX MICROSCOPIC
BILIRUBIN URINE: NEGATIVE
GLUCOSE, UA: NEGATIVE
Hgb urine dipstick: NEGATIVE
Ketones, ur: NEGATIVE
LEUKOCYTES UA: NEGATIVE
Nitrite: NEGATIVE
PROTEIN: NEGATIVE
SPECIFIC GRAVITY, URINE: 1.013 (ref 1.001–1.035)
pH: 6.5 (ref 5.0–8.0)

## 2016-06-01 LAB — INSULIN, RANDOM: INSULIN: 14.4 u[IU]/mL (ref 2.0–19.6)

## 2016-06-01 LAB — HEMOGLOBIN A1C
Hgb A1c MFr Bld: 5.3 % (ref <5.7)
Mean Plasma Glucose: 105 mg/dL

## 2016-06-01 LAB — MICROALBUMIN / CREATININE URINE RATIO
CREATININE, URINE: 69 mg/dL (ref 20–370)
Microalb, Ur: <0.2 mg/dL

## 2016-06-01 LAB — VITAMIN D 25 HYDROXY (VIT D DEFICIENCY, FRACTURES): VIT D 25 HYDROXY: 55 ng/mL (ref 30–100)

## 2016-06-04 ENCOUNTER — Ambulatory Visit (INDEPENDENT_AMBULATORY_CARE_PROVIDER_SITE_OTHER): Payer: Medicare HMO

## 2016-06-04 DIAGNOSIS — R55 Syncope and collapse: Secondary | ICD-10-CM

## 2016-06-06 ENCOUNTER — Ambulatory Visit: Payer: Self-pay | Admitting: Internal Medicine

## 2016-06-09 ENCOUNTER — Other Ambulatory Visit: Payer: Self-pay | Admitting: Internal Medicine

## 2016-06-13 ENCOUNTER — Encounter: Payer: Self-pay | Admitting: Internal Medicine

## 2016-07-02 ENCOUNTER — Encounter: Payer: Self-pay | Admitting: Internal Medicine

## 2016-07-02 ENCOUNTER — Ambulatory Visit (INDEPENDENT_AMBULATORY_CARE_PROVIDER_SITE_OTHER): Payer: Medicare HMO | Admitting: Internal Medicine

## 2016-07-02 VITALS — BP 138/84 | HR 77 | Ht 70.5 in | Wt 207.8 lb

## 2016-07-02 DIAGNOSIS — E782 Mixed hyperlipidemia: Secondary | ICD-10-CM

## 2016-07-02 DIAGNOSIS — R55 Syncope and collapse: Secondary | ICD-10-CM | POA: Diagnosis not present

## 2016-07-02 NOTE — Progress Notes (Signed)
Cardiology Office Note   Date:  07/02/2016   ID:  Kiet Geer, DOB May 12, 1950, MRN [id number]  PCP:  Alesia Richards, MD  Cardiologist:   Dorris Carnes, MD    Pt referred by Dr Melford Aase for syncope eval     History of Present Illness: Jason Dennis is a 66 y.o. male with a history of labile HTN since before 2006, hyperlipdemia   Pt has had 2 syncopal spells  One was in remote past   Second one was March 12 /14 Had not eaten much that day  Drank several beers that dayGot back about 11 PM  Had another drink  Went in  Sat to watch TV  Fell asleep  Woke up a few hours later Went to go to bed  [place] to bathroom  No dizziness  No warm nauseated   No straining  urinating passed out   Years ago had spell 10 years ago  Woozy    Otherwise pt denies CP  Breathing is OK  Relatively active      Current Meds  Medication Sig  . atorvastatin (LIPITOR) 80 MG tablet TAKE ONE TABLET BY MOUTH ONCE DAILY FOR CHOLESTEROL  . cholecalciferol (VITAMIN D) 1000 UNITS tablet Take 5,000 Units by mouth daily.   . fluticasone (FLONASE) 50 MCG/ACT nasal spray Place 2 sprays into both nostrils daily.  . meclizine (ANTIVERT) 25 MG tablet TAKE ONE TABLET BY MOUTH THREE TIMES DAILY AS NEEDED FOR  DIZZINESS  OR  VERTIGO  . meloxicam (MOBIC) 15 MG tablet TAKE ONE TABLET BY MOUTH ONCE DAILY AFTER A MEAL FOR  ACHES  AND  PAINS  . Omega-3 Fatty Acids (FISH OIL) 1000 MG CAPS Take by mouth.  Marland Kitchen omeprazole (PRILOSEC) 20 MG capsule Take 20 mg by mouth daily.  . phentermine (ADIPEX-P) 37.5 MG tablet TAKE ONE-HALF TO ONE TABLET BY MOUTH ONCE DAILY IN THE MORNING FOR WEIGHT LOSS     Allergies:   Patient has no known allergies.   Past Medical History:  Diagnosis Date  . GERD (gastroesophageal reflux disease)   . Hyperlipidemia   . Obesity   . Prediabetes   . Sleep apnea    CPAP  . Vitamin D deficiency     Past Surgical History:  Procedure Laterality Date  . SPINE SURGERY     [phone]     Social  History:  The patient  reports that he quit smoking about 26 years ago. His smoking use included Cigarettes. He has a 32.00 pack-year smoking history. He has never used smokeless tobacco. He reports that he drinks about 13.2 oz of alcohol per week . He reports that he does not use drugs.   Family History:  The patient's family history includes COPD in his mother; Colon cancer in his father; Diabetes in his brother; Heart disease in his mother; Heart failure in his father and mother; Hypertension in his mother.   Dad died 72 of MI  Doenst know details Brother died Active  BP problems  Came in from dinner  Coughing Aug 2017  ROS:  Please see the history of present illness. All other systems are reviewed and  Negative to the above problem except as noted.    PHYSICAL EXAM: VS:  BP 138/84   Pulse 77   Ht 5' 10.5" (1.791 m)   Wt 207 lb 12.8 oz (94.3 kg)   SpO2 96%   BMI 29.39 kg/m   GEN: Well nourished, well developed, in no acute distress  HEENT: normal  Neck: no JVD, carotid bruits, or masses Cardiac: RRR; no murmurs, rubs, or gallops,no edema  Respiratory:  clear to auscultation bilaterally, normal work of breathing GI: soft, nontender, nondistended, + BS  No hepatomegaly  MS: no deformity Moving all extremities   Skin: warm and dry, no rash Neuro:  Strength and sensation are intact Psych: euthymic mood, full affect   EKG:  EKG is not  ordered today. On 05/31/16 SR 72 bpm  QT interval normal   Lipid Panel    Component Value Date/Time   CHOL 165 05/31/2016 1032   TRIG 76 05/31/2016 1032   HDL 53 05/31/2016 1032   CHOLHDL 3.1 05/31/2016 1032   VLDL 15 05/31/2016 1032   LDLCALC 97 05/31/2016 1032      Wt Readings from Last 3 Encounters:  07/02/16 207 lb 12.8 oz (94.3 kg)  05/31/16 206 lb (93.4 kg)  05/21/16 208 lb (94.3 kg)      ASSESSMENT AND PLAN:  1  Syncope  Pt has had 2 episodes in life  One years ago  Recent event was after night of drinking  Got up to urinate   Passed out.  Pt may have been dehydrated. EKG OK  Exam unremarkable  Will review with EP    2  BP  Pt BP today is fair   With history I would not pusehc  3Hyperlipidemia  COntinue statin    Current medicines are reviewed at length with the patient today.  The patient does not have concerns regarding medicines.  Signed, Dorris Carnes, MD  07/02/2016 10:40 AM    [address], [place], [place]  [address] Phone: [phone]; Fax: [phone]

## 2016-07-02 NOTE — Patient Instructions (Signed)
Your physician recommends that you continue on your current medications as directed. Please refer to the Current Medication list given to you today. Your physician recommends that you schedule a follow-up appointment as needed with Dr. Harrington Challenger.

## 2016-07-05 DIAGNOSIS — G4733 Obstructive sleep apnea (adult) (pediatric): Secondary | ICD-10-CM | POA: Diagnosis not present

## 2016-07-17 ENCOUNTER — Encounter: Payer: Self-pay | Admitting: *Deleted

## 2016-07-17 NOTE — Progress Notes (Signed)
Reviewed with EP Would recomm a cardiac MRI looking for scar Pt had episode of syncope

## 2016-08-02 ENCOUNTER — Telehealth: Payer: Self-pay | Admitting: Internal Medicine

## 2016-08-02 ENCOUNTER — Telehealth: Payer: Self-pay | Admitting: *Deleted

## 2016-08-02 DIAGNOSIS — R55 Syncope and collapse: Secondary | ICD-10-CM

## 2016-08-02 NOTE — Telephone Encounter (Deleted)
-----   Message from Fay Records, MD sent at 07/17/2016 11:03 PM EDT -----   ----- Message ----- From: Fay Records, MD Sent: 07/04/2016  11:41 PM To: Fay Records, MD  Reviewwith EP syncope

## 2016-08-02 NOTE — Telephone Encounter (Signed)
Left message on home and mobile numbers to call back to inform that Dr. Harrington Challenger has reviewed with EP and a cardiac MRI is recommended for episode of syncope.

## 2016-08-02 NOTE — Telephone Encounter (Signed)
Called the patient and left a VM to call me with the preferred time for his cardiac MRI.

## 2016-08-03 NOTE — Telephone Encounter (Signed)
Left messages on home and mobile numbers to call back to discuss having cardiac mri.

## 2016-08-05 DIAGNOSIS — G4733 Obstructive sleep apnea (adult) (pediatric): Secondary | ICD-10-CM | POA: Diagnosis not present

## 2016-08-06 ENCOUNTER — Telehealth: Payer: Self-pay | Admitting: Internal Medicine

## 2016-08-06 NOTE — Telephone Encounter (Signed)
Called patient's wife back about her question. Informed her that Dr. Harrington Challenger wants patient to have a cardiac MRI to look at the structure of the heart due to the patient's episode of syncope. Informed patient's wife that patient's heart monitor just looked at the rhythm part of the heart, and not the structure. Patient's wife verbalized understanding and had no other questions.

## 2016-08-06 NOTE — Telephone Encounter (Signed)
Patient left a VM regarding the cost of cardiac MRI.  I called the patient back and gave her the phone number to ask about cost of MRI.  The patient would like an AM appointment for his MRI.  Message sent to MRI tech at [facility].

## 2016-08-06 NOTE — Telephone Encounter (Signed)
Patient wife calling, states that she would like to know why patient needs to have cardiac MRI. Mrs. Daft states that patient wore a heart monitor and Dr. Harrington Challenger stated that "nothing was wrong." Thanks.

## 2016-08-09 ENCOUNTER — Telehealth: Payer: Self-pay | Admitting: Internal Medicine

## 2016-08-09 ENCOUNTER — Encounter: Payer: Self-pay | Admitting: Internal Medicine

## 2016-08-09 NOTE — Telephone Encounter (Signed)
Called patients wife and gave her date, time and location for cardiac MRI.  She had called the billing phone number twice to find out how much the MRI costs and left messages, but, has not heard back regarding this.

## 2016-08-15 ENCOUNTER — Other Ambulatory Visit: Payer: Self-pay | Admitting: Internal Medicine

## 2016-08-27 ENCOUNTER — Ambulatory Visit (HOSPITAL_COMMUNITY)
Admission: RE | Admit: 2016-08-27 | Discharge: 2016-08-27 | Disposition: A | Discharge status: Home / Self Care | Payer: Medicare HMO | Source: Ambulatory Visit | Attending: Internal Medicine | Admitting: Internal Medicine

## 2016-08-27 DIAGNOSIS — I081 Rheumatic disorders of both mitral and tricuspid valves: Secondary | ICD-10-CM | POA: Diagnosis not present

## 2016-08-27 DIAGNOSIS — R55 Syncope and collapse: Secondary | ICD-10-CM | POA: Diagnosis not present

## 2016-08-27 LAB — CREATININE, SERUM: CREATININE: 1.08 mg/dL (ref 0.61–1.24)

## 2016-08-27 MED ORDER — GADOBENATE DIMEGLUMINE 529 MG/ML IV SOLN
30.0000 mL | Freq: Once | INTRAVENOUS | Status: AC | PRN
  Administered 2016-08-27: 30 mL via INTRAVENOUS

## 2016-09-04 DIAGNOSIS — G4733 Obstructive sleep apnea (adult) (pediatric): Secondary | ICD-10-CM | POA: Diagnosis not present

## 2016-09-11 NOTE — Progress Notes (Signed)
Assessment and Plan:  Hypertension -Continue medication, monitor blood pressure at home. Continue DASH diet.  Reminder to go to the ER if any CP, SOB, nausea, dizziness, severe HA, changes vision/speech, left arm numbness and tingling and jaw pain.   Cholesterol -Continue diet and exercise. Check cholesterol.   Prediabetes  -Continue diet and exercise. Check A1C   Vitamin D Def - check level and continue medications.   Obesity with co morbidities-  long discussion about weight loss, diet, and exercise, will start the patient on phentermine- hand out given and AE's discussed, will do close follow up.   Syncopal episode Likely from hypotension/bradycardia post micturation Had normal MRI heart, normal holter monitor Has strong family history and several risk factors for CAD, has fatigue with walking up hill but no SOB, CP, requesting stress test, has follow up Dr. Harrington Challenger 1 month.   Continue diet and meds as discussed. Further disposition pending results of labs. Over 30 minutes of exam, counseling, chart review, and critical decision making was performed  Future Appointments Date Time Provider [place]  10/29/2016 8:20 AM Fay Records, MD [facility]  12/18/2016 9:30 AM [name], MD [facility] None  06/27/2017 10:00 AM [name], MD [facility] None     HPI 66 y.o. male  presents for 3 month follow up on hypertension, cholesterol, prediabetes, and vitamin D deficiency.   His blood pressure has been controlled at home, today their BP is BP: 124/64  He does not workout. He denies chest pain, shortness of breath, dizziness. Has some fatigue with going up a hill, one episode of post urinating syncope, no CP, SOB, but brother died suddenly at 40 with MI and dad died suddenly at 38 with MI, has 32 pack year smoking history (Q 1991), chol, and OSA.   He is on cholesterol medication and denies myalgias. His cholesterol is at goal. The cholesterol last visit  was:   Lab Results  Component Value Date   CHOL 165 05/31/2016   HDL 53 05/31/2016   LDLCALC 97 05/31/2016   TRIG 76 05/31/2016   CHOLHDL 3.1 05/31/2016    He has been working on diet and exercise for prediabetes, and denies paresthesia of the feet, polydipsia, polyuria and visual disturbances. Last A1C in the office was:  Lab Results  Component Value Date   HGBA1C 5.3 05/31/2016   Patient is on Vitamin D supplement.   Lab Results  Component Value Date   VD25OH 55 05/31/2016     BMI is Body mass index is 29.82 kg/m., he is working on diet and exercise. He does have OSA, got new CPAP.  Has restarted phentermine for weight loss.  Wt Readings from Last 3 Encounters:  09/12/16 210 lb 12.8 oz (95.6 kg)  07/02/16 207 lb 12.8 oz (94.3 kg)  05/31/16 206 lb (93.4 kg)     Current Medications:  Current Outpatient Prescriptions on File Prior to Visit  Medication Sig Dispense Refill  . atorvastatin (LIPITOR) 80 MG tablet TAKE ONE TABLET BY MOUTH ONCE DAILY FOR CHOLESTEROL 90 tablet 1  . cholecalciferol (VITAMIN D) 1000 UNITS tablet Take 5,000 Units by mouth daily.     . fluticasone (FLONASE) 50 MCG/ACT nasal spray Place 2 sprays into both nostrils daily. 16 g 0  . meclizine (ANTIVERT) 25 MG tablet TAKE 1 TABLET BY MOUTH THREE TIMES DAILY AS NEEDED FOR  DIZZINESS  OR  VERTIGO 90 tablet 0  . meloxicam (MOBIC) 15 MG tablet TAKE ONE TABLET BY MOUTH  ONCE DAILY AFTER A MEAL FOR  ACHES  AND  PAINS 90 tablet 1  . Omega-3 Fatty Acids (FISH OIL) 1000 MG CAPS Take by mouth.    Marland Kitchen omeprazole (PRILOSEC) 20 MG capsule Take 20 mg by mouth daily.    . phentermine (ADIPEX-P) 37.5 MG tablet TAKE ONE-HALF TO ONE TABLET BY MOUTH ONCE DAILY IN THE MORNING FOR WEIGHT LOSS 30 tablet 5   No current facility-administered medications on file prior to visit.    Medical History:  Past Medical History:  Diagnosis Date  . GERD (gastroesophageal reflux disease)   . Hyperlipidemia   . Obesity   . Prediabetes   .  Sleep apnea    CPAP  . Vitamin D deficiency    Allergies:  No Known Allergies  Review of Systems:  Review of Systems  Constitutional: Negative.   HENT: Negative.   Eyes: Negative.   Respiratory: Negative.   Cardiovascular: Negative.   Gastrointestinal: Negative.   Genitourinary: Negative.   Musculoskeletal: Negative.   Skin: Negative for itching and rash.  Neurological: Negative.   Endo/Heme/Allergies: Negative.   Psychiatric/Behavioral: Negative.     Family history- Review and unchanged Social history- Review and unchanged Physical Exam: BP 124/64   Pulse 82   Temp 97.7 F (36.5 C)   Resp 16   Ht 5' 10.5" (1.791 m)   Wt 210 lb 12.8 oz (95.6 kg)   BMI 29.82 kg/m  Wt Readings from Last 3 Encounters:  09/12/16 210 lb 12.8 oz (95.6 kg)  07/02/16 207 lb 12.8 oz (94.3 kg)  05/31/16 206 lb (93.4 kg)   General Appearance: Well nourished, in no apparent distress. Eyes: PERRLA, EOMs, conjunctiva no swelling or erythema Sinuses: No Frontal/maxillary tenderness ENT/Mouth: Ext aud canals clear, TMs without erythema, bulging. No erythema, swelling, or exudate on post pharynx.  Tonsils not swollen or erythematous. Hearing normal.  Neck: Supple, thyroid normal.  Respiratory: Respiratory effort normal, BS equal bilaterally without rales, rhonchi, wheezing or stridor.  Cardio: RRR with no MRGs. Brisk peripheral pulses without edema.  Abdomen: Soft, + BS,  Non tender, no guarding, rebound, hernias, masses. Lymphatics: Non tender without lymphadenopathy.  Musculoskeletal: Full ROM, 5/5 strength, Normal gait Skin: Warm, dry without lesions, ecchymosis.  Neuro: Cranial nerves intact. Normal muscle tone, no cerebellar symptoms. Psych: Awake and oriented X 3, normal affect, Insight and Judgment appropriate.    Vicie Mutters, PA-C 11:29 AM [facility]

## 2016-09-12 ENCOUNTER — Ambulatory Visit: Payer: Self-pay | Admitting: Internal Medicine

## 2016-09-12 ENCOUNTER — Encounter: Payer: Self-pay | Admitting: Physician Assistant

## 2016-09-12 ENCOUNTER — Ambulatory Visit (INDEPENDENT_AMBULATORY_CARE_PROVIDER_SITE_OTHER): Payer: Medicare HMO | Admitting: Physician Assistant

## 2016-09-12 VITALS — BP 124/64 | HR 82 | Temp 97.7°F | Resp 16 | Ht 70.5 in | Wt 210.8 lb

## 2016-09-12 DIAGNOSIS — I10 Essential (primary) hypertension: Secondary | ICD-10-CM | POA: Diagnosis not present

## 2016-09-12 DIAGNOSIS — E782 Mixed hyperlipidemia: Secondary | ICD-10-CM | POA: Diagnosis not present

## 2016-09-12 DIAGNOSIS — R7303 Prediabetes: Secondary | ICD-10-CM

## 2016-09-12 DIAGNOSIS — Z79899 Other long term (current) drug therapy: Secondary | ICD-10-CM | POA: Diagnosis not present

## 2016-09-12 LAB — HEPATIC FUNCTION PANEL
ALBUMIN: 4.4 g/dL (ref 3.6–5.1)
ALT: 41 U/L (ref 9–46)
AST: 32 U/L (ref 10–35)
Alkaline Phosphatase: 94 U/L (ref 40–115)
BILIRUBIN DIRECT: 0.1 mg/dL (ref <=0.2)
BILIRUBIN INDIRECT: 0.5 mg/dL (ref 0.2–1.2)
Total Bilirubin: 0.6 mg/dL (ref 0.2–1.2)
Total Protein: 6.4 g/dL (ref 6.1–8.1)

## 2016-09-12 LAB — CBC WITH DIFFERENTIAL/PLATELET
BASOS PCT: 1 %
Basophils Absolute: 41 cells/uL (ref 0–200)
Eosinophils Absolute: 205 cells/uL (ref 15–500)
Eosinophils Relative: 5 %
HCT: 42.6 % (ref 38.5–50.0)
HEMOGLOBIN: 14.1 g/dL (ref 13.2–17.1)
LYMPHS ABS: 820 {cells}/uL — AB (ref 850–3900)
Lymphocytes Relative: 20 %
MCH: 31.1 pg (ref 27.0–33.0)
MCHC: 33.1 g/dL (ref 32.0–36.0)
MCV: 93.8 fL (ref 80.0–100.0)
MPV: 9.8 fL (ref 7.5–12.5)
Monocytes Absolute: 369 cells/uL (ref 200–950)
Monocytes Relative: 9 %
NEUTROS PCT: 65 %
Neutro Abs: 2665 cells/uL (ref 1500–7800)
Platelets: 234 10*3/uL (ref 140–400)
RBC: 4.54 MIL/uL (ref 4.20–5.80)
RDW: 12.7 % (ref 11.0–15.0)
WBC: 4.1 10*3/uL (ref 3.8–10.8)

## 2016-09-12 LAB — BASIC METABOLIC PANEL WITH GFR
BUN: 23 mg/dL (ref 7–25)
CALCIUM: 9.3 mg/dL (ref 8.6–10.3)
CO2: 22 mmol/L (ref 20–31)
Chloride: 104 mmol/L (ref 98–110)
Creat: 0.91 mg/dL (ref 0.70–1.25)
GFR, EST NON AFRICAN AMERICAN: 88 mL/min (ref >=60)
Glucose, Bld: 115 mg/dL — ABNORMAL HIGH (ref 65–99)
Potassium: 4 mmol/L (ref 3.5–5.3)
Sodium: 140 mmol/L (ref 135–146)

## 2016-09-12 LAB — LIPID PANEL
Cholesterol: 152 mg/dL (ref <200)
HDL: 52 mg/dL (ref >40)
LDL CALC: 70 mg/dL (ref <100)
Total CHOL/HDL Ratio: 2.9 Ratio (ref <5.0)
Triglycerides: 150 mg/dL — ABNORMAL HIGH (ref <150)
VLDL: 30 mg/dL (ref <30)

## 2016-09-12 LAB — TSH: TSH: 1.62 m[IU]/L (ref 0.40–4.50)

## 2016-09-12 NOTE — Patient Instructions (Signed)
Here is some information to help you keep your heart healthy: Move it! - Aim for 30 mins of activity every day. Take it slowly at first. Talk to [place] before starting any new exercise program.   Lose it.  -Body Mass Index (BMI) can indicate if you need to lose weight. A healthy range is 18.5-24.9. For a BMI calculator, go to [employer]  Waist Management -Excess abdominal fat is a risk factor for heart disease, diabetes, asthma, stroke and more. Ideal waist circumference is less than 35" for women and less than 40" for men.   Eat Right -focus on fruits, vegetables, whole grains, and meals you make yourself. Avoid foods with trans fat and high sugar/sodium content.   Snooze or Snore? - Loud snoring can be a sign of sleep apnea, a significant risk factor for high blood pressure, heart attach, stroke, and heart arrhythmias.  Kick the habit -Quit Smoking! Avoid second hand smoke. A single cigarette raises your blood pressure for 20 mins and increases the risk of heart attack and stroke for the next 24 hours.   Are Aspirin and Supplements right for you? -Add ENTERIC COATED low dose 81 mg Aspirin daily OR can do every other day if you have easy bruising to protect your heart and head. As well as to reduce risk of Colon Cancer by 20 %, Skin Cancer by 26 % , Melanoma by 46% and Pancreatic cancer by 60%  Say "No to Stress -There may be little you can do about problems that cause stress. However, techniques such as long walks, meditation, and exercise can help you manage it.   Start Now! - Make changes one at a time and set reasonable goals to increase your likelihood of success.     Fatigue Fatigue is feeling tired all of the time, a lack of energy, or a lack of motivation. Occasional or mild fatigue is often a normal response to activity or life in general. However, long-lasting (chronic) or extreme fatigue may indicate an underlying medical condition. Follow these instructions at home: Watch  your fatigue for any changes. The following actions may help to lessen any discomfort you are feeling:  Talk to your health care provider about how much sleep you need each night. Try to get the required amount every night.  Take medicines only as directed by your health care provider.  Eat a healthy and nutritious diet. Ask your health care provider if you need help changing your diet.  Drink enough fluid to keep your urine clear or pale yellow.  Practice ways of relaxing, such as yoga, meditation, massage therapy, or acupuncture.  Exercise regularly.  Change situations that cause you stress. Try to keep your work and personal routine reasonable.  Do not abuse illegal drugs.  Limit alcohol intake to no more than 1 drink per day for nonpregnant women and 2 drinks per day for men. One drink equals 12 ounces of beer, 5 ounces of wine, or 1 ounces of hard liquor.  Take a multivitamin, if directed by your health care provider.  Contact a health care provider if:  Your fatigue does not get better.  You have a fever.  You have unintentional weight loss or gain.  You have headaches.  You have difficulty: ? Falling asleep. ? Sleeping throughout the night.  You feel angry, guilty, anxious, or sad.  You are unable to have a bowel movement (constipation).  You skin is dry.  Your legs or another part of your body is  swollen. Get help right away if:  You feel confused.  Your vision is blurry.  You feel faint or pass out.  You have a severe headache.  You have severe abdominal, pelvic, or back pain.  You have chest pain, shortness of breath, or an irregular or fast heartbeat.  You are unable to urinate or you urinate less than normal.  You develop abnormal bleeding, such as bleeding from the rectum, vagina, nose, lungs, or nipples.  You vomit blood.  You have thoughts about harming yourself or committing suicide.  You are worried that you might harm someone  else. This information is not intended to replace advice given to you by your health care provider. Make sure you discuss any questions you have with your health care provider. Document Released: 12/17/2006 Document Revised: 07/28/2015 Document Reviewed: 06/23/2013 Elsevier Interactive Patient Education  [employer].

## 2016-09-13 LAB — MAGNESIUM: MAGNESIUM: 2 mg/dL (ref 1.5–2.5)

## 2016-10-05 DIAGNOSIS — G4733 Obstructive sleep apnea (adult) (pediatric): Secondary | ICD-10-CM | POA: Diagnosis not present

## 2016-10-07 ENCOUNTER — Other Ambulatory Visit: Payer: Self-pay | Admitting: Internal Medicine

## 2016-10-08 ENCOUNTER — Other Ambulatory Visit: Payer: Self-pay | Admitting: Internal Medicine

## 2016-10-08 NOTE — Telephone Encounter (Signed)
Please call Phentermine 

## 2016-10-23 DIAGNOSIS — Q100 Congenital ptosis: Secondary | ICD-10-CM | POA: Diagnosis not present

## 2016-10-28 NOTE — Progress Notes (Signed)
Cardiology Office Note   Date:  10/29/2016   ID:  Rafi, Kenneth 1950-04-27, MRN [id number]  PCP:  [name], MD  Cardiologist:   Dorris Carnes, MD    F/U of HTN and syncope    History of Present Illness: Jason Dennis is a 66 y.o. male with a history of labile HTN, HL and syncope   I saw him in April 2018  Since seen he denies dizziness  No syncope   No CP  No SOB  Does not check BP at home  Current Meds  Medication Sig  . atorvastatin (LIPITOR) 80 MG tablet TAKE ONE TABLET BY MOUTH ONCE DAILY FOR  CHOLESTEROL  . cholecalciferol (VITAMIN D) 1000 UNITS tablet Take 5,000 Units by mouth daily.   . fluticasone (FLONASE) 50 MCG/ACT nasal spray Place 2 sprays into both nostrils daily.  . meclizine (ANTIVERT) 25 MG tablet TAKE 1 TABLET BY MOUTH THREE TIMES DAILY AS NEEDED FOR  DIZZINESS  OR  VERTIGO  . meloxicam (MOBIC) 15 MG tablet TAKE ONE TABLET BY MOUTH ONCE DAILY AFTER A MEAL FOR  ACHES  AND  PAINS  . Omega-3 Fatty Acids (FISH OIL) 1000 MG CAPS Take by mouth.  Marland Kitchen omeprazole (PRILOSEC) 20 MG capsule Take 20 mg by mouth daily.  . phentermine (ADIPEX-P) 37.5 MG tablet TAKE ONE-HALF TO ONE TABLET BY MOUTH ONCE DAILY IN THE MORNING FOR WEIGHT LOSS     Allergies:   Patient has no known allergies.   Past Medical History:  Diagnosis Date  . GERD (gastroesophageal reflux disease)   . Hyperlipidemia   . Obesity   . Prediabetes   . Sleep apnea    CPAP  . Vitamin D deficiency     Past Surgical History:  Procedure Laterality Date  . SPINE SURGERY     [phone]     Social History:  The patient  reports that he quit smoking about 26 years ago. His smoking use included Cigarettes. He has a 32.00 pack-year smoking history. He has never used smokeless tobacco. He reports that he drinks about 13.2 oz of alcohol per week . He reports that he does not use drugs.   Family History:  The patient's family history includes COPD in his mother; Colon cancer in his father;  Diabetes in his brother; Heart disease in his mother; Heart failure in his father and mother; Hypertension in his mother.    ROS:  Please see the history of present illness. All other systems are reviewed and  Negative to the above problem except as noted.    PHYSICAL EXAM: VS:  BP 138/78   Pulse (!) 108   Ht 5\' 10"  (1.778 m)   Wt 211 lb 8 oz (95.9 kg)   SpO2 96%   BMI 30.35 kg/m   GEN: Well nourished, well developed, in no acute distress  HEENT: normal  Neck: no JVD, carotid bruits, or masses Cardiac: RRR; no murmurs, rubs, or gallops,no edema  Respiratory:  clear to auscultation bilaterally, normal work of breathing GI: soft, nontender, nondistended, + BS  No hepatomegaly  MS: no deformity Moving all extremities   Skin: warm and dry, no rash Neuro:  Strength and sensation are intact Psych: euthymic mood, full affect   EKG:  EKG is not  ordered today.   Lipid Panel    Component Value Date/Time   CHOL 152 09/12/2016 1147   TRIG 150 (H) 09/12/2016 1147   HDL 52 09/12/2016 1147   CHOLHDL 2.9 09/12/2016  1147   VLDL 30 09/12/2016 1147   LDLCALC 70 09/12/2016 1147      Wt Readings from Last 3 Encounters:  10/29/16 211 lb 8 oz (95.9 kg)  09/12/16 210 lb 12.8 oz (95.6 kg)  07/02/16 207 lb 12.8 oz (94.3 kg)      ASSESSMENT AND PLAN:  1  Syncope  No further spells  MRI negatvie for RV dysplasia  Follow  Stay hydrated  2  Blood pressure  I have asked him to take BP at home  I dont think this was a true resting reading  If above 135 call    3  HL  Continue statin Pt wants to know if he has CAD  WIll review with imaging if MRI detects plaquing  Otherwise sched CT score (brother died suddenly)   Current medicines are reviewed at length with the patient today.  The patient does not have concerns regarding medicines.  Signed, Dorris Carnes, MD  10/29/2016 8:50 AM    [address], [place], [place]  [address] Phone: [phone]; Fax:  [phone]

## 2016-10-29 ENCOUNTER — Ambulatory Visit (INDEPENDENT_AMBULATORY_CARE_PROVIDER_SITE_OTHER): Payer: Medicare HMO | Admitting: Internal Medicine

## 2016-10-29 ENCOUNTER — Encounter: Payer: Self-pay | Admitting: Internal Medicine

## 2016-10-29 VITALS — BP 138/78 | HR 108 | Ht 70.0 in | Wt 211.5 lb

## 2016-10-29 DIAGNOSIS — E782 Mixed hyperlipidemia: Secondary | ICD-10-CM

## 2016-10-29 DIAGNOSIS — R55 Syncope and collapse: Secondary | ICD-10-CM

## 2016-10-29 DIAGNOSIS — I10 Essential (primary) hypertension: Secondary | ICD-10-CM

## 2016-10-29 NOTE — Patient Instructions (Signed)
Your physician recommends that you continue on your current medications as directed. Please refer to the Current Medication list given to you today. Your physician recommends that you schedule a follow-up appointment AS NEEDED WITH DR. Harrington Challenger.  Check your blood pressure at home.  Please call or send a MyChart message to Dr. Harrington Challenger if top number is running greater than 135.

## 2016-11-05 DIAGNOSIS — G4733 Obstructive sleep apnea (adult) (pediatric): Secondary | ICD-10-CM | POA: Diagnosis not present

## 2016-11-13 DIAGNOSIS — R69 Illness, unspecified: Secondary | ICD-10-CM | POA: Diagnosis not present

## 2016-11-29 DIAGNOSIS — R69 Illness, unspecified: Secondary | ICD-10-CM | POA: Diagnosis not present

## 2016-12-01 ENCOUNTER — Other Ambulatory Visit: Payer: Self-pay | Admitting: Internal Medicine

## 2016-12-05 DIAGNOSIS — G4733 Obstructive sleep apnea (adult) (pediatric): Secondary | ICD-10-CM | POA: Diagnosis not present

## 2016-12-17 ENCOUNTER — Other Ambulatory Visit: Payer: Self-pay | Admitting: Internal Medicine

## 2016-12-17 DIAGNOSIS — E669 Obesity, unspecified: Secondary | ICD-10-CM | POA: Diagnosis not present

## 2016-12-17 DIAGNOSIS — Z6830 Body mass index (BMI) 30.0-30.9, adult: Secondary | ICD-10-CM | POA: Diagnosis not present

## 2016-12-17 DIAGNOSIS — D23111 Other benign neoplasm of skin of right upper eyelid, including canthus: Secondary | ICD-10-CM | POA: Diagnosis not present

## 2016-12-17 DIAGNOSIS — K219 Gastro-esophageal reflux disease without esophagitis: Secondary | ICD-10-CM | POA: Diagnosis not present

## 2016-12-17 DIAGNOSIS — I517 Cardiomegaly: Secondary | ICD-10-CM | POA: Diagnosis not present

## 2016-12-17 DIAGNOSIS — G4730 Sleep apnea, unspecified: Secondary | ICD-10-CM | POA: Diagnosis not present

## 2016-12-17 DIAGNOSIS — Z87891 Personal history of nicotine dependence: Secondary | ICD-10-CM | POA: Diagnosis not present

## 2016-12-17 DIAGNOSIS — Q100 Congenital ptosis: Secondary | ICD-10-CM | POA: Diagnosis not present

## 2016-12-18 ENCOUNTER — Ambulatory Visit: Payer: Self-pay | Admitting: Internal Medicine

## 2016-12-22 NOTE — Progress Notes (Signed)
WELCOME TO MEDICARE WELLNESS VISIT AND FOLLOW UP Assessment:   Essential hypertension - continue medications, DASH diet, exercise and monitor at home. Call if greater than 130/80.  -     CBC with Differential/Platelet -     BASIC METABOLIC PANEL WITH GFR -     Hepatic function panel -     TSH  OSA on CPAP Weight loss advised, continue CPAP  Mixed hyperlipidemia -continue medications, check lipids, decrease fatty foods, increase activity.  -     Lipid panel  Prediabetes Discussed general issues about diabetes pathophysiology and management., Educational material distributed., Suggested low cholesterol diet., Encouraged aerobic exercise., Discussed foot care., Reminded to get yearly retinal exam.  Medication management -     Magnesium  Gastroesophageal reflux disease, esophagitis presence not specified Continue PPI/H2 blocker, diet discussed  Vitamin D deficiency Continue supplement  Encounter for Medicare annual wellness exam 1 year  Needs flu shot -     Flu vaccine HIGH DOSE PF  BMI 30.0-30.9,adult - long discussion about weight loss, diet, and exercise   Over 30 minutes of exam, counseling, chart review, and critical decision making was performed  Future Appointments Date Time Provider [place]  06/27/2017 10:00 AM [name], MD [facility] None     Plan:   During the course of the visit the patient was educated and counseled about appropriate screening and preventive services including:    Pneumococcal vaccine   Influenza vaccine  Prevnar 13  Td vaccine  Screening electrocardiogram  Colorectal cancer screening  Diabetes screening  Glaucoma screening  Nutrition counseling    Subjective:  Jason Dennis is a 66 y.o. male who presents for Medicare Annual Wellness Visit and 3 month follow up for HTN, hyperlipidemia, prediabetes, and vitamin D Def.   His blood pressure has been controlled at home, today their BP is BP: 132/80, states  at home has been a little elevated but changed diet and stopped herb.  He does workout. He denies chest pain, shortness of breath, dizziness.  He is on cholesterol medication and denies myalgias. His cholesterol is at goal. The cholesterol last visit was:   Lab Results  Component Value Date   CHOL 152 09/12/2016   HDL 52 09/12/2016   LDLCALC 70 09/12/2016   TRIG 150 (H) 09/12/2016   CHOLHDL 2.9 09/12/2016   He does have diet controlled prediabetes.  He reports that he is doing well with diet and exercise.   Lab Results  Component Value Date   HGBA1C 5.3 05/31/2016  He reports that he has been taking phentermine and he has been eating less and drinking less.    Last GFR Lab Results  Component Value Date   GFRNONAA 88 09/12/2016    Patient is on Vitamin D supplement.   Lab Results  Component Value Date   VD25OH 55 05/31/2016     BMI is Body mass index is 30.53 kg/m., he is working on diet and exercise. He has OSA and is on CPAP. Started back on phentermine this AM.  Wt Readings from Last 3 Encounters:  12/24/16 212 lb 12.8 oz (96.5 kg)  10/29/16 211 lb 8 oz (95.9 kg)  09/12/16 210 lb 12.8 oz (95.6 kg)    Medication Review: Current Outpatient Prescriptions on File Prior to Visit  Medication Sig Dispense Refill  . atorvastatin (LIPITOR) 80 MG tablet TAKE ONE TABLET BY MOUTH ONCE DAILY FOR  CHOLESTEROL 90 tablet 1  . cholecalciferol (VITAMIN D) 1000 UNITS tablet Take 5,000  Units by mouth daily.     . meclizine (ANTIVERT) 25 MG tablet TAKE 1 TABLET BY MOUTH THREE TIMES DAILY AS NEEDED FOR  DIZZINESS  OR  VERTIGO 90 tablet 0  . meloxicam (MOBIC) 15 MG tablet TAKE ONE TABLET BY MOUTH ONCE DAILY AFTER A MEAL FOR  ACHES  AND  PAINS 90 tablet 1  . Omega-3 Fatty Acids (FISH OIL) 1000 MG CAPS Take by mouth.    Marland Kitchen omeprazole (PRILOSEC) 20 MG capsule Take 20 mg by mouth daily.    . phentermine (ADIPEX-P) 37.5 MG tablet TAKE ONE-HALF TO ONE TABLET BY MOUTH ONCE DAILY IN THE MORNING FOR  WEIGHT LOSS 30 tablet 2   No current facility-administered medications on file prior to visit.     Allergies: No Known Allergies  Current Problems (verified) has Hyperlipidemia; Hypertension; Prediabetes; Vitamin D deficiency; OSA on CPAP; GERD (gastroesophageal reflux disease); and Medication management on his problem list.  Screening Tests Immunization History  Administered Date(s) Administered  . Influenza, High Dose Seasonal PF 10/21/2015, 12/24/2016  . PPD Test 11/10/2013, 11/11/2014  . Pneumococcal Conjugate-13 10/21/2015  . Pneumococcal Polysaccharide-23 05/31/2016  . Pneumococcal-Unspecified 04/15/2009  . Td 11/17/2004  . Tdap 11/11/2014  . Zoster 10/28/2012    Preventative care: Last colonoscopy: 04/2016 Prior vaccinations: TD or Tdap: 2016  Influenza: TODAY  Pneumococcal: 2018 Prevnar13: 2017 Shingles/Zostavax: 2014  Names of Other Physician/Practitioners you currently use: 1. [facility] here for primary care 2. Dr. Delman Cheadle, eye doctor, last visit 2018 3. Dr. Aggie Moats, dentist, last visit 11/2016 Patient Care Team: [name], MD as PCP - General  Surgical: He  has a past surgical history that includes Spine surgery. Family His family history includes COPD in his mother; Colon cancer in his father; Diabetes in his brother; Heart disease in his mother; Heart failure in his father and mother; Hypertension in his mother. Social history  He reports that he quit smoking about 26 years ago. His smoking use included Cigarettes. He has a 32.00 pack-year smoking history. He has never used smokeless tobacco. He reports that he drinks about 13.2 oz of alcohol per week . He reports that he does not use drugs.  MEDICARE WELLNESS OBJECTIVES: Physical activity: Current Exercise Habits: The patient does not participate in regular exercise at present Cardiac risk factors: Cardiac Risk Factors include: advanced age (>26men, >35  women);dyslipidemia;hypertension;male gender;obesity (BMI >30kg/m2);sedentary lifestyle Depression/mood screen:   Depression screen [facility] 2/9 12/24/2016  Decreased Interest 0  Down, Depressed, Hopeless 0  PHQ - 2 Score 0    ADLs:  In your present state of health, do you have any difficulty performing the following activities: 12/24/2016 06/01/2016  Hearing? N N  Vision? N Y  Comment - has droopy upper eyelids or ptosis obscuring upper visual fields.  Difficulty concentrating or making decisions? N N  Walking or climbing stairs? N N  Dressing or bathing? N N  Doing errands, shopping? N N  Some recent data might be hidden    Cognitive Testing  Alert? Yes  Normal Appearance?Yes  Oriented to person? Yes  Place? Yes   Time? Yes  Recall of three objects?  Yes  Can perform simple calculations? Yes  Displays appropriate judgment?Yes  Can read the correct time from a watch face?Yes  EOL planning: Does Patient Have a Medical Advance Directive?: Yes Type of Advance Directive: [place] in Chart?: No - copy requested   Objective:  Today's Vitals   12/24/16 0926  BP: 132/80  Pulse: 77  Resp: 16  Temp: 98.2 F (36.8 C)  SpO2: 96%  Weight: 212 lb 12.8 oz (96.5 kg)  Height: 5\' 10"  (1.778 m)  PainSc: 0-No pain   Body mass index is 30.53 kg/m.  General appearance: alert, no distress, WD/WN, male HEENT: normocephalic, sclerae anicteric, TMs pearly, nares patent, no discharge or erythema, pharynx normal Oral cavity: MMM, no lesions Neck: supple, no lymphadenopathy, no thyromegaly, no masses Heart: RRR, normal S1, S2, no murmurs Lungs: CTA bilaterally, no wheezes, rhonchi, or rales Abdomen: +bs, soft, non tender, non distended, no masses, no hepatomegaly, no splenomegaly Musculoskeletal: nontender, no swelling, no obvious deformity Extremities: no edema, no cyanosis, no clubbing Pulses: 2+ symmetric, upper and lower  extremities, normal cap refill Neurological: alert, oriented x 3, CN2-12 intact, strength normal upper extremities and lower extremities, sensation normal throughout, DTRs 2+ throughout, no cerebellar signs, gait normal Psychiatric: normal affect, behavior normal, pleasant   Medicare Attestation I have personally reviewed: The patient's medical and social history Their use of alcohol, tobacco or illicit drugs Their current medications and supplements The patient's functional ability including ADLs,fall risks, home safety risks, cognitive, and hearing and visual impairment Diet and physical activities Evidence for depression or mood disorders  The patient's weight, height, BMI, and visual acuity have been recorded in the chart.  I have made referrals, counseling, and provided education to the patient based on review of the above and I have provided the patient with a written personalized care plan for preventive services.     Vicie Mutters, PA-C   12/24/2016

## 2016-12-24 ENCOUNTER — Encounter: Payer: Self-pay | Admitting: Physician Assistant

## 2016-12-24 ENCOUNTER — Ambulatory Visit (INDEPENDENT_AMBULATORY_CARE_PROVIDER_SITE_OTHER): Payer: Medicare HMO | Admitting: Physician Assistant

## 2016-12-24 VITALS — BP 132/80 | HR 77 | Temp 98.2°F | Resp 16 | Ht 70.0 in | Wt 212.8 lb

## 2016-12-24 DIAGNOSIS — E559 Vitamin D deficiency, unspecified: Secondary | ICD-10-CM

## 2016-12-24 DIAGNOSIS — R6889 Other general symptoms and signs: Secondary | ICD-10-CM | POA: Diagnosis not present

## 2016-12-24 DIAGNOSIS — Z9989 Dependence on other enabling machines and devices: Secondary | ICD-10-CM

## 2016-12-24 DIAGNOSIS — Z79899 Other long term (current) drug therapy: Secondary | ICD-10-CM

## 2016-12-24 DIAGNOSIS — R7303 Prediabetes: Secondary | ICD-10-CM | POA: Diagnosis not present

## 2016-12-24 DIAGNOSIS — Z6830 Body mass index (BMI) 30.0-30.9, adult: Secondary | ICD-10-CM

## 2016-12-24 DIAGNOSIS — G4733 Obstructive sleep apnea (adult) (pediatric): Secondary | ICD-10-CM

## 2016-12-24 DIAGNOSIS — Z0001 Encounter for general adult medical examination with abnormal findings: Secondary | ICD-10-CM

## 2016-12-24 DIAGNOSIS — Z0000 Encounter for general adult medical examination without abnormal findings: Secondary | ICD-10-CM

## 2016-12-24 DIAGNOSIS — K219 Gastro-esophageal reflux disease without esophagitis: Secondary | ICD-10-CM

## 2016-12-24 DIAGNOSIS — I10 Essential (primary) hypertension: Secondary | ICD-10-CM | POA: Diagnosis not present

## 2016-12-24 DIAGNOSIS — Z23 Encounter for immunization: Secondary | ICD-10-CM

## 2016-12-24 DIAGNOSIS — E782 Mixed hyperlipidemia: Secondary | ICD-10-CM | POA: Diagnosis not present

## 2016-12-24 LAB — BASIC METABOLIC PANEL WITH GFR
BUN: 20 mg/dL (ref 7–25)
CHLORIDE: 103 mmol/L (ref 98–110)
CO2: 28 mmol/L (ref 20–32)
Calcium: 9.8 mg/dL (ref 8.6–10.3)
Creat: 1.06 mg/dL (ref 0.70–1.25)
GFR, Est African American: 84 mL/min/{1.73_m2} (ref >=60)
GFR, Est Non African American: 73 mL/min/{1.73_m2} (ref >=60)
GLUCOSE: 98 mg/dL (ref 65–99)
POTASSIUM: 4.5 mmol/L (ref 3.5–5.3)
SODIUM: 138 mmol/L (ref 135–146)

## 2016-12-24 LAB — CBC WITH DIFFERENTIAL/PLATELET
BASOS ABS: 31 {cells}/uL (ref 0–200)
Basophils Relative: 0.7 %
Eosinophils Absolute: 229 cells/uL (ref 15–500)
Eosinophils Relative: 5.2 %
HCT: 41.6 % (ref 38.5–50.0)
Hemoglobin: 14.4 g/dL (ref 13.2–17.1)
LYMPHS ABS: 1122 {cells}/uL (ref 850–3900)
MCH: 30.8 pg (ref 27.0–33.0)
MCHC: 34.6 g/dL (ref 32.0–36.0)
MCV: 89.1 fL (ref 80.0–100.0)
MONOS PCT: 13.8 %
MPV: 9.9 fL (ref 7.5–12.5)
Neutro Abs: 2411 cells/uL (ref 1500–7800)
Neutrophils Relative %: 54.8 %
PLATELETS: 230 10*3/uL (ref 140–400)
RBC: 4.67 10*6/uL (ref 4.20–5.80)
RDW: 11.9 % (ref 11.0–15.0)
TOTAL LYMPHOCYTE: 25.5 %
WBC mixed population: 607 cells/uL (ref 200–950)
WBC: 4.4 10*3/uL (ref 3.8–10.8)

## 2016-12-24 LAB — HEPATIC FUNCTION PANEL
AG Ratio: 2 (calc) (ref 1.0–2.5)
ALKALINE PHOSPHATASE (APISO): 94 U/L (ref 40–115)
ALT: 45 U/L (ref 9–46)
AST: 24 U/L (ref 10–35)
Albumin: 4.3 g/dL (ref 3.6–5.1)
BILIRUBIN INDIRECT: 0.8 mg/dL (ref 0.2–1.2)
Bilirubin, Direct: 0.2 mg/dL (ref 0.0–0.2)
Globulin: 2.2 g/dL (calc) (ref 1.9–3.7)
TOTAL PROTEIN: 6.5 g/dL (ref 6.1–8.1)
Total Bilirubin: 1 mg/dL (ref 0.2–1.2)

## 2016-12-24 LAB — LIPID PANEL
CHOL/HDL RATIO: 3.7 (calc) (ref <5.0)
CHOLESTEROL: 174 mg/dL (ref <200)
HDL: 47 mg/dL (ref >40)
LDL Cholesterol (Calc): 100 mg/dL (calc) — ABNORMAL HIGH
Non-HDL Cholesterol (Calc): 127 mg/dL (calc) (ref <130)
Triglycerides: 163 mg/dL — ABNORMAL HIGH (ref <150)

## 2016-12-24 LAB — TSH: TSH: 2.53 m[IU]/L (ref 0.40–4.50)

## 2016-12-24 LAB — MAGNESIUM: Magnesium: 1.8 mg/dL (ref 1.5–2.5)

## 2016-12-24 NOTE — Patient Instructions (Addendum)
Here is some information to help you keep your heart healthy: Move it! - Aim for 30 mins of activity every day. Take it slowly at first. Talk to [place] before starting any new exercise program.   Lose it.  -Body Mass Index (BMI) can indicate if you need to lose weight. A healthy range is 18.5-24.9. For a BMI calculator, go to [employer]  Waist Management -Excess abdominal fat is a risk factor for heart disease, diabetes, asthma, stroke and more. Ideal waist circumference is less than 35" for women and less than 40" for men.   Eat Right -focus on fruits, vegetables, whole grains, and meals you make yourself. Avoid foods with trans fat and high sugar/sodium content.   Snooze or Snore? - Loud snoring can be a sign of sleep apnea, a significant risk factor for high blood pressure, heart attach, stroke, and heart arrhythmias.  Kick the habit -Quit Smoking! Avoid second hand smoke. A single cigarette raises your blood pressure for 20 mins and increases the risk of heart attack and stroke for the next 24 hours.   Are Aspirin and Supplements right for you? -Add ENTERIC COATED low dose 81 mg Aspirin daily OR can do every other day if you have easy bruising to protect your heart and head. As well as to reduce risk of Colon Cancer by 20 %, Skin Cancer by 26 % , Melanoma by 46% and Pancreatic cancer by 60%  Say "No to Stress -There may be little you can do about problems that cause stress. However, techniques such as long walks, meditation, and exercise can help you manage it.   Start Now! - Make changes one at a time and set reasonable goals to increase your likelihood of success.       Bad carbs also include fruit juice, alcohol, and sweet tea. These are empty calories that do not signal to your brain that you are full.   Please remember the good carbs are still carbs which convert into sugar. So please measure them out no more than 1/2-1 cup of rice, oatmeal, pasta, and beans  Veggies are  however free foods! Pile them on.   Not all fruit is created equal. Please see the list below, the fruit at the bottom is higher in sugars than the fruit at the top. Please avoid all dried fruits.     Monitor your blood pressure at home. Go to the ER if any CP, SOB, nausea, dizziness, severe HA, changes vision/speech  Goal BP:  For patients younger than 60: Goal BP < 140/90. For patients 60 and older: Goal BP < 150/90. For patients with diabetes: Goal BP < 140/90. Your most recent BP: BP: 132/80   Take your medications faithfully as instructed. Maintain a healthy weight. Get at least 150 minutes of aerobic exercise per week. Minimize salt intake. Minimize alcohol intake  DASH Eating Plan DASH stands for "Dietary Approaches to Stop Hypertension." The DASH eating plan is a healthy eating plan that has been shown to reduce high blood pressure (hypertension). Additional health benefits may include reducing the risk of type 2 diabetes mellitus, heart disease, and stroke. The DASH eating plan may also help with weight loss. WHAT DO I NEED TO KNOW ABOUT THE DASH EATING PLAN? For the DASH eating plan, you will follow these general guidelines:  Choose foods with a percent daily value for sodium of less than 5% (as listed on the food label).  Use salt-free seasonings or herbs instead of table salt  or sea salt.  Check with your health care provider or pharmacist before using salt substitutes.  Eat lower-sodium products, often labeled as "lower sodium" or "no salt added."  Eat fresh foods.  Eat more vegetables, fruits, and low-fat dairy products.  Choose whole grains. Look for the word "whole" as the first word in the ingredient list.  Choose fish and skinless chicken or [place] more often than red meat. Limit fish, poultry, and meat to 6 oz (170 g) each day.  Limit sweets, desserts, sugars, and sugary drinks.  Choose heart-healthy fats.  Limit cheese to 1 oz (28 g) per day.  Eat  more home-cooked food and less restaurant, buffet, and fast food.  Limit fried foods.  Cook foods using methods other than frying.  Limit canned vegetables. If you do use them, rinse them well to decrease the sodium.  When eating at a restaurant, ask that your food be prepared with less salt, or no salt if possible. WHAT FOODS CAN I EAT? Seek help from a dietitian for individual calorie needs. Grains Whole grain or whole wheat bread. Brown rice. Whole grain or whole wheat pasta. Quinoa, bulgur, and whole grain cereals. Low-sodium cereals. Corn or whole wheat flour tortillas. Whole grain cornbread. Whole grain crackers. Low-sodium crackers. Vegetables Fresh or frozen vegetables (raw, steamed, roasted, or grilled). Low-sodium or reduced-sodium tomato and vegetable juices. Low-sodium or reduced-sodium tomato sauce and paste. Low-sodium or reduced-sodium canned vegetables.  Fruits All fresh, canned (in natural juice), or frozen fruits. Meat and Other Protein Products Ground beef (85% or leaner), grass-fed beef, or beef trimmed of fat. Skinless chicken or [place]. Ground chicken or [place]. Pork trimmed of fat. All fish and seafood. Eggs. Dried beans, peas, or lentils. Unsalted nuts and seeds. Unsalted canned beans. Dairy Low-fat dairy products, such as skim or 1% milk, 2% or reduced-fat cheeses, low-fat ricotta or cottage cheese, or plain low-fat yogurt. Low-sodium or reduced-sodium cheeses. Fats and Oils Tub margarines without trans fats. Light or reduced-fat mayonnaise and salad dressings (reduced sodium). Avocado. Safflower, olive, or canola oils. Natural peanut or almond butter. Other Unsalted popcorn and pretzels. The items listed above may not be a complete list of recommended foods or beverages. Contact your dietitian for more options. WHAT FOODS ARE NOT RECOMMENDED? Grains White bread. White pasta. White rice. Refined cornbread. Bagels and croissants. Crackers that contain trans  fat. Vegetables Creamed or fried vegetables. Vegetables in a cheese sauce. Regular canned vegetables. Regular canned tomato sauce and paste. Regular tomato and vegetable juices. Fruits Dried fruits. Canned fruit in light or heavy syrup. Fruit juice. Meat and Other Protein Products Fatty cuts of meat. Ribs, chicken wings, bacon, sausage, bologna, salami, chitterlings, fatback, hot dogs, bratwurst, and packaged luncheon meats. Salted nuts and seeds. Canned beans with salt. Dairy Whole or 2% milk, cream, half-and-half, and cream cheese. Whole-fat or sweetened yogurt. Full-fat cheeses or blue cheese. Nondairy creamers and whipped toppings. Processed cheese, cheese spreads, or cheese curds. Condiments Onion and garlic salt, seasoned salt, table salt, and sea salt. Canned and packaged gravies. Worcestershire sauce. Tartar sauce. Barbecue sauce. Teriyaki sauce. Soy sauce, including reduced sodium. Steak sauce. Fish sauce. Oyster sauce. Cocktail sauce. Horseradish. Ketchup and mustard. Meat flavorings and tenderizers. Bouillon cubes. Hot sauce. Tabasco sauce. Marinades. Taco seasonings. Relishes. Fats and Oils Butter, stick margarine, lard, shortening, ghee, and bacon fat. Coconut, palm kernel, or palm oils. Regular salad dressings. Other Pickles and olives. Salted popcorn and pretzels. The items listed above may not be a  complete list of foods and beverages to avoid. Contact your dietitian for more information. WHERE CAN I FIND MORE INFORMATION? National [employer], [employer], and [employer] Institute: [employer] Document Released: 02/08/2011 Document Revised: 07/06/2013 Document Reviewed: 12/24/2012 [facility] Patient Information 2015 [place], [place]. This information is not intended to replace advice given to you by your health care provider. Make sure you discuss any questions you have with your health care provider.

## 2017-01-05 DIAGNOSIS — G4733 Obstructive sleep apnea (adult) (pediatric): Secondary | ICD-10-CM | POA: Diagnosis not present

## 2017-02-04 DIAGNOSIS — G4733 Obstructive sleep apnea (adult) (pediatric): Secondary | ICD-10-CM | POA: Diagnosis not present

## 2017-02-20 ENCOUNTER — Other Ambulatory Visit: Payer: Self-pay | Admitting: Internal Medicine

## 2017-03-07 DIAGNOSIS — G4733 Obstructive sleep apnea (adult) (pediatric): Secondary | ICD-10-CM | POA: Diagnosis not present

## 2017-03-17 ENCOUNTER — Other Ambulatory Visit: Payer: Self-pay | Admitting: Internal Medicine

## 2017-04-01 ENCOUNTER — Other Ambulatory Visit: Payer: Self-pay | Admitting: Internal Medicine

## 2017-04-07 DIAGNOSIS — G4733 Obstructive sleep apnea (adult) (pediatric): Secondary | ICD-10-CM | POA: Diagnosis not present

## 2017-04-09 DIAGNOSIS — Q100 Congenital ptosis: Secondary | ICD-10-CM | POA: Diagnosis not present

## 2017-04-24 ENCOUNTER — Ambulatory Visit (INDEPENDENT_AMBULATORY_CARE_PROVIDER_SITE_OTHER): Payer: Medicare HMO | Admitting: Physician Assistant

## 2017-04-24 ENCOUNTER — Encounter: Payer: Self-pay | Admitting: Physician Assistant

## 2017-04-24 VITALS — BP 126/70 | HR 68 | Temp 98.2°F | Resp 14 | Ht 70.0 in | Wt 219.4 lb

## 2017-04-24 DIAGNOSIS — M25561 Pain in right knee: Secondary | ICD-10-CM | POA: Diagnosis not present

## 2017-04-24 MED ORDER — BACLOFEN 10 MG PO TABS: 10.0000 mg | ORAL_TABLET | Freq: Every evening | ORAL | 1 refills | Status: DC | PRN

## 2017-04-24 MED ORDER — MELOXICAM 15 MG PO TABS: ORAL_TABLET | ORAL | 1 refills | Status: DC

## 2017-04-24 MED ORDER — SILDENAFIL CITRATE 100 MG PO TABS: ORAL_TABLET | ORAL | 2 refills | Status: DC

## 2017-04-24 NOTE — Progress Notes (Signed)
Subjective:    Patient ID: Jason Dennis, male    DOB: Jul 02, 1950, 67 y.o.   MRN: [id number]  HPI 67 y.o. WM with right knee pain x 1 month.  He has been trying to walk more but no injury to the knee. He has medial right knee pain, slightly sore during the day but much worse at night. He keeps pillow between his knee at night, worse with lying on left side, better with lying on right side.  Some popping in his knee, no locking up on him. No swelling, redness.  He has some lower back pain, no hip pain, has had previous back surgery 1994.  He has been using "bluemu" cream on it to help.   Blood pressure 126/70, pulse 68, temperature 98.2 F (36.8 C), resp. rate 14, height 5\' 10"  (1.778 m), weight 219 lb 6.4 oz (99.5 kg), SpO2 98 %.  Medications Current Outpatient Medications on File Prior to Visit  Medication Sig  . atorvastatin (LIPITOR) 80 MG tablet TAKE ONE TABLET BY MOUTH ONCE DAILY FOR  CHOLESTEROL  . cholecalciferol (VITAMIN D) 1000 UNITS tablet Take 5,000 Units by mouth daily.   . meclizine (ANTIVERT) 25 MG tablet TAKE 1 TABLET BY MOUTH THREE TIMES DAILY AS NEEDED FOR DIZZINESS OR VERTIGO  . meloxicam (MOBIC) 15 MG tablet TAKE ONE TABLET BY MOUTH ONCE DAILY AFTER A MEAL FOR ACHES AND PAINS  . Omega-3 Fatty Acids (FISH OIL) 1000 MG CAPS Take by mouth.  Marland Kitchen omeprazole (PRILOSEC) 20 MG capsule Take 20 mg by mouth daily.  . phentermine (ADIPEX-P) 37.5 MG tablet TAKE 1/2 TO 1 (ONE-HALF TO ONE) TABLET BY MOUTH ONCE DAILY IN THE MORNING FOR WEIGHT LOSS   No current facility-administered medications on file prior to visit.     Problem list He has Hyperlipidemia; Hypertension; Prediabetes; Vitamin D deficiency; OSA on CPAP; GERD (gastroesophageal reflux disease); and Medication management on their problem list.   Review of Systems  Musculoskeletal: Positive for arthralgias. Negative for gait problem and joint swelling.  Skin: Negative for pallor and rash.       Objective:   Physical  Exam  Constitutional: He is oriented to person, place, and time. He appears well-developed and well-nourished.  Cardiovascular: Normal rate and regular rhythm.  Pulmonary/Chest: Effort normal and breath sounds normal.  Musculoskeletal: Normal range of motion.  Patient is able to ambulate well.   Gait is not  antalgic. right knee with crepitus, ACL laxity noted, MCL laxity noted and tenderness noted MCL,  no effusion, no erythema, LCL stable, no patellar laxity, McMurray's negative and FROM Some right SI tenderness, FROM right hip Good distal neurovascular exam  Neurological: He is alert and oriented to person, place, and time. He has normal reflexes.  Skin: No rash noted.       Assessment & Plan:    Acute pain of right knee -     DG Knee Complete 4 Views Right; Future -     meloxicam (MOBIC) 15 MG tablet; Take one daily with food for 2 weeks, can take with tylenol, can not take with aleve, iburpofen, then as needed daily for pain -     baclofen (LIORESAL) 10 MG tablet; Take 1 tablet (10 mg total) by mouth at bedtime as needed for muscle spasms (knee pain). - likely arthritis with MCL/ACL laxity, if not better with NSAIDS/exercise/brace will refer to ortho  Other orders -     sildenafil (VIAGRA) 100 MG tablet; 1/2-1 pill as needed 1  hour before intercourse, can get with good RX from [employer]

## 2017-04-24 NOTE — Patient Instructions (Signed)
Esguince del ligamento lateral interno de la rodilla con rehabilitacin faseI (Medial Collateral Knee Ligament Sprain With Phase I Rehab) El ligamento lateral interno (LLI) es una banda resistente de tejido de la rodilla que conecta el fmur con la tibia. El LLI evita que la rodilla se mueva demasiado hacia adentro y la mantiene estable. Un esquince del LLI es una lesin provocada como consecuencia del estiramiento excesivo de este ligamento. La lesin puede implicar un desgarro del ligamento. CAUSAS Esta afeccin puede ser causada por lo siguiente:  Un golpe fuerte y directo en la parte interna de la rodilla (frecuente).  Que la rodilla se doble hacia adentro al correr, Quarry manager sbitamente de direccin, saltar o girar.  Estirar el ligamento demasiado en reiteradas ocasiones. [place] factores pueden hacer que usted sea ms propenso a sufrir esta afeccin:  Careers information officer de contacto, como lucha libre o ftbol americano.  Practicar deportes que impliquen correr y Quarry manager de direccin sbitamente, como hockey, esqu o ftbol.  Tener debilidad en la cadera y en los msculos del torso. SNTOMAS Los sntomas de esta afeccin incluyen lo siguiente:  Un chasquido que se produce en el momento de la lesin.  Dolor en la parte interna de la rodilla.  Hinchazn en la rodilla.  Hematoma alrededor de la rodilla.  Dolor a la palpacin al costado interno de la rodilla.  Inestabilidad al pararse, como si la rodilla fuera a doblarse.  Dificultad al caminar por superficies irregulares. DIAGNSTICO Esta afeccin se puede diagnosticar en funcin de lo siguiente:  Sus antecedentes mdicos.  Un examen fsico.  Estudios, como una radiografa o una resonancia magntica (RM). Durante el examen fsico, el mdico lo examinar para verificar si le duele, y si ha perdido el movimiento y la estabilidad. TRATAMIENTO El tratamiento de esta afeccin depende de la gravedad  de la lesin. El tratamiento puede incluir lo siguiente:  Theatre manager la rodilla sin peso hasta que la hinchazn y Conservation officer, historic buildings se [place].  Levantar (elevar) la rodilla por encima del nivel del corazn. Esto ayuda a Forensic psychologist.  Aplicar hielo sobre la rodilla. Esto ayuda a Forensic psychologist.  Tomar un antiinflamatorio no esteroide Dayna Ramus). Esto ayuda a Best boy y la hinchazn.  Usar un dispositivo ortopdico, rodillera o Retail banker la lesin sana.  Usar Ardelia Mems rodillera al realizar actividades deportivas.  Realizar ejercicios de rehabilitacin (fisioterapia).  Someterse a [place]. Esta puede ser necesaria en los siguientes casos: ? El ligamento se desgarra por completo. ? La rodilla est inestable. ? La rodilla no mejora con otros tratamientos. INSTRUCCIONES PARA EL CUIDADO EN EL HOGAR Si tiene un dispositivo ortopdico o una rodillera:  selos como se lo haya indicado el mdico. Quteselos solamente como se lo haya indicado el mdico.  Afloje el dispositivo ortopdico o qutese la rodillera si los dedos de los pies se le entumecen, si siente hormigueos, o si se le enfran y se tornan de Optician, dispensing.  No deje que el dispositivo ortopdico o la rodillera se mojen si no son impermeables.  Mantenga el dispositivo ortopdico o la rodillera limpios. Control del dolor, la rigidez y la hinchazn  Si se lo indican, aplique hielo en la parte interna de la rodilla. ? Ponga el hielo en una bolsa plstica. ? Coloque una [employer] piel y la bolsa de hielo. ? Coloque el hielo durante 10minutos, 2 a 3veces por da.  Mueva el pie y los dedos del pie con frecuencia para evitar  que se entumezcan y para reducir la hinchazn.  Cuando est sentado o acostado, eleve la rodilla por encima del nivel del corazn. Conducir  Pregntele al mdico cundo puede volver a conducir si tiene un dispositivo ortopdico o una rodillera en la pierna. Actividad  Retome sus  actividades normales como se lo haya indicado el mdico. Pregntele al mdico qu actividades son seguras para usted.  Haga ejercicios como se lo haya indicado el mdico. Seguridad  No apoye el peso del cuerpo sobre la extremidad [employer] que lo autorice el mdico. Use muletas como se lo haya indicado el mdico. Instrucciones generales  [employer] medicamentos de venta libre y los recetados solamente como se lo haya indicado el mdico.  Concurra a todas las visitas de control como se lo haya indicado el mdico. Esto es importante. PREVENCIN  Precaliente y elongue adecuadamente antes de la [place].  Reljese y elongue despus de realizar [place].  Dele a su cuerpo tiempo para [employer] perodos de [place].  Asegrese de usar el equipo que sea apto para usted.  Tome medidas de seguridad y sea responsable al hacer [place], para evitar las cadas.  Haga por lo menos 154minutos de ejercicios de intensidad moderada cada semana, como caminar a paso ligero o hacer gimnasia acutica.  Mantenga un buen estado fsico, esto incluye lo siguiente: ? La fuerza. ? La flexibilidad. ? La capacidad cardiovascular. ? La resistencia. SOLICITE ATENCIN MDICA SI:  Los sntomas no mejoran.  Los sntomas empeoran. Esta informacin no tiene Marine scientist el consejo del mdico. Asegrese de hacerle al mdico cualquier pregunta que tenga. Document Released: 12/06/2005 Document Revised: 07/06/2014 Document Reviewed: 01/01/2015 Elsevier Interactive Patient Education  2018 [place].   Medial Collateral Knee Ligament Sprain, Phase I Rehab Ask your health care provider which exercises are safe for you. Do exercises exactly as told by your health care provider and adjust them as directed. It is normal to feel mild stretching, pulling, tightness, or discomfort as you do these exercises, but you should stop right away if you feel sudden pain or your pain gets  worse.Do not begin these exercises until told by your health care provider. Stretching and range of motion exercises These exercises warm up your muscles and joints and improve the movement and flexibility of your knee. These exercises also help to relieve pain, numbness, and tingling. Exercise A: Knee flexion, passive 1. Start this exercise in one of these positions: ? Lying on the floor in front of an open doorway, with your left / right heel and foot lightly touching the wall. ? Lying on the floor with both feet on the wall. 2. Without using any effort, allow gravity to let your foot slide down the wall slowly until you feel a gentle stretch in the front of your left / right knee. 3. Hold this stretch for __________ seconds. 4. Return your leg to the starting position, using your healthy leg to do the work or to help if needed. Repeat __________ times. Complete this stretch __________ times a day. Exercise B: Knee flexion, active  1. Lie on your back with both knees straight. If this causes back discomfort, bend your healthy knee so your foot is flat on the floor. 2. Slowly slide your left / right heel back toward your buttocks until you feel a gentle stretch in the front of your knee or thigh. 3. Hold this position for __________ seconds. 4. Slowly slide your left / right heel back  to the starting position. Repeat __________ times. Complete this exercise __________ times a day. Exercise C: Knee extension, sitting 1. Sit with your left / right heel propped on a chair, a coffee table, or a footstool. Do not have anything under your knee to support it. 2. Allow your leg muscles to relax, letting gravity straighten out your knee. Do not let your knee roll inward. You should feel a stretch behind your left / right knee. 3. If told by your health care provider, deepen the stretch by placing a __________ weight on your thigh, just above your kneecap. 4. Hold this position for __________  seconds. Repeat __________ times. Complete this stretch __________ times a day. Strengthening exercises These exercises build strength and endurance in your knee. Endurance is the ability to use your muscles for a long time, even after they get tired. Isometric exercises involve squeezing your muscles but not moving your knee. Exercise D: Quadriceps, isometric  1. Lie on your back with your left / right leg extended and your other knee bent. 2. If told by your health care provider, put a rolled towel or small pillow under your left / right knee. 3. Slowly tense the muscles in the front of your left / right thigh by pushing your knee down. You should see your kneecap slide up toward your hip or see increased dimpling just above the knee. 4. For __________ seconds, keep the muscle as tight as you can without increasing your pain. 5. Relax your muscles slowly and completely. Repeat __________ times. Complete this exercise __________ times a day. Exercise E: Hamstring, isometric  1. Lie on your back on a firm surface. 2. Bend your left / right knee about __________ degrees. You can prop your knee on a pillow if needed. 3. Dig your heel down and back into the surface as if you are trying to pull your heel toward your buttocks. Tighten the muscles in the back of your thighs to "dig" as hard as you can without increasing any pain. 4. Hold this position for __________ seconds. 5. Relax your muscles slowly and completely. Repeat __________ times. Complete this exercise __________ times a day. This information is not intended to replace advice given to you by your health care provider. Make sure you discuss any questions you have with your health care provider. Document Released: 02/19/2005 Document Revised: 10/27/2015 Document Reviewed: 01/01/2015 Elsevier Interactive Patient Education  2018 [place].   Anterior Cruciate Ligament Tear Rehab Ask your health care provider which exercises are safe  for you. Do exercises exactly as told by your health care provider and adjust them as directed. It is normal to feel mild stretching, pulling, tightness, or discomfort as you do these exercises, but you should stop right away if you feel sudden pain or your pain gets worse.Do not begin these exercises until told by your health care provider. Stretching and range of motion exercises These exercises warm up your muscles and joints and improve the movement and flexibility of your knee. These exercises also help to relieve pain and stiffness. Exercise A: Knee flexion, active  1. Lie on your back with both knees straight. If this causes back discomfort, bend your healthy knee so your foot is flat on the floor. 2. Slowly slide your left / right heel back toward your buttocks until you feel a gentle stretch in the front of your knee or thigh. 3. Hold for __________ seconds. 4. Slowly slide your left / right heel back to the  starting position. Repeat __________ times. Complete this exercise __________ times a day. Exercise B: Knee extension, sitting  1. Sit with your left / right heel propped up on a chair, a coffee table, or a footstool. Do not have anything under your knee to support it. 2. Allow your leg muscles to relax, letting gravity straighten out your knee. You should feel a stretch behind your left / right knee. 3. If told by your health care provider, deepen the stretch by placing a __________ lb weight on your thigh, just above your kneecap. 4. Hold this position for __________ seconds. 5. Rest your left / right foot on the floor and allow your muscles to relax after each repetition. Repeat __________ times. Complete this stretch __________ times a day. Strengthening exercises These exercises build strength and endurance in your knee. Endurance is the ability to use your muscles for a long time, even after they get tired. Exercise C: Hamstring curls  If told by your health care provider,  do this exercise while wearing ankle weights. Begin with __________ weights. Then increase the weight by 1 lb (0.5 kg) increments. Do not wear ankle weights that are more than __________. 1. Lie on your abdomen with your legs straight. You can put a towel above your left / right knee for comfort. 2. Bend your left / right knee up to __________ degrees. Keep your hips flat. 3. Hold this position for __________ seconds. 4. Slowly lower your leg to the starting position. Repeat __________ times. Complete this exercise __________ times a day. Exercise D: Straight leg raises ( quadriceps) 1. Lie on your back with your left / right leg extended and your other knee bent. 2. Tense the muscles in the front of your left / right thigh. You should see your kneecap slide up or see increased dimpling just above your knee. 3. Keep these muscles tight as you raise your leg 4-6 inches (10-15 cm) off the floor. 4. Hold for __________ seconds. 5. Keep these muscles tense as you lower your leg. 6. Relax your muscles slowly and completely after each repetition. Repeat __________ times. Complete this exercise __________ times a day. Exercise E: Bridge ( hip extensors) 1. Lie on your back on a firm surface with your knees bent and your feet flat on the floor. 2. Tighten your buttocks muscles and lift your bottom off the floor until your trunk is level with your thighs. ? Do not arch your back. ? You should feel the muscles working in your buttocks and the back of your thighs. If you do not feel a stretch in those muscles, slide your feet 1-2 inches (2.5-5 cm) farther away from your buttocks. 3. Hold this position for __________ seconds. 4. Slowly lower your hips to the starting position. 5. Let your buttocks muscles relax completely between repetitions. 6. If this exercise is too easy, try doing it with your arms crossed over your chest. Repeat __________ times. Complete this exercise __________ times a  day. Exercise F: Straight leg raises ( hip abductors) 1. Lie on your side with your left / right leg in the top position. Lie so your head, shoulder, knee, and hip line up. You may bend your bottom knee to help you balance. 2. Lift your top leg 4-6 inches (10-15 cm) while keeping your toes pointed straight ahead. 3. Hold this position for __________ seconds. 4. Slowly lower your leg to the starting position. 5. Let your muscles relax completely after each repetition. Repeat __________ times.  Complete this exercise __________ times a day. This information is not intended to replace advice given to you by your health care provider. Make sure you discuss any questions you have with your health care provider. Document Released: 09/20/2004 Document Revised: 10/27/2015 Document Reviewed: 02/05/2015 Elsevier Interactive Patient Education  2017 [employer].

## 2017-04-29 ENCOUNTER — Ambulatory Visit (HOSPITAL_COMMUNITY)
Admission: RE | Admit: 2017-04-29 | Discharge: 2017-04-29 | Disposition: A | Discharge status: Home / Self Care | Payer: Medicare HMO | Source: Ambulatory Visit | Attending: Physician Assistant | Admitting: Physician Assistant

## 2017-04-29 DIAGNOSIS — M25561 Pain in right knee: Secondary | ICD-10-CM | POA: Insufficient documentation

## 2017-05-05 DIAGNOSIS — G4733 Obstructive sleep apnea (adult) (pediatric): Secondary | ICD-10-CM | POA: Diagnosis not present

## 2017-05-16 DIAGNOSIS — R69 Illness, unspecified: Secondary | ICD-10-CM | POA: Diagnosis not present

## 2017-05-21 ENCOUNTER — Other Ambulatory Visit: Payer: Self-pay | Admitting: Internal Medicine

## 2017-06-11 DIAGNOSIS — R69 Illness, unspecified: Secondary | ICD-10-CM | POA: Diagnosis not present

## 2017-06-27 ENCOUNTER — Encounter: Payer: Self-pay | Admitting: Internal Medicine

## 2017-07-18 ENCOUNTER — Ambulatory Visit (INDEPENDENT_AMBULATORY_CARE_PROVIDER_SITE_OTHER): Payer: Medicare HMO | Admitting: Internal Medicine

## 2017-07-18 ENCOUNTER — Encounter: Payer: Self-pay | Admitting: Internal Medicine

## 2017-07-18 VITALS — BP 112/74 | HR 68 | Temp 97.8°F | Resp 16 | Ht 70.0 in | Wt 216.0 lb

## 2017-07-18 DIAGNOSIS — Z8249 Family history of ischemic heart disease and other diseases of the circulatory system: Secondary | ICD-10-CM

## 2017-07-18 DIAGNOSIS — K219 Gastro-esophageal reflux disease without esophagitis: Secondary | ICD-10-CM | POA: Diagnosis not present

## 2017-07-18 DIAGNOSIS — E782 Mixed hyperlipidemia: Secondary | ICD-10-CM | POA: Diagnosis not present

## 2017-07-18 DIAGNOSIS — Z136 Encounter for screening for cardiovascular disorders: Secondary | ICD-10-CM

## 2017-07-18 DIAGNOSIS — N401 Enlarged prostate with lower urinary tract symptoms: Secondary | ICD-10-CM

## 2017-07-18 DIAGNOSIS — I10 Essential (primary) hypertension: Secondary | ICD-10-CM | POA: Diagnosis not present

## 2017-07-18 DIAGNOSIS — R7309 Other abnormal glucose: Secondary | ICD-10-CM | POA: Diagnosis not present

## 2017-07-18 DIAGNOSIS — N138 Other obstructive and reflux uropathy: Secondary | ICD-10-CM

## 2017-07-18 DIAGNOSIS — B354 Tinea corporis: Secondary | ICD-10-CM

## 2017-07-18 DIAGNOSIS — E559 Vitamin D deficiency, unspecified: Secondary | ICD-10-CM

## 2017-07-18 DIAGNOSIS — Z0001 Encounter for general adult medical examination with abnormal findings: Secondary | ICD-10-CM

## 2017-07-18 DIAGNOSIS — Z87891 Personal history of nicotine dependence: Secondary | ICD-10-CM

## 2017-07-18 DIAGNOSIS — Z79899 Other long term (current) drug therapy: Secondary | ICD-10-CM | POA: Diagnosis not present

## 2017-07-18 DIAGNOSIS — R7303 Prediabetes: Secondary | ICD-10-CM

## 2017-07-18 DIAGNOSIS — Z0000 Encounter for general adult medical examination without abnormal findings: Secondary | ICD-10-CM | POA: Diagnosis not present

## 2017-07-18 MED ORDER — TERBINAFINE HCL 250 MG PO TABS
ORAL_TABLET | ORAL | 2 refills | Status: DC
Start: 2017-07-18 — End: 2018-02-10

## 2017-07-18 NOTE — Progress Notes (Signed)
[facility]   [name], M.D.     Uvaldo Bristle. Silverio Lay, P.A.-C Liane Comber, [place]                [address] [place], [place] [address] Telephone [phone] Telefax [phone] Annual  Screening/Preventative Visit  & Comprehensive Evaluation & Examination     This very nice 67 y.o. MWM  presents for a Screening/Preventative Visit & comprehensive evaluation and management of multiple medical co-morbidities.  Patient has been followed for HTN, HLD, Prediabetes and Vitamin D Deficiency. Patient has GERD controlled on his meds.     HTN predates circa 2016. Patient's BP has been controlled at home.  Today's BP is at goal - 112/74. Patient denies any cardiac symptoms as chest pain, palpitations, shortness of breath, dizziness or ankle swelling.     Patient's hyperlipidemia is controlled with diet and medications. Patient denies myalgias or other medication SE's. Last lipids were  Lab Results  Component Value Date   CHOL 174 12/24/2016   HDL 47 12/24/2016   LDLCALC 100 (H) 12/24/2016   TRIG 163 (H) 12/24/2016   CHOLHDL 3.7 12/24/2016      Patient has prediabetes (A1c 5.9%/2011) and patient denies reactive hypoglycemic symptoms, visual blurring, diabetic polys or paresthesias. Last A1c was Normal& at goal: Lab Results  Component Value Date   HGBA1C 5.3 05/31/2016       Finally, patient has history of Vitamin D Deficiency ("31"/2008)  and last vitamin D was near goal" Lab Results  Component Value Date   VD25OH 55 05/31/2016   Current Outpatient Medications on File Prior to Visit  Medication Sig  . atorvastatin (LIPITOR) 80 MG tablet TAKE 1 TABLET BY MOUTH ONCE DAILY FOR  CHOLESTEROL  . cholecalciferol (VITAMIN D) 1000 UNITS tablet Take 5,000 Units by mouth daily.   . Omega-3 Fatty Acids (FISH OIL) 1000 MG CAPS Take by mouth.  Marland Kitchen omeprazole (PRILOSEC) 20 MG capsule Take 20 mg by mouth  daily.  . sildenafil (VIAGRA) 100 MG tablet 1/2-1 pill as needed 1 hour before intercourse, can get with good RX from [facility] (Patient not taking: Reported on 07/18/2017)   No current facility-administered medications on file prior to visit.    No Known Allergies Past Medical History:  Diagnosis Date  . GERD (gastroesophageal reflux disease)   . Hyperlipidemia   . Obesity   . Prediabetes   . Sleep apnea    CPAP  . Vitamin D deficiency    Health Maintenance  Topic Date Due  . INFLUENZA VACCINE  10/03/2017  . COLONOSCOPY  05/02/2021  . TETANUS/TDAP  11/10/2024  . Hepatitis C Screening  Completed  . PNA vac Low Risk Adult  Completed   Immunization History  Administered Date(s) Administered  . Influenza, High Dose Seasonal PF 10/21/2015, 12/24/2016  . PPD Test 11/10/2013, 11/11/2014  . Pneumococcal Conjugate-13 10/21/2015  . Pneumococcal Polysaccharide-23 05/31/2016  . Pneumococcal-Unspecified 04/15/2009  . Td 11/17/2004  . Tdap 11/11/2014  . Zoster 10/28/2012   Last Colon - 2.18.2018 - Dr Ardis Hughs  Recc 5 yr f/u 04/2021.  Past Surgical History:  Procedure Laterality Date  . SPINE SURGERY     [phone]   Family History  Problem Relation Age of Onset  . Hypertension Mother   . Heart disease Mother   . COPD Mother   . Heart failure Mother   .  Heart failure Father   . Colon cancer Father        died at 20;pt unsure age,never knew father  . Diabetes Brother    Social History   Socioeconomic History  . Marital status: Married    Spouse name: Clarene Critchley  . Number of children: 2 daughters & 1 Gson  Occupational History  . retired from Medical illustrator.  Tobacco Use  . Smoking status: Former Smoker    Packs/day: 1.00    Years: 32.00    Pack years: 32.00    Types: Cigarettes    Last attempt to quit: 02/03/1990    Years since quitting: 27.4  . Smokeless tobacco: Never Used  Substance and Sexual Activity  . Alcohol use: Yes    Alcohol/week: 13.2 oz     Types: 14 Standard drinks or equivalent, 7 Cans of beer, 1 Shots of liquor per week    Comment: 7 beers a week plus 1 mixed drink daily.  . Drug use: No    ROS Constitutional: Denies fever, chills, weight loss/gain, headaches, insomnia,  night sweats or change in appetite. Does c/o fatigue. Eyes: Denies redness, blurred vision, diplopia, discharge, itchy or watery eyes.  ENT: Denies discharge, congestion, post nasal drip, epistaxis, sore throat, earache, hearing loss, dental pain, Tinnitus, Vertigo, Sinus pain or snoring.  Cardio: Denies chest pain, palpitations, irregular heartbeat, syncope, dyspnea, diaphoresis, orthopnea, PND, claudication or edema Respiratory: denies cough, dyspnea, DOE, pleurisy, hoarseness, laryngitis or wheezing.  Gastrointestinal: Denies dysphagia, heartburn, reflux, water brash, pain, cramps, nausea, vomiting, bloating, diarrhea, constipation, hematemesis, melena, hematochezia, jaundice or hemorrhoids Genitourinary: Denies dysuria,  discharge, hematuria or flank pain. Does endorse occas ur.frequency, urgency, nocturia x 2-3 and hesitancy. Musculoskeletal: Denies arthralgia, myalgia, stiffness, Jt. Swelling, pain, limp or strain/sprain. Denies Falls. Skin: Denies puritis, rash, hives, warts, acne, eczema or change in skin lesion Neuro: No weakness, tremor, incoordination, spasms, paresthesia or pain Psychiatric: Denies confusion, memory loss or sensory loss. Denies Depression. Endocrine: Denies change in weight, skin, hair change, nocturia, and paresthesia, diabetic polys, visual blurring or hyper / hypo glycemic episodes.  Heme/Lymph: No excessive bleeding, bruising or enlarged lymph nodes.  Physical Exam  BP 112/74   Pulse 68   Temp 97.8 F (36.6 C)   Resp 16   Ht 5\' 10"  (1.778 m)   Wt 216 lb (98 kg)   BMI 30.99 kg/m   General Appearance: Well nourished and well groomed and in no apparent distress.  Eyes: PERRLA, EOMs, conjunctiva no swelling or erythema,  normal fundi and vessels. Sinuses: No frontal/maxillary tenderness ENT/Mouth: EACs patent / TMs  nl. Nares clear without erythema, swelling, mucoid exudates. Oral hygiene is good. No erythema, swelling, or exudate. Tongue normal, non-obstructing. Tonsils not swollen or erythematous. Hearing normal.  Neck: Supple, thyroid not palpable. No bruits, nodes or JVD. Respiratory: Respiratory effort normal.  BS equal and clear bilateral without rales, rhonci, wheezing or stridor. Cardio: Heart sounds are normal with regular rate and rhythm and no murmurs, rubs or gallops. Peripheral pulses are normal and equal bilaterally without edema. No aortic or femoral bruits. Chest: symmetric with normal excursions and percussion.  Abdomen: Soft, with Nl bowel sounds. Nontender, no guarding, rebound, hernias, masses, or organomegaly.  Lymphatics: Non tender without lymphadenopathy.  Genitourinary:   DRE - deferred to recent Colonoscopy. Musculoskeletal: Full ROM all peripheral extremities, joint stability, 5/5 strength, and normal gait. Skin: Warm and dry without , lesions, cyanosis, clubbing or  ecchymosis. Has a rash suspect for  T.[place] of Lt thigh & groin Neuro: Cranial nerves intact, reflexes equal bilaterally. Normal muscle tone, no cerebellar symptoms. Sensation intact.  Pysch: Alert and oriented X 3 with normal affect, insight and judgment appropriate.   Assessment and Plan  1. Annual Preventative/Screening Exam   2. Essential hypertension  - EKG 12-Lead - [place], RETROPERITNL ABD,  LTD - Urinalysis, Routine w reflex microscopic - Microalbumin / creatinine urine ratio - CBC with Differential/Platelet - COMPLETE METABOLIC PANEL WITH GFR - Magnesium - TSH  3. Hyperlipidemia, mixed  - EKG 12-Lead - [place], RETROPERITNL ABD,  LTD - Lipid panel - TSH  4. Abnormal glucose  - EKG 12-Lead - [place], RETROPERITNL ABD,  LTD - Hemoglobin A1c - Insulin, random  5. Vitamin D deficiency  - VITAMIN D 25  Hydroxyl  6. Prediabetes  - EKG 12-Lead - [place], RETROPERITNL ABD,  LTD - Hemoglobin A1c - Insulin, random  7. Gastroesophageal reflux disease  - CBC with Differential/Platelet  8. Screening for ischemic heart disease  - EKG 12-Lead  9. Screening for AAA (aortic abdominal aneurysm)  - [place], RETROPERITNL ABD,  LTD  10. Former smoker  - EKG 12-Lead - [place], RETROPERITNL ABD,  LTD  11. FHx: heart disease  - EKG 12-Lead - [place], RETROPERITNL ABD,  LTD  12. Medication management  - Urinalysis, Routine w reflex microscopic - Microalbumin / creatinine urine ratio - CBC with Differential/Platelet - COMPLETE METABOLIC PANEL WITH GFR - Magnesium - Lipid panel - TSH - Hemoglobin A1c - Insulin, random - VITAMIN D 25 Hydroxyl  13. BPH with obstruction/lower urinary tract symptoms  - PSA  14. Tinea corporis  - terbinafine (LAMISIL) 250 MG tablet; Take 1 tablet daily for skin fungus  Dispense: 30 tablet; Refill: 2           Patient was counseled in prudent diet, weight control to achieve/maintain BMI less than 25, BP monitoring, regular exercise and medications as discussed.  Discussed med effects and SE's. Routine screening labs and tests as requested with regular follow-up as recommended. Over 40 minutes of exam, counseling, chart review and high complex critical decision making was performed

## 2017-07-18 NOTE — Patient Instructions (Signed)

## 2017-07-19 ENCOUNTER — Other Ambulatory Visit: Payer: Self-pay | Admitting: Physician Assistant

## 2017-07-19 LAB — LIPID PANEL
CHOL/HDL RATIO: 2.9 (calc) (ref <5.0)
Cholesterol: 170 mg/dL (ref <200)
HDL: 59 mg/dL (ref >40)
LDL Cholesterol (Calc): 88 mg/dL (calc)
NON-HDL CHOLESTEROL (CALC): 111 mg/dL (ref <130)
TRIGLYCERIDES: 124 mg/dL (ref <150)

## 2017-07-19 LAB — CBC WITH DIFFERENTIAL/PLATELET
BASOS PCT: 0.5 %
Basophils Absolute: 28 cells/uL (ref 0–200)
Eosinophils Absolute: 292 cells/uL (ref 15–500)
Eosinophils Relative: 5.3 %
HCT: 42.3 % (ref 38.5–50.0)
Hemoglobin: 14.7 g/dL (ref 13.2–17.1)
Lymphs Abs: 1238 cells/uL (ref 850–3900)
MCH: 30.9 pg (ref 27.0–33.0)
MCHC: 34.8 g/dL (ref 32.0–36.0)
MCV: 88.9 fL (ref 80.0–100.0)
MONOS PCT: 9.7 %
MPV: 10 fL (ref 7.5–12.5)
NEUTROS ABS: 3410 {cells}/uL (ref 1500–7800)
Neutrophils Relative %: 62 %
Platelets: 248 10*3/uL (ref 140–400)
RBC: 4.76 10*6/uL (ref 4.20–5.80)
RDW: 12.2 % (ref 11.0–15.0)
Total Lymphocyte: 22.5 %
WBC: 5.5 10*3/uL (ref 3.8–10.8)
WBCMIX: 534 {cells}/uL (ref 200–950)

## 2017-07-19 LAB — COMPLETE METABOLIC PANEL WITH GFR
AG Ratio: 2.5 (calc) (ref 1.0–2.5)
ALT: 40 U/L (ref 9–46)
AST: 29 U/L (ref 10–35)
Albumin: 4.7 g/dL (ref 3.6–5.1)
Alkaline phosphatase (APISO): 95 U/L (ref 40–115)
BUN: 18 mg/dL (ref 7–25)
CALCIUM: 9.7 mg/dL (ref 8.6–10.3)
CHLORIDE: 104 mmol/L (ref 98–110)
CO2: 29 mmol/L (ref 20–32)
CREATININE: 0.85 mg/dL (ref 0.70–1.25)
GFR, EST AFRICAN AMERICAN: 105 mL/min/{1.73_m2} (ref >=60)
GFR, EST NON AFRICAN AMERICAN: 91 mL/min/{1.73_m2} (ref >=60)
GLUCOSE: 89 mg/dL (ref 65–99)
Globulin: 1.9 g/dL (calc) (ref 1.9–3.7)
Potassium: 4.3 mmol/L (ref 3.5–5.3)
Sodium: 139 mmol/L (ref 135–146)
TOTAL PROTEIN: 6.6 g/dL (ref 6.1–8.1)
Total Bilirubin: 0.7 mg/dL (ref 0.2–1.2)

## 2017-07-19 LAB — URINALYSIS, ROUTINE W REFLEX MICROSCOPIC
Bilirubin Urine: NEGATIVE
GLUCOSE, UA: NEGATIVE
Hgb urine dipstick: NEGATIVE
KETONES UR: NEGATIVE
Leukocytes, UA: NEGATIVE
NITRITE: NEGATIVE
Protein, ur: NEGATIVE
SPECIFIC GRAVITY, URINE: 1.022 (ref 1.001–1.03)

## 2017-07-19 LAB — MAGNESIUM: Magnesium: 2 mg/dL (ref 1.5–2.5)

## 2017-07-19 LAB — MICROALBUMIN / CREATININE URINE RATIO
Creatinine, Urine: 117 mg/dL (ref 20–320)
MICROALB UR: 0.4 mg/dL
MICROALB/CREAT RATIO: 3 ug/mg{creat} (ref <30)

## 2017-07-19 LAB — INSULIN, RANDOM: Insulin: 8.2 u[IU]/mL (ref 2.0–19.6)

## 2017-07-19 LAB — HEMOGLOBIN A1C
EAG (MMOL/L): 5.8 (calc)
Hgb A1c MFr Bld: 5.3 % of total Hgb (ref <5.7)
Mean Plasma Glucose: 105 (calc)

## 2017-07-19 LAB — TSH: TSH: 2.38 mIU/L (ref 0.40–4.50)

## 2017-07-19 LAB — VITAMIN D 25 HYDROXY (VIT D DEFICIENCY, FRACTURES): Vit D, 25-Hydroxy: 59 ng/mL (ref 30–100)

## 2017-07-19 LAB — PSA: PSA: 0.9 ng/mL (ref <=4.0)

## 2017-09-10 DIAGNOSIS — H2513 Age-related nuclear cataract, bilateral: Secondary | ICD-10-CM | POA: Diagnosis not present

## 2017-09-16 ENCOUNTER — Other Ambulatory Visit: Payer: Self-pay | Admitting: Internal Medicine

## 2017-10-24 ENCOUNTER — Other Ambulatory Visit: Payer: Self-pay | Admitting: Physician Assistant

## 2017-11-06 NOTE — Progress Notes (Signed)
WELCOME TO MEDICARE WELLNESS VISIT AND FOLLOW UP Assessment:   Encounter for Medicare annual wellness exam  Essential hypertension - continue medications, DASH diet, exercise and monitor at home. Call if greater than 130/80.  -     CBC with Differential/Platelet -     CMP/GFR -     TSH  OSA on CPAP Weight loss advised, continue CPAP  Mixed hyperlipidemia -continue medications, check lipids, decrease fatty foods, increase activity.  -     Lipid panel  Prediabetes Discussed general issues about diabetes pathophysiology and management., Educational material distributed., Suggested low cholesterol diet., Encouraged aerobic exercise., Discussed foot care., Reminded to get yearly retinal exam.  Medication management -     Magnesium  Gastroesophageal reflux disease, esophagitis presence not specified Continue PPI/H2 blocker, diet discussed  Vitamin D deficiency Continue supplement  Obesity (BMI 30-34.9) - long discussion about weight loss, diet, and exercise  Chronic right knee pain Not controlled by NSAIDs, had had injections, requesting ortho referral   Over 30 minutes of exam, counseling, chart review, and critical decision making was performed  Future Appointments  Date Time Provider [place]  02/10/2018  9:30 AM [name], MD [facility] None  08/13/2018  2:00 PM [name], MD [facility] None     Plan:   During the course of the visit the patient was educated and counseled about appropriate screening and preventive services including:    Pneumococcal vaccine   Influenza vaccine  Prevnar 13  Td vaccine  Screening electrocardiogram  Colorectal cancer screening  Diabetes screening  Glaucoma screening  Nutrition counseling    Subjective:  Jason Dennis is a 67 y.o. male who presents for Medicare Annual Wellness Visit and 3 month follow up for HTN, hyperlipidemia, prediabetes, and vitamin D Def. He is on CPAP for OSA.   He reports  chronic right knee pain, previously getting injections by ortho, requested referral back.   he is prescribed phentermine for weight loss.  While on the medication they have lost 0 lbs since last visit. They deny palpitations, anxiety, trouble sleeping, elevated BP. He has lost 20 lb previously with medication  BMI is Body mass index is 30.99 kg/m., he is not currently working on diet and exercise. Wt Readings from Last 3 Encounters:  11/08/17 216 lb (98 kg)  07/18/17 216 lb (98 kg)  04/24/17 219 lb 6.4 oz (99.5 kg)    His blood pressure has been controlled at home, today their BP is BP: 140/80   He does workout. He denies chest pain, shortness of breath, dizziness.   He is on cholesterol medication and denies myalgias. His cholesterol is at goal. The cholesterol last visit was:   Lab Results  Component Value Date   CHOL 170 07/18/2017   HDL 59 07/18/2017   LDLCALC 88 07/18/2017   TRIG 124 07/18/2017   CHOLHDL 2.9 07/18/2017   He does have diet controlled prediabetes.  He reports that he is doing well with diet and exercise.   Lab Results  Component Value Date   HGBA1C 5.3 07/18/2017    Last GFR Lab Results  Component Value Date   GFRNONAA 91 07/18/2017    Patient is on Vitamin D supplement.   Lab Results  Component Value Date   VD25OH 59 07/18/2017       Medication Review: Current Outpatient Medications on File Prior to Visit  Medication Sig Dispense Refill  . atorvastatin (LIPITOR) 80 MG tablet TAKE 1 TABLET BY MOUTH ONCE DAILY FOR  CHOLESTEROL 90 tablet 1  . cholecalciferol (VITAMIN D) 1000 UNITS tablet Take 5,000 Units by mouth daily.     . meclizine (ANTIVERT) 25 MG tablet TAKE 1 TABLET BY MOUTH THREE TIMES DAILY AS NEEDED FOR DIZZINESS OR  VERTIGO 90 tablet 0  . Omega-3 Fatty Acids (FISH OIL) 1000 MG CAPS Take by mouth.    Marland Kitchen omeprazole (PRILOSEC) 20 MG capsule Take 20 mg by mouth daily.    . phentermine (ADIPEX-P) 37.5 MG tablet TAKE 1/2 TO 1 (ONE-HALF TO ONE)  TABLET BY MOUTH ONCE DAILY IN THE MORNING FOR WEIGHT LOSS 30 tablet 2  . terbinafine (LAMISIL) 250 MG tablet Take 1 tablet daily for skin fungus 30 tablet 2   No current facility-administered medications on file prior to visit.     Allergies: No Known Allergies  Current Problems (verified) has Hyperlipidemia, mixed; Hypertension; Prediabetes; Vitamin D deficiency; Obesity (BMI 30.0-34.9); OSA on CPAP; GERD (gastroesophageal reflux disease); and Medication management on their problem list.  Screening Tests Immunization History  Administered Date(s) Administered  . Influenza, High Dose Seasonal PF 10/21/2015, 12/24/2016  . PPD Test 11/10/2013, 11/11/2014  . Pneumococcal Conjugate-13 10/21/2015  . Pneumococcal Polysaccharide-23 05/31/2016  . Pneumococcal-Unspecified 04/15/2009  . Td 11/17/2004  . Tdap 11/11/2014  . Zoster 10/28/2012    Preventative care: Last colonoscopy: 04/2016 due 2023  Prior vaccinations: TD or Tdap: 2016  Influenza: 2018  Pneumococcal: 2018 Prevnar13: 2017 Shingles/Zostavax: 2014  Names of Other Physician/Practitioners you currently use: 1. [facility] here for primary care 2. Dr. Delman Cheadle, eye doctor, last visit 2019 3. Dr. Aggie Moats, dentist, last visit 2019, q73m  Patient Care Team: [name], MD as PCP - General  Surgical: He  has a past surgical history that includes Spine surgery. Family His family history includes COPD in his mother; Colon cancer in his father; Diabetes in his brother; Heart disease in his mother; Heart failure in his father and mother; Hypertension in his mother. Social history  He reports that he quit smoking about 27 years ago. His smoking use included cigarettes. He has a 32.00 pack-year smoking history. He has never used smokeless tobacco. He reports that he drinks about 22.0 standard drinks of alcohol per week. He reports that he does not use drugs.  MEDICARE WELLNESS  OBJECTIVES: Physical activity: Current Exercise Habits: Home exercise routine, Type of exercise: walking, Time (Minutes): 20, Frequency (Times/Week): 1, Weekly Exercise (Minutes/Week): 20, Intensity: Mild, Exercise limited by: None identified Cardiac risk factors: Cardiac Risk Factors include: advanced age (>42men, >5 women);male gender;hypertension;dyslipidemia;sedentary lifestyle;obesity (BMI >30kg/m2) Depression/mood screen:   Depression screen [facility] 2/9 11/08/2017  Decreased Interest 0  Down, Depressed, Hopeless 0  PHQ - 2 Score 0    ADLs:  In your present state of health, do you have any difficulty performing the following activities: 11/08/2017 12/24/2016  Hearing? N N  Vision? N N  Difficulty concentrating or making decisions? N N  Walking or climbing stairs? N N  Dressing or bathing? N N  Doing errands, shopping? N N  Some recent data might be hidden    Cognitive Testing  Alert? Yes  Normal Appearance?Yes  Oriented to person? Yes  Place? Yes   Time? Yes  Recall of three objects?  Yes  Can perform simple calculations? Yes  Displays appropriate judgment?Yes  Can read the correct time from a watch face?Yes  EOL planning: Does Patient Have a Medical Advance Directive?: Yes Type of Advance Directive: Healthcare Power of [place], Living  will Does patient want to make changes to medical advance directive?: No - Patient declined Copy of [place] in Chart?: No - copy requested   Objective:   Today's Vitals   11/08/17 0841  BP: 140/80  Pulse: 80  Temp: (!) 97.5 F (36.4 C)  SpO2: 99%  Weight: 216 lb (98 kg)  Height: 5\' 10"  (1.778 m)  PainSc: 10-Worst pain ever  PainLoc: Knee   Body mass index is 30.99 kg/m.  General appearance: alert, no distress, WD/WN, male HEENT: normocephalic, sclerae anicteric, TMs pearly, nares patent, no discharge or erythema, pharynx normal Oral cavity: MMM, no lesions Neck: supple, no lymphadenopathy, no thyromegaly, no  masses Heart: RRR, normal S1, S2, no murmurs Lungs: CTA bilaterally, no wheezes, rhonchi, or rales Abdomen: +bs, soft, non tender, non distended, no masses, no hepatomegaly, no splenomegaly Musculoskeletal: nontender, no swelling, no obvious deformity Extremities: no edema, no cyanosis, no clubbing Pulses: 2+ symmetric, upper and lower extremities, normal cap refill Neurological: alert, oriented x 3, CN2-12 intact, strength normal upper extremities and lower extremities, sensation normal throughout, DTRs 2+ throughout, no cerebellar signs, gait normal Psychiatric: normal affect, behavior normal, pleasant   Medicare Attestation I have personally reviewed: The patient's medical and social history Their use of alcohol, tobacco or illicit drugs Their current medications and supplements The patient's functional ability including ADLs,fall risks, home safety risks, cognitive, and hearing and visual impairment Diet and physical activities Evidence for depression or mood disorders  The patient's weight, height, BMI, and visual acuity have been recorded in the chart.  I have made referrals, counseling, and provided education to the patient based on review of the above and I have provided the patient with a written personalized care plan for preventive services.     Izora Ribas, NP   11/08/2017

## 2017-11-08 ENCOUNTER — Ambulatory Visit (INDEPENDENT_AMBULATORY_CARE_PROVIDER_SITE_OTHER): Payer: Medicare HMO | Admitting: Adult Health

## 2017-11-08 ENCOUNTER — Encounter: Payer: Self-pay | Admitting: Adult Health

## 2017-11-08 VITALS — BP 140/80 | HR 80 | Temp 97.5°F | Ht 70.0 in | Wt 216.0 lb

## 2017-11-08 DIAGNOSIS — G8929 Other chronic pain: Secondary | ICD-10-CM

## 2017-11-08 DIAGNOSIS — K219 Gastro-esophageal reflux disease without esophagitis: Secondary | ICD-10-CM

## 2017-11-08 DIAGNOSIS — E782 Mixed hyperlipidemia: Secondary | ICD-10-CM

## 2017-11-08 DIAGNOSIS — E559 Vitamin D deficiency, unspecified: Secondary | ICD-10-CM

## 2017-11-08 DIAGNOSIS — M25561 Pain in right knee: Secondary | ICD-10-CM | POA: Diagnosis not present

## 2017-11-08 DIAGNOSIS — R7303 Prediabetes: Secondary | ICD-10-CM | POA: Diagnosis not present

## 2017-11-08 DIAGNOSIS — G4733 Obstructive sleep apnea (adult) (pediatric): Secondary | ICD-10-CM

## 2017-11-08 DIAGNOSIS — I10 Essential (primary) hypertension: Secondary | ICD-10-CM

## 2017-11-08 DIAGNOSIS — R6889 Other general symptoms and signs: Secondary | ICD-10-CM | POA: Diagnosis not present

## 2017-11-08 DIAGNOSIS — Z79899 Other long term (current) drug therapy: Secondary | ICD-10-CM | POA: Diagnosis not present

## 2017-11-08 DIAGNOSIS — E669 Obesity, unspecified: Secondary | ICD-10-CM | POA: Diagnosis not present

## 2017-11-08 DIAGNOSIS — Z0001 Encounter for general adult medical examination with abnormal findings: Secondary | ICD-10-CM | POA: Diagnosis not present

## 2017-11-08 DIAGNOSIS — Z9989 Dependence on other enabling machines and devices: Secondary | ICD-10-CM

## 2017-11-08 DIAGNOSIS — E66811 Obesity, class 1: Secondary | ICD-10-CM

## 2017-11-08 DIAGNOSIS — Z0000 Encounter for general adult medical examination without abnormal findings: Secondary | ICD-10-CM

## 2017-11-08 NOTE — Patient Instructions (Addendum)
Please bring a copy of Advanced directives and living will for our records.    Jason Dennis , Thank you for taking time to come for your Medicare Wellness Visit. I appreciate your ongoing commitment to your health goals. Please review the following plan we discussed and let me know if I can assist you in the future.   These are the goals we discussed: Goals    . Blood Pressure < 130/80    . Exercise 150 min/wk Moderate Activity    . LDL CALC < 100    . Weight (lb) < 200 lb (90.7 kg)       This is a list of the screening recommended for you and due dates:  Health Maintenance  Topic Date Due  . Flu Shot  12/03/2017*  . Colon Cancer Screening  05/02/2021  . Tetanus Vaccine  11/10/2024  .  Hepatitis C: One time screening is recommended by Center for Disease Control  (CDC) for  adults born from [birth year] through 1965.   Completed  . Pneumonia vaccines  Completed  *Topic was postponed. The date shown is not the original due date.     Know what a healthy weight is for you (roughly BMI <25) and aim to maintain this  Aim for 5-10+ servings of fruits and vegetables daily  65-80+ fluid ounces of water or unsweet tea for healthy kidneys  Limit to max 1-2 drink of alcohol per day; avoid smoking/tobacco  Limit animal fats in diet for cholesterol and heart health - choose grass fed whenever available  Avoid highly processed foods, and foods high in saturated/trans fats  Aim for low stress - take time to unwind and care for your mental health  Aim for 150 min of moderate intensity exercise weekly for heart health, and weights twice weekly for bone health  Aim for 7-9 hours of sleep daily      When it comes to diets, agreement about the perfect plan isn't easy to find, even among the experts. Experts at the [employer] developed an idea known as the Healthy Eating Plate. Just imagine a plate divided into logical, healthy portions.  The emphasis is on diet  quality:  Load up on vegetables and fruits - one-half of your plate: Aim for color and variety, and remember that potatoes don't count.  Go for whole grains - one-quarter of your plate: Whole wheat, barley, wheat berries, quinoa, oats, brown rice, and foods made with them. If you want pasta, go with whole wheat pasta.  Protein power - one-quarter of your plate: Fish, chicken, beans, and nuts are all healthy, versatile protein sources. Limit red meat.  The diet, however, does go beyond the plate, offering a few other suggestions.  Use healthy plant oils, such as olive, canola, soy, corn, sunflower and peanut. Check the labels, and avoid partially hydrogenated oil, which have unhealthy trans fats.  If you're thirsty, drink water. Coffee and tea are good in moderation, but skip sugary drinks and limit milk and dairy products to one or two daily servings.  The type of carbohydrate in the diet is more important than the amount. Some sources of carbohydrates, such as vegetables, fruits, whole grains, and beans-are healthier than others.  Finally, stay active.

## 2017-11-09 LAB — HEMOGLOBIN A1C
EAG (MMOL/L): 5.7 (calc)
Hgb A1c MFr Bld: 5.2 % of total Hgb (ref <5.7)
MEAN PLASMA GLUCOSE: 103 (calc)

## 2017-11-09 LAB — COMPLETE METABOLIC PANEL WITH GFR
AG Ratio: 2.3 (calc) (ref 1.0–2.5)
ALKALINE PHOSPHATASE (APISO): 101 U/L (ref 40–115)
ALT: 30 U/L (ref 9–46)
AST: 23 U/L (ref 10–35)
Albumin: 4.5 g/dL (ref 3.6–5.1)
BUN: 15 mg/dL (ref 7–25)
CALCIUM: 9.9 mg/dL (ref 8.6–10.3)
CO2: 26 mmol/L (ref 20–32)
CREATININE: 0.93 mg/dL (ref 0.70–1.25)
Chloride: 105 mmol/L (ref 98–110)
GFR, EST NON AFRICAN AMERICAN: 85 mL/min/{1.73_m2} (ref >=60)
GFR, Est African American: 98 mL/min/{1.73_m2} (ref >=60)
Globulin: 2 g/dL (calc) (ref 1.9–3.7)
Glucose, Bld: 134 mg/dL — ABNORMAL HIGH (ref 65–99)
Potassium: 4.1 mmol/L (ref 3.5–5.3)
Sodium: 141 mmol/L (ref 135–146)
Total Bilirubin: 0.6 mg/dL (ref 0.2–1.2)
Total Protein: 6.5 g/dL (ref 6.1–8.1)

## 2017-11-09 LAB — CBC WITH DIFFERENTIAL/PLATELET
BASOS ABS: 39 {cells}/uL (ref 0–200)
BASOS PCT: 0.9 %
Eosinophils Absolute: 232 cells/uL (ref 15–500)
Eosinophils Relative: 5.4 %
HCT: 41.8 % (ref 38.5–50.0)
HEMOGLOBIN: 14.1 g/dL (ref 13.2–17.1)
Lymphs Abs: 761 cells/uL — ABNORMAL LOW (ref 850–3900)
MCH: 30.7 pg (ref 27.0–33.0)
MCHC: 33.7 g/dL (ref 32.0–36.0)
MCV: 90.9 fL (ref 80.0–100.0)
MPV: 10.1 fL (ref 7.5–12.5)
Monocytes Relative: 8.4 %
NEUTROS ABS: 2907 {cells}/uL (ref 1500–7800)
Neutrophils Relative %: 67.6 %
Platelets: 231 10*3/uL (ref 140–400)
RBC: 4.6 10*6/uL (ref 4.20–5.80)
RDW: 12 % (ref 11.0–15.0)
TOTAL LYMPHOCYTE: 17.7 %
WBC: 4.3 10*3/uL (ref 3.8–10.8)
WBCMIX: 361 {cells}/uL (ref 200–950)

## 2017-11-09 LAB — LIPID PANEL
CHOL/HDL RATIO: 2.6 (calc) (ref <5.0)
CHOLESTEROL: 140 mg/dL (ref <200)
HDL: 53 mg/dL (ref >40)
LDL Cholesterol (Calc): 68 mg/dL (calc)
NON-HDL CHOLESTEROL (CALC): 87 mg/dL (ref <130)
TRIGLYCERIDES: 100 mg/dL (ref <150)

## 2017-11-09 LAB — MAGNESIUM: Magnesium: 1.7 mg/dL (ref 1.5–2.5)

## 2017-11-09 LAB — TSH: TSH: 2.04 mIU/L (ref 0.40–4.50)

## 2017-11-19 ENCOUNTER — Ambulatory Visit (INDEPENDENT_AMBULATORY_CARE_PROVIDER_SITE_OTHER): Payer: Medicare HMO

## 2017-11-19 ENCOUNTER — Ambulatory Visit (INDEPENDENT_AMBULATORY_CARE_PROVIDER_SITE_OTHER): Payer: Medicare HMO | Admitting: Orthopaedic Surgery

## 2017-11-19 DIAGNOSIS — M1711 Unilateral primary osteoarthritis, right knee: Secondary | ICD-10-CM

## 2017-11-19 DIAGNOSIS — M25512 Pain in left shoulder: Secondary | ICD-10-CM | POA: Diagnosis not present

## 2017-11-19 DIAGNOSIS — G8929 Other chronic pain: Secondary | ICD-10-CM | POA: Diagnosis not present

## 2017-11-19 DIAGNOSIS — R69 Illness, unspecified: Secondary | ICD-10-CM | POA: Diagnosis not present

## 2017-11-19 MED ORDER — LIDOCAINE HCL 1 % IJ SOLN
2.0000 mL | INTRAMUSCULAR | Status: AC | PRN
  Administered 2017-11-19: 2 mL

## 2017-11-19 MED ORDER — METHYLPREDNISOLONE ACETATE 40 MG/ML IJ SUSP
40.0000 mg | INTRAMUSCULAR | Status: AC | PRN
  Administered 2017-11-19: 40 mg via INTRA_ARTICULAR

## 2017-11-19 MED ORDER — BUPIVACAINE HCL 0.5 % IJ SOLN
2.0000 mL | INTRAMUSCULAR | Status: AC | PRN
  Administered 2017-11-19: 2 mL via INTRA_ARTICULAR

## 2017-11-19 NOTE — Progress Notes (Signed)
Office Visit Note   Patient: Jason Dennis           Date of Birth: 08/17/1950           MRN: [id number] Visit Date: 11/19/2017              Requested by: Liane Comber, NP [address] [place], [place] [address] PCP: [name], MD   Assessment & Plan: Visit Diagnoses:  1. Unilateral primary osteoarthritis, right knee   2. Chronic left shoulder pain     Plan: Overall I think Jason Dennis has advanced medial compartment degenerative joint disease.  Cortisone injection performed today.  Hopefully this will give him some good relief.  We did briefly discuss total knee replacement today.  In terms of his left shoulder I think his issue is his biceps tendon.  Since this has been going on for years and he has seen his primary care doctor numerous times its time to obtain an MRI so that we can better evaluate for structural abnormalities.  We will see him back after the MRI.  Follow-Up Instructions: Return in about 10 days (around 11/29/2017).   Orders:  Orders Placed This Encounter  Procedures  . XR Knee Complete 4 Views Right  . XR Shoulder Left  . MR Shoulder Left w/o contrast   No orders of the defined types were placed in this encounter.     Procedures: Large Joint Inj: R knee on 11/19/2017 8:52 AM Indications: pain Details: 22 G needle  Arthrogram: No  Medications: 40 mg methylPREDNISolone acetate 40 MG/ML; 2 mL lidocaine 1 %; 2 mL bupivacaine 0.5 % Consent was given by the patient. Patient was prepped and draped in the usual sterile fashion.       Clinical Data: No additional findings.   Subjective: Chief Complaint  Patient presents with  . Right Knee - Pain  . Left Shoulder - Pain    Jason Dennis is a very pleasant 67 year old gentleman who comes in with chronic right knee pain and chronic left shoulder pain.  Denies any injuries.  In terms of the right knee the pain is excruciating at night.  It throbs and aches severely.  He does endorse cracking  and burning pain.  Denies any swelling or mechanical symptoms.  He has taken Aleve without significant relief.  He has not had any cortisone injections.  His left shoulder has been hurting for years along the bicipital groove.  He denies any injuries.  He denies any radicular symptoms.  Aleve does not help either.  Denies any numbness and tingling.   Review of Systems  Constitutional: Negative.   All other systems reviewed and are negative.    Objective: Vital Signs: There were no vitals taken for this visit.  Physical Exam  Constitutional: He is oriented to person, place, and time. He appears well-developed and well-nourished.  HENT:  Head: Normocephalic and atraumatic.  Eyes: Pupils are equal, round, and reactive to light.  Neck: Neck supple.  Pulmonary/Chest: Effort normal.  Abdominal: Soft.  Musculoskeletal: Normal range of motion.  Neurological: He is alert and oriented to person, place, and time.  Skin: Skin is warm.  Psychiatric: He has a normal mood and affect. His behavior is normal. Judgment and thought content normal.  Nursing note and vitals reviewed.   Ortho Exam Right knee exam shows a trace joint effusion.  Cause and cruciates are stable.  No significant patellofemoral crepitus.  Left shoulder exam shows negative impingement negative rotator cuff dysfunction.  Positive speeds sign.  Negative crank.  Negative O'Brien. Specialty Comments:  No specialty comments available.  Imaging: Xr Knee Complete 4 Views Right  Result Date: 11/19/2017 Bone-on-bone joint space narrowing of the medial compartment.  Mild degenerative joint disease elsewhere in the knee.  Xr Shoulder Left  Result Date: 11/19/2017 No acute or structural abnormalities.  Bulky distal clavicle changes    PMFS History: Patient Active Problem List   Diagnosis Date Noted  . GERD (gastroesophageal reflux disease) 11/11/2014  . Medication management 11/11/2014  . OSA on CPAP 06/16/2014  .  Hyperlipidemia, mixed   . Hypertension   . Prediabetes   . Vitamin D deficiency   . Obesity (BMI 30.0-34.9)    Past Medical History:  Diagnosis Date  . GERD (gastroesophageal reflux disease)   . Hyperlipidemia   . Obesity   . Prediabetes   . Sleep apnea    CPAP  . Vitamin D deficiency     Family History  Problem Relation Age of Onset  . Hypertension Mother   . Heart disease Mother   . COPD Mother   . Heart failure Mother   . Heart failure Father   . Colon cancer Father        died at 31;pt unsure age,never knew father  . Diabetes Brother     Past Surgical History:  Procedure Laterality Date  . SPINE SURGERY     [phone]   Social History   Occupational History  . Not on file  Tobacco Use  . Smoking status: Former Smoker    Packs/day: 1.00    Years: 32.00    Pack years: 32.00    Types: Cigarettes    Last attempt to quit: 02/03/1990    Years since quitting: 27.8  . Smokeless tobacco: Never Used  Substance and Sexual Activity  . Alcohol use: Yes    Alcohol/week: 22.0 standard drinks    Types: 14 Standard drinks or equivalent, 7 Cans of beer, 1 Shots of liquor per week    Comment: 7 beers a week plus 1 mixed drink daily.  . Drug use: No  . Sexual activity: Not Currently

## 2017-12-02 ENCOUNTER — Ambulatory Visit
Admission: RE | Admit: 2017-12-02 | Discharge: 2017-12-02 | Disposition: A | Discharge status: Home / Self Care | Payer: Medicare HMO | Source: Ambulatory Visit | Attending: Orthopaedic Surgery | Admitting: Orthopaedic Surgery

## 2017-12-02 ENCOUNTER — Other Ambulatory Visit: Payer: Self-pay | Admitting: Adult Health

## 2017-12-02 DIAGNOSIS — M75112 Incomplete rotator cuff tear or rupture of left shoulder, not specified as traumatic: Secondary | ICD-10-CM | POA: Diagnosis not present

## 2017-12-02 DIAGNOSIS — G8929 Other chronic pain: Secondary | ICD-10-CM

## 2017-12-02 DIAGNOSIS — M25512 Pain in left shoulder: Principal | ICD-10-CM

## 2017-12-05 ENCOUNTER — Ambulatory Visit (INDEPENDENT_AMBULATORY_CARE_PROVIDER_SITE_OTHER): Payer: Medicare HMO | Admitting: Orthopaedic Surgery

## 2017-12-05 ENCOUNTER — Encounter (INDEPENDENT_AMBULATORY_CARE_PROVIDER_SITE_OTHER): Payer: Self-pay | Admitting: Orthopaedic Surgery

## 2017-12-05 DIAGNOSIS — M7542 Impingement syndrome of left shoulder: Secondary | ICD-10-CM | POA: Diagnosis not present

## 2017-12-05 DIAGNOSIS — M75102 Unspecified rotator cuff tear or rupture of left shoulder, not specified as traumatic: Secondary | ICD-10-CM

## 2017-12-05 DIAGNOSIS — M67922 Unspecified disorder of synovium and tendon, left upper arm: Secondary | ICD-10-CM | POA: Diagnosis not present

## 2017-12-05 DIAGNOSIS — M75100 Unspecified rotator cuff tear or rupture of unspecified shoulder, not specified as traumatic: Secondary | ICD-10-CM | POA: Diagnosis not present

## 2017-12-05 NOTE — Progress Notes (Signed)
Office Visit Note   Patient: Jason Dennis           Date of Birth: January 04, 1951           MRN: [id number] Visit Date: 12/05/2017              Requested by: [name], [place] [place], [place] [address] PCP: [name], MD   Assessment & Plan: Visit Diagnoses:  1. Tear of left supraspinatus tendon   2. Impingement syndrome of left shoulder   3. Tendinopathy of left biceps     Plan: MRI findings of the left shoulder are consistent with a full-thickness supraspinatus tear with 9 mm of retraction.  There is a partial-thickness tear of the subscapularis tendon which is also exhibiting significant tendinosis.  He has severe tendinosis of the intra-articular portion of the biceps.  His AC joint is significantly degenerative and symptomatic.  At this point my recommendation is for arthroscopic treatment of the MRI findings given failure of conservative treatment.  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  Risks and benefits and rehab recovery were discussed.  Patient is compliant with CPAP for his OSA. Total face to face encounter time was greater than 25 minutes and over half of this time was spent in counseling and/or coordination of care.  Follow-Up Instructions: Return for 1 week postop visit.   Orders:  No orders of the defined types were placed in this encounter.  No orders of the defined types were placed in this encounter.     Procedures: No procedures performed   Clinical Data: No additional findings.   Subjective: Chief Complaint  Patient presents with  . Left Shoulder - Pain, Follow-up    Bili today for review of his left shoulder MRI.  His shoulder still still continues to bother him and limits him with ADLs.  He denies any radicular symptoms.   Review of Systems  Constitutional: Negative.   All other systems reviewed and are negative.    Objective: Vital Signs:  There were no vitals taken for this visit.  Physical Exam  Constitutional: He is oriented to person, place, and time. He appears well-developed and well-nourished.  Pulmonary/Chest: Effort normal.  Abdominal: Soft.  Neurological: He is alert and oriented to person, place, and time.  Skin: Skin is warm.  Psychiatric: He has a normal mood and affect. His behavior is normal. Judgment and thought content normal.  Nursing note and vitals reviewed.   Ortho Exam Left shoulder exam shows positive cross adduction.  Positive impingement.  Positive empty can.  Mildly positive belly press.  Positive O'Brien and positive Speed test. Specialty Comments:  No specialty comments available.  Imaging: No results found.   PMFS History: Patient Active Problem List   Diagnosis Date Noted  . Tear of left supraspinatus tendon 12/05/2017  . Impingement syndrome of left shoulder 12/05/2017  . Tendinopathy of left biceps 12/05/2017  . GERD (gastroesophageal reflux disease) 11/11/2014  . Medication management 11/11/2014  . OSA on CPAP 06/16/2014  . Hyperlipidemia, mixed   . Hypertension   . Prediabetes   . Vitamin D deficiency   . Obesity (BMI 30.0-34.9)    Past Medical History:  Diagnosis Date  . GERD (gastroesophageal reflux disease)   . Hyperlipidemia   . Obesity   . Prediabetes   . Sleep apnea    CPAP  . Vitamin D deficiency     Family  History  Problem Relation Age of Onset  . Hypertension Mother   . Heart disease Mother   . COPD Mother   . Heart failure Mother   . Heart failure Father   . Colon cancer Father        died at 49;pt unsure age,never knew father  . Diabetes Brother     Past Surgical History:  Procedure Laterality Date  . SPINE SURGERY     [phone]   Social History   Occupational History  . Not on file  Tobacco Use  . Smoking status: Former Smoker    Packs/day: 1.00    Years: 32.00    Pack years: 32.00    Types: Cigarettes    Last attempt to quit:  02/03/1990    Years since quitting: 27.8  . Smokeless tobacco: Never Used  Substance and Sexual Activity  . Alcohol use: Yes    Alcohol/week: 22.0 standard drinks    Types: 14 Standard drinks or equivalent, 7 Cans of beer, 1 Shots of liquor per week    Comment: 7 beers a week plus 1 mixed drink daily.  . Drug use: No  . Sexual activity: Not Currently

## 2018-01-08 DIAGNOSIS — R69 Illness, unspecified: Secondary | ICD-10-CM | POA: Diagnosis not present

## 2018-01-10 ENCOUNTER — Ambulatory Visit: Payer: Medicare HMO

## 2018-01-10 VITALS — Temp 97.3°F

## 2018-01-10 DIAGNOSIS — Z23 Encounter for immunization: Secondary | ICD-10-CM

## 2018-01-16 ENCOUNTER — Ambulatory Visit (INDEPENDENT_AMBULATORY_CARE_PROVIDER_SITE_OTHER): Payer: Medicare HMO | Admitting: Orthopaedic Surgery

## 2018-01-28 ENCOUNTER — Other Ambulatory Visit: Payer: Self-pay | Admitting: Physician Assistant

## 2018-02-09 ENCOUNTER — Encounter: Payer: Self-pay | Admitting: Internal Medicine

## 2018-02-09 DIAGNOSIS — Z8249 Family history of ischemic heart disease and other diseases of the circulatory system: Secondary | ICD-10-CM | POA: Insufficient documentation

## 2018-02-09 DIAGNOSIS — Z87891 Personal history of nicotine dependence: Secondary | ICD-10-CM | POA: Insufficient documentation

## 2018-02-09 NOTE — Progress Notes (Signed)
This very nice 67 y.o. MWM presents for 3 month follow up with HTN, HLD, Pre-Diabetes and Vitamin D Deficiency.  Patient is on CPAP for OSA with improved restorative sleep. His GERD is controlled with his meds.      Patient is treated for HTN (2016)  & BP has been controlled at home. Today's BP is at goal - 132/84. Patient has had no complaints of any cardiac type chest pain, palpitations, dyspnea / orthopnea / PND, dizziness, claudication, or dependent edema.     Hyperlipidemia is controlled with diet & meds. Patient denies myalgias or other med SE's. Last Lipids were at goal: Lab Results  Component Value Date   CHOL 140 11/08/2017   HDL 53 11/08/2017   LDLCALC 68 11/08/2017   TRIG 100 11/08/2017   CHOLHDL 2.6 11/08/2017      Also, the patient has history of PreDiabetes (A1c 5.9% / 2011)  and has had no symptoms of reactive hypoglycemia, diabetic polys, paresthesias or visual blurring.  Last A1c was Normal & at goal: Lab Results  Component Value Date   HGBA1C 5.2 11/08/2017      Further, the patient also has history of Vitamin D Deficiency ("31" / 2008) and supplements vitamin D without any suspected side-effects. Last vitamin D was  At goal: Lab Results  Component Value Date   VD25OH 59 07/18/2017   Current Outpatient Medications on File Prior to Visit  Medication Sig  . atorvastatin  80 MG tablet TAKE 1 TABLET  ONCE DAILY   . VITAMIN D  5,000 Units Take daily.   . meclizine25 MG tablet TAKE 1 TABLET  3 x / DAILY AS NEEDED   . Omega-3 FISH OIL 1000 MG  Take by mouth.  Marland Kitchen omeprazole  20 MG capsule Take 20 mg by mouth daily.  . phentermine  37.5 MG tablet TAKE 1/2 TO 1 TABLET  DAILY    No Known Allergies   PMHx:   Past Medical History:  Diagnosis Date  . GERD (gastroesophageal reflux disease)   . Hyperlipidemia   . Obesity   . Prediabetes   . Sleep apnea    CPAP  . Vitamin D deficiency    Immunization History  Administered Date(s) Administered  . Influenza, High  Dose Seasonal PF 10/21/2015, 12/24/2016, 01/10/2018  . PPD Test 11/10/2013, 11/11/2014  . Pneumococcal Conjugate-13 10/21/2015  . Pneumococcal Polysaccharide-23 05/31/2016  . Pneumococcal-Unspecified 04/15/2009  . Td 11/17/2004  . Tdap 11/11/2014  . Zoster 10/28/2012   Past Surgical History:  Procedure Laterality Date  . SPINE SURGERY     [phone]   FHx:    Reviewed / unchanged  SHx:    Reviewed / unchanged   Systems Review:  Constitutional: Denies fever, chills, wt changes, headaches, insomnia, fatigue, night sweats, change in appetite. Eyes: Denies redness, blurred vision, diplopia, discharge, itchy, watery eyes.  ENT: Denies discharge, congestion, post nasal drip, epistaxis, sore throat, earache, hearing loss, dental pain, tinnitus, vertigo, sinus pain, snoring.  CV: Denies chest pain, palpitations, irregular heartbeat, syncope, dyspnea, diaphoresis, orthopnea, PND, claudication or edema. Respiratory: denies cough, dyspnea, DOE, pleurisy, hoarseness, laryngitis, wheezing.  Gastrointestinal: Denies dysphagia, odynophagia, heartburn, reflux, water brash, abdominal pain or cramps, nausea, vomiting, bloating, diarrhea, constipation, hematemesis, melena, hematochezia  or hemorrhoids. Genitourinary: Denies dysuria, frequency, urgency, nocturia, hesitancy, discharge, hematuria or flank pain. Musculoskeletal: Denies arthralgias, myalgias, stiffness, jt. swelling, pain, limping or strain/sprain.  Skin: Denies pruritus, rash, hives, warts, acne, eczema or change in skin  lesion(s). Neuro: No weakness, tremor, incoordination, spasms, paresthesia or pain. Psychiatric: Denies confusion, memory loss or sensory loss. Endo: Denies change in weight, skin or hair change.  Heme/Lymph: No excessive bleeding, bruising or enlarged lymph nodes.  Physical Exam  BP 132/84   Pulse 80   Temp (!) 97.5 F (36.4 C)   Resp 16   Ht 5\' 10"  (1.778 m)   Wt 220 lb 9.6 oz (100.1 kg)   BMI 31.65 kg/m    Appears  well nourished, well groomed  and in no distress.  Eyes: PERRLA, EOMs, conjunctiva no swelling or erythema. Sinuses: No frontal/maxillary tenderness ENT/Mouth: EAC's clear, TM's nl w/o erythema, bulging. Nares clear w/o erythema, swelling, exudates. Oropharynx clear without erythema or exudates. Oral hygiene is good. Tongue normal, non obstructing. Hearing intact.  Neck: Supple. Thyroid not palpable. Car 2+/2+ without bruits, nodes or JVD. Chest: Respirations nl with BS clear & equal w/o rales, rhonchi, wheezing or stridor.  Cor: Heart sounds normal w/ regular rate and rhythm without sig. murmurs, gallops, clicks or rubs. Peripheral pulses normal and equal  without edema.  Abdomen: Soft & bowel sounds normal. Non-tender w/o guarding, rebound, hernias, masses or organomegaly.  Lymphatics: Unremarkable.  Musculoskeletal: Full ROM all peripheral extremities, joint stability, 5/5 strength and normal gait.  Skin: Warm, dry without exposed rashes, lesions or ecchymosis apparent.  Neuro: Cranial nerves intact, reflexes equal bilaterally. Sensory-motor testing grossly intact. Tendon reflexes grossly intact.  Pysch: Alert & oriented x 3.  Insight and judgement nl & appropriate. No ideations.  Assessment and Plan:  1. Essential hypertension  - Continue medication, monitor blood pressure at home.  - Continue DASH diet.  Reminder to go to the ER if any CP,  SOB, nausea, dizziness, severe HA, changes vision/speech.  - CBC with Differential/Platelet - COMPLETE METABOLIC PANEL WITH GFR - Magnesium - TSH  2. Hyperlipidemia, mixed  - Continue diet/meds, exercise,& lifestyle modifications.  - Continue monitor periodic cholesterol/liver & renal functions   - Lipid panel - TSH  3. Abnormal glucose  - Continue diet, exercise,  - lifestyle modifications.  - Monitor appropriate labs.  - Hemoglobin A1c - Insulin, random  4. Vitamin D deficiency  - Continue supplementation.  -  VITAMIN D 25 Hydroxyl  5. Prediabetes  - Hemoglobin A1c - Insulin, random  6. Gastroesophageal reflux disease  - CBC with Differential/Platelet  7. OSA on CPAP   8. Medication management  - CBC with Differential/Platelet - COMPLETE METABOLIC PANEL WITH GFR - Magnesium - Lipid panel - TSH - Hemoglobin A1c - Insulin, random - VITAMIN D 25 Hydroxy      Discussed  regular exercise, BP monitoring, weight control to achieve/maintain BMI less than 25 and discussed med and SE's. Recommended labs to assess and monitor clinical status with further disposition pending results of labs. Over 30 minutes of exam, counseling, chart review was performed.

## 2018-02-09 NOTE — Patient Instructions (Signed)

## 2018-02-10 ENCOUNTER — Ambulatory Visit: Payer: Self-pay | Admitting: Physician Assistant

## 2018-02-10 ENCOUNTER — Ambulatory Visit (INDEPENDENT_AMBULATORY_CARE_PROVIDER_SITE_OTHER): Payer: Medicare HMO | Admitting: Internal Medicine

## 2018-02-10 ENCOUNTER — Encounter: Payer: Self-pay | Admitting: Internal Medicine

## 2018-02-10 VITALS — BP 132/84 | HR 80 | Temp 97.5°F | Resp 16 | Ht 70.0 in | Wt 220.6 lb

## 2018-02-10 DIAGNOSIS — Z9989 Dependence on other enabling machines and devices: Secondary | ICD-10-CM

## 2018-02-10 DIAGNOSIS — R7303 Prediabetes: Secondary | ICD-10-CM

## 2018-02-10 DIAGNOSIS — G4733 Obstructive sleep apnea (adult) (pediatric): Secondary | ICD-10-CM

## 2018-02-10 DIAGNOSIS — I10 Essential (primary) hypertension: Secondary | ICD-10-CM | POA: Diagnosis not present

## 2018-02-10 DIAGNOSIS — E782 Mixed hyperlipidemia: Secondary | ICD-10-CM | POA: Diagnosis not present

## 2018-02-10 DIAGNOSIS — E559 Vitamin D deficiency, unspecified: Secondary | ICD-10-CM

## 2018-02-10 DIAGNOSIS — K219 Gastro-esophageal reflux disease without esophagitis: Secondary | ICD-10-CM | POA: Diagnosis not present

## 2018-02-10 DIAGNOSIS — Z79899 Other long term (current) drug therapy: Secondary | ICD-10-CM

## 2018-02-10 DIAGNOSIS — R7309 Other abnormal glucose: Secondary | ICD-10-CM | POA: Diagnosis not present

## 2018-02-11 LAB — CBC WITH DIFFERENTIAL/PLATELET
BASOS PCT: 0.7 %
Basophils Absolute: 38 cells/uL (ref 0–200)
EOS PCT: 4.6 %
Eosinophils Absolute: 248 cells/uL (ref 15–500)
HEMATOCRIT: 44.9 % (ref 38.5–50.0)
Hemoglobin: 14.9 g/dL (ref 13.2–17.1)
Lymphs Abs: 1145 cells/uL (ref 850–3900)
MCH: 30.1 pg (ref 27.0–33.0)
MCHC: 33.2 g/dL (ref 32.0–36.0)
MCV: 90.7 fL (ref 80.0–100.0)
MPV: 10.1 fL (ref 7.5–12.5)
Monocytes Relative: 10 %
NEUTROS PCT: 63.5 %
Neutro Abs: 3429 cells/uL (ref 1500–7800)
PLATELETS: 243 10*3/uL (ref 140–400)
RBC: 4.95 10*6/uL (ref 4.20–5.80)
RDW: 12.3 % (ref 11.0–15.0)
Total Lymphocyte: 21.2 %
WBC mixed population: 540 cells/uL (ref 200–950)
WBC: 5.4 10*3/uL (ref 3.8–10.8)

## 2018-02-11 LAB — COMPLETE METABOLIC PANEL WITH GFR
AG Ratio: 2.5 (calc) (ref 1.0–2.5)
ALT: 33 U/L (ref 9–46)
AST: 19 U/L (ref 10–35)
Albumin: 4.8 g/dL (ref 3.6–5.1)
Alkaline phosphatase (APISO): 86 U/L (ref 40–115)
BUN: 20 mg/dL (ref 7–25)
CO2: 25 mmol/L (ref 20–32)
CREATININE: 0.95 mg/dL (ref 0.70–1.25)
Calcium: 9.8 mg/dL (ref 8.6–10.3)
Chloride: 105 mmol/L (ref 98–110)
GFR, Est African American: 96 mL/min/{1.73_m2} (ref >=60)
GFR, Est Non African American: 82 mL/min/{1.73_m2} (ref >=60)
Globulin: 1.9 g/dL (calc) (ref 1.9–3.7)
Glucose, Bld: 104 mg/dL — ABNORMAL HIGH (ref 65–99)
Potassium: 4.3 mmol/L (ref 3.5–5.3)
Sodium: 138 mmol/L (ref 135–146)
Total Bilirubin: 0.9 mg/dL (ref 0.2–1.2)
Total Protein: 6.7 g/dL (ref 6.1–8.1)

## 2018-02-11 LAB — HEMOGLOBIN A1C
Hgb A1c MFr Bld: 5.4 % of total Hgb (ref <5.7)
MEAN PLASMA GLUCOSE: 108 (calc)
eAG (mmol/L): 6 (calc)

## 2018-02-11 LAB — LIPID PANEL
CHOL/HDL RATIO: 3.4 (calc) (ref <5.0)
CHOLESTEROL: 175 mg/dL (ref <200)
HDL: 51 mg/dL (ref >40)
LDL CHOLESTEROL (CALC): 97 mg/dL
Non-HDL Cholesterol (Calc): 124 mg/dL (calc) (ref <130)
Triglycerides: 177 mg/dL — ABNORMAL HIGH (ref <150)

## 2018-02-11 LAB — TSH: TSH: 2.91 mIU/L (ref 0.40–4.50)

## 2018-02-11 LAB — MAGNESIUM: Magnesium: 1.9 mg/dL (ref 1.5–2.5)

## 2018-02-11 LAB — VITAMIN D 25 HYDROXY (VIT D DEFICIENCY, FRACTURES): Vit D, 25-Hydroxy: 61 ng/mL (ref 30–100)

## 2018-02-11 LAB — INSULIN, RANDOM: INSULIN: 5.1 u[IU]/mL (ref 2.0–19.6)

## 2018-02-17 NOTE — Progress Notes (Deleted)
WELCOME TO MEDICARE WELLNESS VISIT AND FOLLOW UP Assessment:   Essential hypertension - continue medications, DASH diet, exercise and monitor at home. Call if greater than 130/80.  -     CBC with Differential/Platelet -     BASIC METABOLIC PANEL WITH GFR -     Hepatic function panel -     TSH  OSA on CPAP Weight loss advised, continue CPAP  Mixed hyperlipidemia -continue medications, check lipids, decrease fatty foods, increase activity.  -     Lipid panel  Prediabetes Discussed general issues about diabetes pathophysiology and management., Educational material distributed., Suggested low cholesterol diet., Encouraged aerobic exercise., Discussed foot care., Reminded to get yearly retinal exam.  Medication management -     Magnesium  Gastroesophageal reflux disease, esophagitis presence not specified Continue PPI/H2 blocker, diet discussed  Vitamin D deficiency Continue supplement  Encounter for Medicare annual wellness exam 1 year  Needs flu shot -     Flu vaccine HIGH DOSE PF  BMI 30.0-30.9,adult - long discussion about weight loss, diet, and exercise   Over 30 minutes of exam, counseling, chart review, and critical decision making was performed  Future Appointments  Date Time Provider [place]  02/18/2018 11:00 AM Garnet Sierras, NP [facility] None  05/12/2018 10:30 AM Vicie Mutters, PA-C [facility] None  08/13/2018  2:00 PM [name], MD [facility] None     Plan:   During the course of the visit the patient was educated and counseled about appropriate screening and preventive services including:    Pneumococcal vaccine   Influenza vaccine  Prevnar 13  Td vaccine  Screening electrocardiogram  Colorectal cancer screening  Diabetes screening  Glaucoma screening  Nutrition counseling    Subjective:  Jason Dennis is a 67 y.o. male who presents for Medicare Annual Wellness Visit and 3 month follow up for HTN, hyperlipidemia,  prediabetes, and vitamin D Def.   His blood pressure has been controlled at home, today their BP is  , states at home has been a little elevated but changed diet and stopped herb.  He does workout. He denies chest pain, shortness of breath, dizziness.  He is on cholesterol medication and denies myalgias. His cholesterol is at goal. The cholesterol last visit was:   Lab Results  Component Value Date   CHOL 175 02/10/2018   HDL 51 02/10/2018   LDLCALC 97 02/10/2018   TRIG 177 (H) 02/10/2018   CHOLHDL 3.4 02/10/2018   He does have diet controlled prediabetes.  He reports that he is doing well with diet and exercise.   Lab Results  Component Value Date   HGBA1C 5.4 02/10/2018  He reports that he has been taking phentermine and he has been eating less and drinking less.    Last GFR Lab Results  Component Value Date   [facility] 82 02/10/2018    Patient is on Vitamin D supplement.   Lab Results  Component Value Date   VD25OH 61 02/10/2018     BMI is There is no height or weight on file to calculate BMI., he is working on diet and exercise. He has OSA and is on CPAP. Started back on phentermine this AM.  Wt Readings from Last 3 Encounters:  02/10/18 220 lb 9.6 oz (100.1 kg)  11/08/17 216 lb (98 kg)  07/18/17 216 lb (98 kg)    Medication Review: Current Outpatient Medications on File Prior to Visit  Medication Sig Dispense Refill  . atorvastatin (LIPITOR) 80 MG tablet TAKE 1  TABLET BY MOUTH ONCE DAILY FOR CHOLESTEROL 90 tablet 1  . cholecalciferol (VITAMIN D) 1000 UNITS tablet Take 5,000 Units by mouth daily.     . meclizine (ANTIVERT) 25 MG tablet TAKE 1 TABLET BY MOUTH THREE TIMES DAILY AS NEEDED FOR DIZZINESS OR  VERTIGO 90 tablet 0  . Omega-3 Fatty Acids (FISH OIL) 1000 MG CAPS Take by mouth.    Marland Kitchen omeprazole (PRILOSEC) 20 MG capsule Take 20 mg by mouth daily.    . phentermine (ADIPEX-P) 37.5 MG tablet TAKE 1/2 TO 1 (ONE-HALF TO ONE) TABLET BY MOUTH ONCE DAILY IN THE MORNING FOR  WEIGHT LOSS 30 tablet 2   No current facility-administered medications on file prior to visit.     Allergies: No Active Allergies  Current Problems (verified) has Hyperlipidemia, mixed; Hypertension; Prediabetes; Vitamin D deficiency; Obesity (BMI 30.0-34.9); OSA on CPAP; GERD (gastroesophageal reflux disease); Medication management; Tear of left supraspinatus tendon; Impingement syndrome of left shoulder; Tendinopathy of left biceps; FHx: heart disease; and Former smoker on their problem list.  Screening Tests Immunization History  Administered Date(s) Administered  . Influenza, High Dose Seasonal PF 10/21/2015, 12/24/2016, 01/10/2018  . PPD Test 11/10/2013, 11/11/2014  . Pneumococcal Conjugate-13 10/21/2015  . Pneumococcal Polysaccharide-23 05/31/2016  . Pneumococcal-Unspecified 04/15/2009  . Td 11/17/2004  . Tdap 11/11/2014  . Zoster 10/28/2012    Preventative care: Last colonoscopy: 04/2016 Prior vaccinations: TD or Tdap: 2016  Influenza: TODAY  Pneumococcal: 2018 Prevnar13: 2017 Shingles/Zostavax: 2014  Names of Other Physician/Practitioners you currently use: 1. [facility] here for primary care 2. Dr. Delman Cheadle, eye doctor, last visit 2018 3. Dr. Aggie Moats, dentist, last visit 11/2016 Patient Care Team: [name], MD as PCP - General  Surgical: He  has a past surgical history that includes Spine surgery. Family His family history includes COPD in his mother; Colon cancer in his father; Diabetes in his brother; Heart disease in his mother; Heart failure in his father and mother; Hypertension in his mother. Social history  He reports that he quit smoking about 28 years ago. His smoking use included cigarettes. He has a 32.00 pack-year smoking history. He has never used smokeless tobacco. He reports current alcohol use of about 22.0 standard drinks of alcohol per week. He reports that he does not use drugs.  MEDICARE WELLNESS  OBJECTIVES: Physical activity:   Cardiac risk factors:   Depression/mood screen:   Depression screen [facility] 2/9 02/10/2018  Decreased Interest 0  Down, Depressed, Hopeless 0  PHQ - 2 Score 0    ADLs:  In your present state of health, do you have any difficulty performing the following activities: 02/10/2018 11/08/2017  Hearing? N N  Vision? N N  Difficulty concentrating or making decisions? N N  Walking or climbing stairs? N N  Dressing or bathing? N N  Doing errands, shopping? N N  Some recent data might be hidden    Cognitive Testing  Alert? Yes  Normal Appearance?Yes  Oriented to person? Yes  Place? Yes   Time? Yes  Recall of three objects?  Yes  Can perform simple calculations? Yes  Displays appropriate judgment?Yes  Can read the correct time from a watch face?Yes  EOL planning:     Objective:   There were no vitals filed for this visit. There is no height or weight on file to calculate BMI.  General appearance: alert, no distress, WD/WN, male HEENT: normocephalic, sclerae anicteric, TMs pearly, nares patent, no discharge or erythema, pharynx normal  Oral cavity: MMM, no lesions Neck: supple, no lymphadenopathy, no thyromegaly, no masses Heart: RRR, normal S1, S2, no murmurs Lungs: CTA bilaterally, no wheezes, rhonchi, or rales Abdomen: +bs, soft, non tender, non distended, no masses, no hepatomegaly, no splenomegaly Musculoskeletal: nontender, no swelling, no obvious deformity Extremities: no edema, no cyanosis, no clubbing Pulses: 2+ symmetric, upper and lower extremities, normal cap refill Neurological: alert, oriented x 3, CN2-12 intact, strength normal upper extremities and lower extremities, sensation normal throughout, DTRs 2+ throughout, no cerebellar signs, gait normal Psychiatric: normal affect, behavior normal, pleasant   Medicare Attestation I have personally reviewed: The patient's medical and social history Their use of alcohol, tobacco or illicit  drugs Their current medications and supplements The patient's functional ability including ADLs,fall risks, home safety risks, cognitive, and hearing and visual impairment Diet and physical activities Evidence for depression or mood disorders  The patient's weight, height, BMI, and visual acuity have been recorded in the chart.  I have made referrals, counseling, and provided education to the patient based on review of the above and I have provided the patient with a written personalized care plan for preventive services.     Garnet Sierras, NP   02/17/2018

## 2018-02-18 ENCOUNTER — Ambulatory Visit: Payer: Self-pay | Admitting: Adult Health Nurse Practitioner

## 2018-02-26 NOTE — Progress Notes (Signed)
WELCOME TO MEDICARE WELLNESS VISIT AND FOLLOW UP Assessment:   Encounter for Medicare annual wellness exam  Essential hypertension - continue medications, DASH diet, exercise and monitor at home. Call if greater than 130/80.  -     CBC with Differential/Platelet -     CMP/GFR -     TSH  OSA on CPAP Weight loss advised, continue CPAP  Mixed hyperlipidemia -continue medications, check lipids, decrease fatty foods, increase activity.  -     Lipid panel  Prediabetes Discussed general issues about diabetes pathophysiology and management., Educational material distributed., Suggested low cholesterol diet., Encouraged aerobic exercise., Discussed foot care., Reminded to get yearly retinal exam.  Medication management -     Magnesium  Gastroesophageal reflux disease, esophagitis presence not specified Continue PPI/H2 blocker, diet discussed  Vitamin D deficiency Continue supplement  Obesity (BMI 30-34.9) - long discussion about weight loss, diet, and exercise  Former smoker -     DG Chest 2 View; Future  Dysuria -     Urine Culture -     Urinalysis, Routine w reflex microscopic -     sulfamethoxazole-trimethoprim (BACTRIM DS,SEPTRA DS) 800-160 MG tablet; Take 1 tablet by mouth 2 (two) times daily for 14 days.    Over 30 minutes of exam, counseling, chart review, and critical decision making was performed  Future Appointments  Date Time Provider [place]  05/12/2018 10:30 AM Vicie Mutters, PA-C [facility] None  08/13/2018  2:00 PM [name], MD [facility] None     Plan:   During the course of the visit the patient was educated and counseled about appropriate screening and preventive services including:    Pneumococcal vaccine   Influenza vaccine  Prevnar 13  Td vaccine  Screening electrocardiogram  Colorectal cancer screening  Diabetes screening  Glaucoma screening  Nutrition counseling    Subjective:  Jason Dennis is a 67 y.o. male  who presents for Medicare Annual Wellness Visit and 3 month follow up for HTN, hyperlipidemia, prediabetes, and vitamin D Def.  Sunday morning woke up with burning with urination x 3-4 days. He states there might have been a spot of blood in his underwear as well. He drank a bunch of water and it helped.   He reports chronic right knee pain, previously getting injections by ortho, requested referral back.   BMI is Body mass index is 31.57 kg/m., he is not currently working on diet and exercise.  He is on CPAP for OSA.  Wt Readings from Last 3 Encounters:  02/28/18 223 lb 3.2 oz (101.2 kg)  02/10/18 220 lb 9.6 oz (100.1 kg)  11/08/17 216 lb (98 kg)    His blood pressure has been controlled at home, today their BP is BP: 122/80   He does workout. He denies chest pain, shortness of breath, dizziness.   He is on cholesterol medication and denies myalgias. His cholesterol is at goal. The cholesterol last visit was:   Lab Results  Component Value Date   CHOL 175 02/10/2018   HDL 51 02/10/2018   LDLCALC 97 02/10/2018   TRIG 177 (H) 02/10/2018   CHOLHDL 3.4 02/10/2018   He does have diet controlled prediabetes.  He reports that he is doing well with diet and exercise.   Lab Results  Component Value Date   HGBA1C 5.4 02/10/2018    Last GFR Lab Results  Component Value Date   GFRNONAA 82 02/10/2018    Patient is on Vitamin D supplement.   Lab Results  Component Value Date   VD25OH 61 02/10/2018       Medication Review: Current Outpatient Medications on File Prior to Visit  Medication Sig Dispense Refill  . atorvastatin (LIPITOR) 80 MG tablet TAKE 1 TABLET BY MOUTH ONCE DAILY FOR CHOLESTEROL 90 tablet 1  . cholecalciferol (VITAMIN D) 1000 UNITS tablet Take 5,000 Units by mouth daily.     . meclizine (ANTIVERT) 25 MG tablet TAKE 1 TABLET BY MOUTH THREE TIMES DAILY AS NEEDED FOR DIZZINESS OR  VERTIGO 90 tablet 0  . Omega-3 Fatty Acids (FISH OIL) 1000 MG CAPS Take by mouth.    Marland Kitchen  omeprazole (PRILOSEC) 20 MG capsule Take 20 mg by mouth daily.    . phentermine (ADIPEX-P) 37.5 MG tablet TAKE 1/2 TO 1 (ONE-HALF TO ONE) TABLET BY MOUTH ONCE DAILY IN THE MORNING FOR WEIGHT LOSS 30 tablet 2   No current facility-administered medications on file prior to visit.     Allergies: No Active Allergies  Current Problems (verified) has Hyperlipidemia, mixed; Hypertension; Prediabetes; Vitamin D deficiency; Obesity (BMI 30.0-34.9); OSA on CPAP; GERD (gastroesophageal reflux disease); Medication management; Tear of left supraspinatus tendon; Impingement syndrome of left shoulder; Tendinopathy of left biceps; FHx: heart disease; and Former smoker on their problem list.  Screening Tests Immunization History  Administered Date(s) Administered  . Influenza, High Dose Seasonal PF 10/21/2015, 12/24/2016, 01/10/2018  . PPD Test 11/10/2013, 11/11/2014  . Pneumococcal Conjugate-13 10/21/2015  . Pneumococcal Polysaccharide-23 05/31/2016  . Pneumococcal-Unspecified 04/15/2009  . Td 11/17/2004  . Tdap 11/11/2014  . Zoster 10/28/2012    Preventative care: Last colonoscopy: 04/2016 due 2023 CXR 2008  Prior vaccinations: TD or Tdap: 2016  Influenza: 2019 Pneumococcal: 2018 Prevnar13: 2017 Shingles/Zostavax: 2014  Names of Other Physician/Practitioners you currently use: 1. [facility] here for primary care 2. Dr. Delman Cheadle, eye doctor, last visit 2019 3. Dr. Aggie Moats, dentist, last visit 2019, q60m  Patient Care Team: [name], MD as PCP - General  Surgical: He  has a past surgical history that includes Spine surgery. Family His family history includes COPD in his mother; Colon cancer in his father; Diabetes in his brother; Heart disease in his mother; Heart failure in his father and mother; Hypertension in his mother. Social history  He reports that he quit smoking about 28 years ago. His smoking use included cigarettes. He has a 32.00  pack-year smoking history. He has never used smokeless tobacco. He reports current alcohol use of about 22.0 standard drinks of alcohol per week. He reports that he does not use drugs.  MEDICARE WELLNESS OBJECTIVES: Physical activity: Current Exercise Habits: The patient does not participate in regular exercise at present Cardiac risk factors: Cardiac Risk Factors include: advanced age (>41men, >47 women);hypertension;dyslipidemia;sedentary lifestyle;obesity (BMI >30kg/m2);male gender Depression/mood screen:   Depression screen [facility] 2/9 02/28/2018  Decreased Interest 0  Down, Depressed, Hopeless 0  PHQ - 2 Score 0    ADLs:  In your present state of health, do you have any difficulty performing the following activities: 02/28/2018 02/10/2018  Hearing? N N  Vision? N N  Difficulty concentrating or making decisions? N N  Walking or climbing stairs? N N  Dressing or bathing? N N  Doing errands, shopping? N N  Some recent data might be hidden    Cognitive Testing  Alert? Yes  Normal Appearance?Yes  Oriented to person? Yes  Place? Yes   Time? Yes  Recall of three objects?  Yes  Can perform simple calculations?  Yes  Displays appropriate judgment?Yes  Can read the correct time from a watch face?Yes  EOL planning: Does Patient Have a Medical Advance Directive?: Yes Type of Advance Directive: Healthcare Power of Attorney, Living will Copy of [place] in Chart?: No - copy requested   Objective:   Today's Vitals   02/28/18 0958  BP: 122/80  Pulse: 74  Temp: 98.5 F (36.9 C)  SpO2: 95%  Weight: 223 lb 3.2 oz (101.2 kg)  Height: 5' 10.5" (1.791 m)  PainSc: 5    Body mass index is 31.57 kg/m.  General appearance: alert, no distress, WD/WN, male HEENT: normocephalic, sclerae anicteric, TMs pearly, nares patent, no discharge or erythema, pharynx normal Oral cavity: MMM, no lesions Neck: supple, no lymphadenopathy, no thyromegaly, no masses Heart: RRR, normal  S1, S2, no murmurs Lungs: CTA bilaterally, no wheezes, rhonchi, or rales Abdomen: +bs, soft, non tender, non distended, no masses, no hepatomegaly, no splenomegaly Musculoskeletal: nontender, no swelling, no obvious deformity Extremities: no edema, no cyanosis, no clubbing Pulses: 2+ symmetric, upper and lower extremities, normal cap refill Neurological: alert, oriented x 3, CN2-12 intact, strength normal upper extremities and lower extremities, sensation normal throughout, DTRs 2+ throughout, no cerebellar signs, gait normal Psychiatric: normal affect, behavior normal, pleasant   Medicare Attestation I have personally reviewed: The patient's medical and social history Their use of alcohol, tobacco or illicit drugs Their current medications and supplements The patient's functional ability including ADLs,fall risks, home safety risks, cognitive, and hearing and visual impairment Diet and physical activities Evidence for depression or mood disorders  The patient's weight, height, BMI, and visual acuity have been recorded in the chart.  I have made referrals, counseling, and provided education to the patient based on review of the above and I have provided the patient with a written personalized care plan for preventive services.     Vicie Mutters, PA-C   02/28/2018

## 2018-02-28 ENCOUNTER — Ambulatory Visit (INDEPENDENT_AMBULATORY_CARE_PROVIDER_SITE_OTHER): Payer: Medicare HMO | Admitting: Physician Assistant

## 2018-02-28 ENCOUNTER — Encounter: Payer: Self-pay | Admitting: Physician Assistant

## 2018-02-28 VITALS — BP 122/80 | HR 74 | Temp 98.5°F | Ht 70.5 in | Wt 223.2 lb

## 2018-02-28 DIAGNOSIS — Z9989 Dependence on other enabling machines and devices: Secondary | ICD-10-CM

## 2018-02-28 DIAGNOSIS — R7303 Prediabetes: Secondary | ICD-10-CM | POA: Diagnosis not present

## 2018-02-28 DIAGNOSIS — Z0001 Encounter for general adult medical examination with abnormal findings: Secondary | ICD-10-CM

## 2018-02-28 DIAGNOSIS — R300 Dysuria: Secondary | ICD-10-CM

## 2018-02-28 DIAGNOSIS — I10 Essential (primary) hypertension: Secondary | ICD-10-CM

## 2018-02-28 DIAGNOSIS — R6889 Other general symptoms and signs: Secondary | ICD-10-CM | POA: Diagnosis not present

## 2018-02-28 DIAGNOSIS — E559 Vitamin D deficiency, unspecified: Secondary | ICD-10-CM | POA: Diagnosis not present

## 2018-02-28 DIAGNOSIS — G4733 Obstructive sleep apnea (adult) (pediatric): Secondary | ICD-10-CM

## 2018-02-28 DIAGNOSIS — Z0000 Encounter for general adult medical examination without abnormal findings: Secondary | ICD-10-CM

## 2018-02-28 DIAGNOSIS — E782 Mixed hyperlipidemia: Secondary | ICD-10-CM

## 2018-02-28 DIAGNOSIS — K219 Gastro-esophageal reflux disease without esophagitis: Secondary | ICD-10-CM

## 2018-02-28 DIAGNOSIS — Z79899 Other long term (current) drug therapy: Secondary | ICD-10-CM | POA: Diagnosis not present

## 2018-02-28 DIAGNOSIS — Z87891 Personal history of nicotine dependence: Secondary | ICD-10-CM | POA: Diagnosis not present

## 2018-02-28 MED ORDER — SULFAMETHOXAZOLE-TRIMETHOPRIM 800-160 MG PO TABS: 1.0000 | ORAL_TABLET | Freq: Two times a day (BID) | ORAL | 0 refills | Status: AC

## 2018-02-28 NOTE — Patient Instructions (Addendum)
Check out  Mini habits for weight loss book  2 apps for tracking food is myfitness pal  loseit OR can take picture of your food  WATER IS IMPORTANT  Being dehydrated can hurt your kidneys, cause fatigue, headaches, muscle aches, joint pain, and dry skin/nails so please increase your fluids.   Drink 80-100 oz a day of water, measure it out! Eat 3 meals a day, have to do breakfast, eat protein- hard boiled eggs, protein bar like nature valley protein bar, greek yogurt like oikos triple zero, chobani 100, or light n fit greek  Can check out plantnanny app on your phone to help you keep track of your water   INFORMATION ABOUT YOUR XRAY  [facility] [place], go to radiology and give them your name. They will have the order and take you back. You do not any paper work, I should get the result back today or tomorrow. This order is good for a year.         When it comes to diets, agreement about the perfect plan isn't easy to find, even among the experts. Experts at the [employer] developed an idea known as the Healthy Eating Plate. Just imagine a plate divided into logical, healthy portions.  The emphasis is on diet quality:  Load up on vegetables and fruits - one-half of your plate: Aim for color and variety, and remember that potatoes don't count.  Go for whole grains - one-quarter of your plate: Whole wheat, barley, wheat berries, quinoa, oats, brown rice, and foods made with them. If you want pasta, go with whole wheat pasta.  Protein power - one-quarter of your plate: Fish, chicken, beans, and nuts are all healthy, versatile protein sources. Limit red meat.  The diet, however, does go beyond the plate, offering a few other suggestions.  Use healthy plant oils, such as olive, canola, soy, corn, sunflower and peanut. Check the labels, and avoid partially hydrogenated oil, which have unhealthy trans fats.  If you're thirsty, drink water. Coffee  and tea are good in moderation, but skip sugary drinks and limit milk and dairy products to one or two daily servings.  The type of carbohydrate in the diet is more important than the amount. Some sources of carbohydrates, such as vegetables, fruits, whole grains, and beans-are healthier than others.  Finally, stay active.   Google mindful eating and here are some tips and tricks below.   Rate your hunger before you eat on a scale of 1-10, try to eat closer to a 6 or higher. And if you are at below that, why are you eating? Slow down and listen to your body.

## 2018-03-01 LAB — URINALYSIS, ROUTINE W REFLEX MICROSCOPIC
Bacteria, UA: NONE SEEN /HPF
Bilirubin Urine: NEGATIVE
Glucose, UA: NEGATIVE
HYALINE CAST: NONE SEEN /LPF
Hgb urine dipstick: NEGATIVE
Ketones, ur: NEGATIVE
Nitrite: NEGATIVE
Protein, ur: NEGATIVE
RBC / HPF: NONE SEEN /HPF (ref 0–2)
Specific Gravity, Urine: 1.025 (ref 1.001–1.03)
Squamous Epithelial / HPF: NONE SEEN /HPF (ref <=5)
pH: <=5 (ref 5.0–8.0)

## 2018-03-01 LAB — URINE CULTURE
MICRO NUMBER: 91546976
Result:: NO GROWTH
SPECIMEN QUALITY:: ADEQUATE

## 2018-04-04 ENCOUNTER — Other Ambulatory Visit: Payer: Self-pay | Admitting: Internal Medicine

## 2018-04-28 ENCOUNTER — Other Ambulatory Visit: Payer: Self-pay | Admitting: Internal Medicine

## 2018-05-08 NOTE — Progress Notes (Signed)
FOLLOW UP  Assessment and Plan:   Essential hypertension -     CBC with Differential/Platelet -     COMPLETE METABOLIC PANEL WITH GFR -     TSH - continue medications, DASH diet, exercise and monitor at home. Call if greater than 130/80.   OSA on CPAP Continue CPAP, weight loss advise d Hyperlipidemia, mixed check lipids decrease fatty foods increase activity. -     Lipid panel  Abnormal glucose Discussed disease progression and risks Discussed diet/exercise, weight management and risk modification -     Hemoglobin A1c  Medication management -     Magnesium  Obesity (BMI 30.0-34.9)  Overweight  - long discussion about weight loss, diet, and exercise -recommended diet heavy in fruits and veggies and low in animal meats, cheeses, and dairy products -     phentermine (ADIPEX-P) 37.5 MG tablet; TAKE 1/2 TO 1 (ONE-HALF TO ONE) TABLET BY MOUTH ONCE DAILY IN THE MORNING FOR WEIGHT LOSS    Continue diet and meds as discussed. Further disposition pending results of labs. Over 30 minutes of exam, counseling, chart review, and critical decision making was performed  Future Appointments  Date Time Provider [place]  08/13/2018  2:00 PM [name], MD [facility] None  03/04/2019 11:15 AM Vicie Mutters, PA-C [facility] None     HPI 68 y.o. male  presents for 3 month follow up on hypertension, cholesterol, prediabetes, and vitamin D deficiency.   His blood pressure has been controlled at home, today their BP is BP: 122/70   He does workout, walks at [employer]. He denies chest pain, shortness of breath, dizziness. BMI is Body mass index is 32 kg/m., he is working on diet and exercise. He is on CPAP for OSA. He has been off phentermine and would like a trial back on it. He has cut back on his beer consumption.  Wt Readings from Last 3 Encounters:  05/12/18 226 lb 3.2 oz (102.6 kg)  02/28/18 223 lb 3.2 oz (101.2 kg)  02/10/18 220 lb 9.6 oz  (100.1 kg)    He  is  on cholesterol medication and denies myalgias. His cholesterol is at goal. The cholesterol last visit was:   Lab Results  Component Value Date   CHOL 175 02/10/2018   HDL 51 02/10/2018   LDLCALC 97 02/10/2018   TRIG 177 (H) 02/10/2018   CHOLHDL 3.4 02/10/2018     He has been working on diet and exercise for prediabetes, and denies paresthesia of the feet, polydipsia and polyuria. Last A1C in the office was:  Lab Results  Component Value Date   HGBA1C 5.4 02/10/2018   Patient is on Vitamin D supplement.   Lab Results  Component Value Date   VD25OH 61 02/10/2018      Current Medications:  Current Outpatient Medications on File Prior to Visit  Medication Sig  . atorvastatin (LIPITOR) 80 MG tablet TAKE 1 TABLET BY MOUTH ONCE DAILY FOR CHOLESTEROL  . cholecalciferol (VITAMIN D) 1000 UNITS tablet Take 5,000 Units by mouth daily.   . meclizine (ANTIVERT) 25 MG tablet TAKE 1 TABLET BY MOUTH THREE TIMES DAILY AS NEEDED FOR DIZZINESS OR VERTIGO  . meloxicam (MOBIC) 15 MG tablet TAKE 1 TABLET BY MOUTH ONCE DAILY AFTER A MEAL FOR ACHES AND PAINS  . Omega-3 Fatty Acids (FISH OIL) 1000 MG CAPS Take by mouth.  Marland Kitchen omeprazole (PRILOSEC) 20 MG capsule Take 20 mg by mouth daily.  . phentermine (ADIPEX-P) 37.5 MG tablet  TAKE 1/2 TO 1 (ONE-HALF TO ONE) TABLET BY MOUTH ONCE DAILY IN THE MORNING FOR WEIGHT LOSS (Patient not taking: Reported on 05/12/2018)   No current facility-administered medications on file prior to visit.     Medical History:  Past Medical History:  Diagnosis Date  . GERD (gastroesophageal reflux disease)   . Hyperlipidemia   . Obesity   . Prediabetes   . Sleep apnea    CPAP  . Vitamin D deficiency    Allergies: No Active Allergies   Review of Systems:  Review of Systems  Constitutional: Negative.   HENT: Negative.   Eyes: Negative.   Respiratory: Negative.   Cardiovascular: Negative.   Gastrointestinal: Negative.   Genitourinary: Negative.    Musculoskeletal: Negative.   Skin: Negative.     Family history- Review and unchanged Social history- Review and unchanged Physical Exam: BP 122/70   Pulse 76   Temp 98.1 F (36.7 C)   Ht 5' 10.5" (1.791 m)   Wt 226 lb 3.2 oz (102.6 kg)   SpO2 97%   BMI 32.00 kg/m  Wt Readings from Last 3 Encounters:  05/12/18 226 lb 3.2 oz (102.6 kg)  02/28/18 223 lb 3.2 oz (101.2 kg)  02/10/18 220 lb 9.6 oz (100.1 kg)   General Appearance: Well nourished, in no apparent distress. Eyes: PERRLA, EOMs, conjunctiva no swelling or erythema Sinuses: No Frontal/maxillary tenderness ENT/Mouth: Ext aud canals clear, TMs without erythema, bulging. No erythema, swelling, or exudate on post pharynx.  Tonsils not swollen or erythematous. Hearing normal.  Neck: Supple, thyroid normal.  Respiratory: Respiratory effort normal, BS equal bilaterally without rales, rhonchi, wheezing or stridor.  Cardio: RRR with no MRGs. Brisk peripheral pulses without edema.  Abdomen: Soft, + BS,  Non tender, no guarding, rebound, hernias, masses. Lymphatics: Non tender without lymphadenopathy.  Musculoskeletal: Full ROM, 5/5 strength, Normal gait Skin: Warm, dry without rashes, lesions, ecchymosis.  Neuro: Cranial nerves intact. Normal muscle tone, no cerebellar symptoms. Psych: Awake and oriented X 3, normal affect, Insight and Judgment appropriate.    Vicie Mutters, PA-C 10:31 AM [facility]

## 2018-05-12 ENCOUNTER — Encounter: Payer: Self-pay | Admitting: Physician Assistant

## 2018-05-12 ENCOUNTER — Ambulatory Visit (INDEPENDENT_AMBULATORY_CARE_PROVIDER_SITE_OTHER): Payer: Medicare HMO | Admitting: Physician Assistant

## 2018-05-12 VITALS — BP 122/70 | HR 76 | Temp 98.1°F | Ht 70.5 in | Wt 226.2 lb

## 2018-05-12 DIAGNOSIS — I10 Essential (primary) hypertension: Secondary | ICD-10-CM

## 2018-05-12 DIAGNOSIS — G4733 Obstructive sleep apnea (adult) (pediatric): Secondary | ICD-10-CM | POA: Diagnosis not present

## 2018-05-12 DIAGNOSIS — Z79899 Other long term (current) drug therapy: Secondary | ICD-10-CM | POA: Diagnosis not present

## 2018-05-12 DIAGNOSIS — R7309 Other abnormal glucose: Secondary | ICD-10-CM

## 2018-05-12 DIAGNOSIS — E782 Mixed hyperlipidemia: Secondary | ICD-10-CM | POA: Diagnosis not present

## 2018-05-12 DIAGNOSIS — Z9989 Dependence on other enabling machines and devices: Secondary | ICD-10-CM

## 2018-05-12 DIAGNOSIS — E669 Obesity, unspecified: Secondary | ICD-10-CM | POA: Diagnosis not present

## 2018-05-12 MED ORDER — PHENTERMINE HCL 37.5 MG PO TABS: ORAL_TABLET | ORAL | 2 refills | Status: DC

## 2018-05-12 NOTE — Patient Instructions (Addendum)
    When it comes to diets, agreement about the perfect plan isn't easy to find, even among the experts. Experts at the [place] developed an idea known as the Healthy Eating Plate. Just imagine a plate divided into logical, healthy portions.  The emphasis is on diet quality:  Load up on vegetables and fruits - one-half of your plate: Aim for color and variety, and remember that potatoes don't count.  Go for whole grains - one-quarter of your plate: Whole wheat, barley, wheat berries, quinoa, oats, brown rice, and foods made with them. If you want pasta, go with whole wheat pasta.  Protein power - one-quarter of your plate: Fish, chicken, beans, and nuts are all healthy, versatile protein sources. Limit red meat.  The diet, however, does go beyond the plate, offering a few other suggestions.  Use healthy plant oils, such as olive, canola, soy, corn, sunflower and peanut. Check the labels, and avoid partially hydrogenated oil, which have unhealthy trans fats.  If you're thirsty, drink water. Coffee and tea are good in moderation, but skip sugary drinks and limit milk and dairy products to one or two daily servings.  The type of carbohydrate in the diet is more important than the amount. Some sources of carbohydrates, such as vegetables, fruits, whole grains, and beans-are healthier than others.  Finally, stay active.   Drink 1/2 your body weight in fluid ounces of water daily; drink a tall glass of water 30 min before meals  Don't eat until you're stuffed- listen to your stomach and eat until you are 80% full   Try eating off of a salad plate; wait 10 min after finishing before going back for seconds  Start by eating the vegetables on your plate; aim for 50% of your meals to be fruits or vegetables  Then eat your protein - lean meats (grass fed if possible), fish, beans, nuts in moderation  Eat your carbs/starch last ONLY if you still are hungry. If you can,  stop before finishing it all  Avoid sugar and flour - the closer it looks to it's original form in nature, typically the better it is for you  Splurge in moderation - "assign" days when you get to splurge and have the "bad stuff" - I like to follow a 80% - 20% plan- "good" choices 80 % of the time, "bad" choices in moderation 20% of the time  Simple equation is: Calories out > calories in = weight loss - even if you eat the bad stuff, if you limit portions, you will still lose weight  Coronavirus or Covid 19  . You will use the same precautions as you would with the flu virus.  . While we do not have a vaccine against Covid 19 and will not for several years, WE do have a vaccine against the flu. Please get this if you have not yet.  Marland Kitchen Please wash your hands frequently. Wendee Copp your hands before you eat or touch your face.  . Avoid touching your face, eyes, nose, mouth as much as possible.  . Routinely clean frequently touched items such as doorknobs, keyboards, and phones.  . Avoid crowds of people.   To get more information from reputable sources:  You can call this hotline set up by Lauro Franklin [phone]  You can visit these websites: CDC.gov WHO.int

## 2018-05-13 LAB — CBC WITH DIFFERENTIAL/PLATELET
Absolute Monocytes: 547 cells/uL (ref 200–950)
Basophils Absolute: 51 cells/uL (ref 0–200)
Basophils Relative: 1.1 %
Eosinophils Absolute: 248 cells/uL (ref 15–500)
Eosinophils Relative: 5.4 %
HCT: 41.7 % (ref 38.5–50.0)
HEMOGLOBIN: 14.1 g/dL (ref 13.2–17.1)
Lymphs Abs: 1049 cells/uL (ref 850–3900)
MCH: 30.7 pg (ref 27.0–33.0)
MCHC: 33.8 g/dL (ref 32.0–36.0)
MCV: 90.8 fL (ref 80.0–100.0)
MPV: 10.3 fL (ref 7.5–12.5)
Monocytes Relative: 11.9 %
NEUTROS ABS: 2705 {cells}/uL (ref 1500–7800)
Neutrophils Relative %: 58.8 %
Platelets: 241 10*3/uL (ref 140–400)
RBC: 4.59 10*6/uL (ref 4.20–5.80)
RDW: 12.1 % (ref 11.0–15.0)
Total Lymphocyte: 22.8 %
WBC: 4.6 10*3/uL (ref 3.8–10.8)

## 2018-05-13 LAB — COMPLETE METABOLIC PANEL WITH GFR
AG RATIO: 2.3 (calc) (ref 1.0–2.5)
ALT: 37 U/L (ref 9–46)
AST: 21 U/L (ref 10–35)
Albumin: 4.5 g/dL (ref 3.6–5.1)
Alkaline phosphatase (APISO): 83 U/L (ref 35–144)
BUN: 17 mg/dL (ref 7–25)
CO2: 27 mmol/L (ref 20–32)
Calcium: 9.8 mg/dL (ref 8.6–10.3)
Chloride: 105 mmol/L (ref 98–110)
Creat: 1.12 mg/dL (ref 0.70–1.25)
GFR, Est African American: 78 mL/min/{1.73_m2} (ref >=60)
GFR, Est Non African American: 68 mL/min/{1.73_m2} (ref >=60)
Globulin: 2 g/dL (calc) (ref 1.9–3.7)
Glucose, Bld: 102 mg/dL — ABNORMAL HIGH (ref 65–99)
POTASSIUM: 4.4 mmol/L (ref 3.5–5.3)
Sodium: 139 mmol/L (ref 135–146)
Total Bilirubin: 0.9 mg/dL (ref 0.2–1.2)
Total Protein: 6.5 g/dL (ref 6.1–8.1)

## 2018-05-13 LAB — HEMOGLOBIN A1C
EAG (MMOL/L): 6 (calc)
Hgb A1c MFr Bld: 5.4 % of total Hgb (ref <5.7)
Mean Plasma Glucose: 108 (calc)

## 2018-05-13 LAB — LIPID PANEL
Cholesterol: 152 mg/dL (ref <200)
HDL: 45 mg/dL (ref >=40)
LDL Cholesterol (Calc): 81 mg/dL (calc)
NON-HDL CHOLESTEROL (CALC): 107 mg/dL (ref <130)
Total CHOL/HDL Ratio: 3.4 (calc) (ref <5.0)
Triglycerides: 165 mg/dL — ABNORMAL HIGH (ref <150)

## 2018-05-13 LAB — MAGNESIUM: Magnesium: 1.8 mg/dL (ref 1.5–2.5)

## 2018-05-13 LAB — TSH: TSH: 1.67 mIU/L (ref 0.40–4.50)

## 2018-06-11 ENCOUNTER — Other Ambulatory Visit: Payer: Self-pay | Admitting: Internal Medicine

## 2018-06-11 DIAGNOSIS — E669 Obesity, unspecified: Secondary | ICD-10-CM

## 2018-06-11 MED ORDER — PHENTERMINE HCL 37.5 MG PO TABS: ORAL_TABLET | ORAL | 0 refills | Status: DC

## 2018-07-31 ENCOUNTER — Other Ambulatory Visit: Payer: Self-pay | Admitting: Internal Medicine

## 2018-08-12 ENCOUNTER — Encounter: Payer: Self-pay | Admitting: Internal Medicine

## 2018-08-12 NOTE — Progress Notes (Signed)
[facility]  [name], M.D.        Uvaldo Bristle. Silverio Lay, P.A.-C         Liane Comber, [place]                [address] [place] [address] Telephone [phone] Telefax [phone] Annual  Screening/Preventative Visit  & Comprehensive Evaluation & Examination     This very nice 68 y.o. MWM presents for a Screening /Preventative Visit & comprehensive evaluation and management of multiple medical co-morbidities.  Patient has been followed for HTN, HLD, Prediabetes and Vitamin D Deficiency. Patient is on CPAP for OSA with improved sleep hygiene and less daytime hypersomnolence.  Patient has GERD controlled with diet & meds.      HTN predates circa 2016. Patient's BP has been controlled at home.  Today's BP is at goal - 124/76.  In 2018, he had a Cardiac MRI by Dr Dorris Carnes & it was essentially Normal. Patient denies any cardiac symptoms as chest pain, palpitations, shortness of breath, dizziness or ankle swelling.     Patient's hyperlipidemia is controlled with diet and Atorvastatin. Patient denies myalgias or other medication SE's. Last lipids were at goal with sl elevated Trig's: Lab Results  Component Value Date   CHOL 152 05/12/2018   HDL 45 05/12/2018   LDLCALC 81 05/12/2018   TRIG 165 (H) 05/12/2018   CHOLHDL 3.4 05/12/2018      Patient has hx/o prediabetes (A1c 5.9% / 2011) and patient denies reactive hypoglycemic symptoms, visual blurring, diabetic polys or paresthesias. Last A1c was Normal & at goal: Lab Results  Component Value Date   HGBA1C 5.4 05/12/2018       Finally, patient has history of Vitamin D Deficiency ("31" / 2008)  and last vitamin D was at goal: Lab Results  Component Value Date   VD25OH 61 02/10/2018   Current Outpatient Medications on File Prior to Visit  Medication Sig  . atorvastatin (LIPITOR) 80 MG tablet TAKE 1 TABLET BY MOUTH ONCE DAILY FOR  CHOLESTEROL  . cholecalciferol (VITAMIN D) 1000 UNITS tablet Take 5,000 Units by mouth daily.   . meclizine (ANTIVERT) 25 MG tablet TAKE 1 TABLET BY MOUTH THREE TIMES DAILY AS NEEDED FOR  DIZZINESS  OR  VERTIGO  . meloxicam (MOBIC) 15 MG tablet TAKE 1 TABLET BY MOUTH ONCE DAILY AFTER A MEAL FOR ACHES AND PAINS  . Omega-3 Fatty Acids (FISH OIL) 1000 MG CAPS Take by mouth.  Marland Kitchen omeprazole (PRILOSEC) 20 MG capsule Take 20 mg by mouth daily.  . phentermine (ADIPEX-P) 37.5 MG tablet Take 1/2 to 1 tablet every morning for Dieting & Weight Loss   No current facility-administered medications on file prior to visit.    No Known Allergies   Past Medical History:  Diagnosis Date  . GERD (gastroesophageal reflux disease)   . Hyperlipidemia   . Obesity   . Prediabetes   . Sleep apnea    CPAP  . Vitamin D deficiency    Health Maintenance  Topic Date Due  . INFLUENZA VACCINE  10/04/2018  . COLONOSCOPY  05/02/2021  . TETANUS/TDAP  11/10/2024  . Hepatitis C Screening  Completed  . PNA vac Low Risk Adult  Completed   Immunization History  Administered Date(s) Administered  . Influenza, High Dose Seasonal PF 10/21/2015, 12/24/2016, 01/10/2018  . PPD Test  11/10/2013, 11/11/2014  . Pneumococcal Conjugate-13 10/21/2015  . Pneumococcal Polysaccharide-23 05/31/2016  . Pneumococcal-Unspecified 04/15/2009  . Td 11/17/2004  . Tdap 11/11/2014  . Zoster 10/28/2012   Last Colon - 05/02/2016 - Dr Ardis Hughs - Recc 5 yr f/u due Mar 2023  Past Surgical History:  Procedure Laterality Date  . SPINE SURGERY     [phone]   Family History  Problem Relation Age of Onset  . Hypertension Mother   . Heart disease Mother   . COPD Mother   . Heart failure Mother   . Heart failure Father   . Colon cancer Father        died at 77;pt unsure age,never knew father  . Diabetes Brother    Social History   Socioeconomic History  . Marital status: Married    Spouse name: Helene Kelp  . Number of children: 2  daughters & 1 GSon  Occupational History  . Retire from [employer]  Tobacco Use  . Smoking status: Former Smoker    Packs/day: 1.00    Years: 32.00    Pack years: 32.00    Types: Cigarettes    Last attempt to quit: 02/03/1990    Years since quitting: 28.5  . Smokeless tobacco: Never Used  Substance and Sexual Activity  . Alcohol use: Yes    Alcohol/week: 22.0 standard drinks    Types: 14 Standard drinks or equivalent, 7 Cans of beer, 1 Shots of liquor per week    Comment: 7 beers a week plus 1 mixed drink daily.  . Drug use: No  . Sexual activity: Not Currently    ROS Constitutional: Denies fever, chills, weight loss/gain, headaches, insomnia,  night sweats or change in appetite. Does c/o fatigue. Eyes: Denies redness, blurred vision, diplopia, discharge, itchy or watery eyes.  ENT: Denies discharge, congestion, post nasal drip, epistaxis, sore throat, earache, hearing loss, dental pain, Tinnitus, Vertigo, Sinus pain or snoring.  Cardio: Denies chest pain, palpitations, irregular heartbeat, syncope, dyspnea, diaphoresis, orthopnea, PND, claudication or edema Respiratory: denies cough, dyspnea, DOE, pleurisy, hoarseness, laryngitis or wheezing.  Gastrointestinal: Denies dysphagia, heartburn, reflux, water brash, pain, cramps, nausea, vomiting, bloating, diarrhea, constipation, hematemesis, melena, hematochezia, jaundice or hemorrhoids Genitourinary: Denies dysuria, frequency, discharge, hematuria or flank pain. Has urgency, nocturia x 2-3 & occasional hesitancy. Musculoskeletal: Denies arthralgia, myalgia, stiffness, Jt. Swelling, pain, limp or strain/sprain. Denies Falls. Skin: Denies puritis, rash, hives, warts, acne, eczema or change in skin lesion Neuro: No weakness, tremor, incoordination, spasms, paresthesia or pain Psychiatric: Denies confusion, memory loss or sensory loss. Denies Depression. Endocrine: Denies change in weight, skin, hair change, nocturia, and paresthesia,  diabetic polys, visual blurring or hyper / hypo glycemic episodes.  Heme/Lymph: No excessive bleeding, bruising or enlarged lymph nodes.  Physical Exam  BP 124/76   Pulse 84   Temp 97.9 F (36.6 C)   Resp 16   Ht 5\' 10"  (1.778 m)   Wt 201 lb 12.8 oz (91.5 kg)   BMI 28.96 kg/m   General Appearance: Well nourished and well groomed and in no apparent distress.  Eyes: PERRLA, EOMs, conjunctiva no swelling or erythema, normal fundi and vessels. Sinuses: No frontal/maxillary tenderness ENT/Mouth: EACs patent / TMs  nl. Nares clear without erythema, swelling, mucoid exudates. Oral hygiene is good. No erythema, swelling, or exudate. Tongue normal, non-obstructing. Tonsils not swollen or erythematous. Hearing normal.  Neck: Supple, thyroid not palpable. No bruits, nodes or JVD. Respiratory: Respiratory effort normal.  BS equal and clear bilateral without rales,  rhonci, wheezing or stridor. Cardio: Heart sounds are normal with regular rate and rhythm and no murmurs, rubs or gallops. Peripheral pulses are normal and equal bilaterally without edema. No aortic or femoral bruits. Chest: symmetric with normal excursions and percussion.  Abdomen: Soft, with Nl bowel sounds. Nontender, no guarding, rebound, hernias, masses, or organomegaly.  Lymphatics: Non tender without lymphadenopathy.  Musculoskeletal: Full ROM all peripheral extremities, joint stability, 5/5 strength, and normal gait. Skin: Warm and dry without rashes, lesions, cyanosis, clubbing or  ecchymosis.  Neuro: Cranial nerves intact, reflexes equal bilaterally. Normal muscle tone, no cerebellar symptoms. Sensation intact.  Pysch: Alert and oriented X 3 with normal affect, insight and judgment appropriate.   Assessment and Plan  1. Annual Preventative/Screening Exam   2. Essential hypertension  - EKG 12-Lead - [place], RETROPERITNL ABD,  LTD - Urinalysis, Routine w reflex microscopic - Microalbumin / creatinine urine ratio - CBC with  Differential/Platelet - COMPLETE METABOLIC PANEL WITH GFR - Magnesium - TSH  3. Hyperlipidemia, mixed  - EKG 12-Lead - [place], RETROPERITNL ABD,  LTD - Lipid panel - TSH  4. Abnormal glucose  - EKG 12-Lead - [place], RETROPERITNL ABD,  LTD - Hemoglobin A1c - Insulin, random  5. Vitamin D deficiency  - VITAMIN D 25 Hydroxyl  6. Gastroesophageal reflux disease  - CBC with Differential/Platelet  7. OSA on CPAP   8. Screening for colorectal cancer  - POC Hemoccult Bld/Stl  9. Prostate cancer screening  - PSA  10. BPH with obstruction/lower urinary tract symptoms  - PSA  11. Screening for ischemic heart disease  - EKG 12-Lead  12. FHx: heart disease - EKG 12-Lead - [place], RETROPERITNL ABD,  LTD  13. Screening for AAA (aortic abdominal aneurysm)  - [place], RETROPERITNL ABD,  LTD  14. Medication management  - Urinalysis, Routine w reflex microscopic - Microalbumin / creatinine urine ratio - CBC with Differential/Platelet - COMPLETE METABOLIC PANEL WITH GFR - Magnesium - Lipid panel - TSH - Hemoglobin A1c - Insulin, random - VITAMIN D 25 Hydroxyl       Patient was counseled in prudent diet, weight control to achieve/maintain BMI less than 25, BP monitoring, regular exercise and medications as discussed.  Discussed med effects and SE's. Routine screening labs and tests as requested with regular follow-up as recommended. Over 40 minutes of exam, counseling, chart review and high complex critical decision making was performed   Kirtland Bouchard, MD

## 2018-08-12 NOTE — Patient Instructions (Signed)

## 2018-08-13 ENCOUNTER — Other Ambulatory Visit: Payer: Self-pay

## 2018-08-13 ENCOUNTER — Ambulatory Visit (INDEPENDENT_AMBULATORY_CARE_PROVIDER_SITE_OTHER): Payer: Medicare HMO | Admitting: Internal Medicine

## 2018-08-13 VITALS — BP 124/76 | HR 84 | Temp 97.9°F | Resp 16 | Ht 70.0 in | Wt 201.8 lb

## 2018-08-13 DIAGNOSIS — I10 Essential (primary) hypertension: Secondary | ICD-10-CM | POA: Diagnosis not present

## 2018-08-13 DIAGNOSIS — Z9989 Dependence on other enabling machines and devices: Secondary | ICD-10-CM

## 2018-08-13 DIAGNOSIS — E782 Mixed hyperlipidemia: Secondary | ICD-10-CM | POA: Diagnosis not present

## 2018-08-13 DIAGNOSIS — E559 Vitamin D deficiency, unspecified: Secondary | ICD-10-CM

## 2018-08-13 DIAGNOSIS — Z136 Encounter for screening for cardiovascular disorders: Secondary | ICD-10-CM

## 2018-08-13 DIAGNOSIS — R7309 Other abnormal glucose: Secondary | ICD-10-CM

## 2018-08-13 DIAGNOSIS — N138 Other obstructive and reflux uropathy: Secondary | ICD-10-CM | POA: Diagnosis not present

## 2018-08-13 DIAGNOSIS — Z87891 Personal history of nicotine dependence: Secondary | ICD-10-CM

## 2018-08-13 DIAGNOSIS — Z1212 Encounter for screening for malignant neoplasm of rectum: Secondary | ICD-10-CM

## 2018-08-13 DIAGNOSIS — Z125 Encounter for screening for malignant neoplasm of prostate: Secondary | ICD-10-CM | POA: Diagnosis not present

## 2018-08-13 DIAGNOSIS — Z8249 Family history of ischemic heart disease and other diseases of the circulatory system: Secondary | ICD-10-CM

## 2018-08-13 DIAGNOSIS — Z79899 Other long term (current) drug therapy: Secondary | ICD-10-CM

## 2018-08-13 DIAGNOSIS — K219 Gastro-esophageal reflux disease without esophagitis: Secondary | ICD-10-CM

## 2018-08-13 DIAGNOSIS — N401 Enlarged prostate with lower urinary tract symptoms: Secondary | ICD-10-CM | POA: Diagnosis not present

## 2018-08-13 DIAGNOSIS — Z0001 Encounter for general adult medical examination with abnormal findings: Secondary | ICD-10-CM

## 2018-08-13 DIAGNOSIS — G4733 Obstructive sleep apnea (adult) (pediatric): Secondary | ICD-10-CM

## 2018-08-13 DIAGNOSIS — Z0000 Encounter for general adult medical examination without abnormal findings: Secondary | ICD-10-CM | POA: Diagnosis not present

## 2018-08-13 DIAGNOSIS — Z1211 Encounter for screening for malignant neoplasm of colon: Secondary | ICD-10-CM

## 2018-08-14 LAB — CBC WITH DIFFERENTIAL/PLATELET
Absolute Monocytes: 464 cells/uL (ref 200–950)
Basophils Absolute: 31 cells/uL (ref 0–200)
Basophils Relative: 0.6 %
Eosinophils Absolute: 184 cells/uL (ref 15–500)
Eosinophils Relative: 3.6 %
HCT: 41.3 % (ref 38.5–50.0)
Hemoglobin: 14.1 g/dL (ref 13.2–17.1)
Lymphs Abs: 806 cells/uL — ABNORMAL LOW (ref 850–3900)
MCH: 30.7 pg (ref 27.0–33.0)
MCHC: 34.1 g/dL (ref 32.0–36.0)
MCV: 89.8 fL (ref 80.0–100.0)
MPV: 10.2 fL (ref 7.5–12.5)
Monocytes Relative: 9.1 %
Neutro Abs: 3616 cells/uL (ref 1500–7800)
Neutrophils Relative %: 70.9 %
Platelets: 211 10*3/uL (ref 140–400)
RBC: 4.6 10*6/uL (ref 4.20–5.80)
RDW: 12.1 % (ref 11.0–15.0)
Total Lymphocyte: 15.8 %
WBC: 5.1 10*3/uL (ref 3.8–10.8)

## 2018-08-14 LAB — URINALYSIS, ROUTINE W REFLEX MICROSCOPIC
Bilirubin Urine: NEGATIVE
Glucose, UA: NEGATIVE
Hgb urine dipstick: NEGATIVE
Ketones, ur: NEGATIVE
Leukocytes,Ua: NEGATIVE
Nitrite: NEGATIVE
Protein, ur: NEGATIVE
Specific Gravity, Urine: 1.026 (ref 1.001–1.03)
pH: 6 (ref 5.0–8.0)

## 2018-08-14 LAB — PSA: PSA: 0.8 ng/mL (ref <=4.0)

## 2018-08-14 LAB — COMPLETE METABOLIC PANEL WITH GFR
AG Ratio: 2.5 (calc) (ref 1.0–2.5)
ALT: 25 U/L (ref 9–46)
AST: 22 U/L (ref 10–35)
Albumin: 4.5 g/dL (ref 3.6–5.1)
Alkaline phosphatase (APISO): 90 U/L (ref 35–144)
BUN: 19 mg/dL (ref 7–25)
CO2: 26 mmol/L (ref 20–32)
Calcium: 9.5 mg/dL (ref 8.6–10.3)
Chloride: 106 mmol/L (ref 98–110)
Creat: 0.87 mg/dL (ref 0.70–1.25)
GFR, Est African American: 104 mL/min/{1.73_m2} (ref >=60)
GFR, Est Non African American: 89 mL/min/{1.73_m2} (ref >=60)
Globulin: 1.8 g/dL (calc) — ABNORMAL LOW (ref 1.9–3.7)
Glucose, Bld: 102 mg/dL — ABNORMAL HIGH (ref 65–99)
Potassium: 4.3 mmol/L (ref 3.5–5.3)
Sodium: 140 mmol/L (ref 135–146)
Total Bilirubin: 1 mg/dL (ref 0.2–1.2)
Total Protein: 6.3 g/dL (ref 6.1–8.1)

## 2018-08-14 LAB — LIPID PANEL
Cholesterol: 158 mg/dL (ref <200)
HDL: 51 mg/dL (ref >=40)
LDL Cholesterol (Calc): 88 mg/dL (calc)
Non-HDL Cholesterol (Calc): 107 mg/dL (calc) (ref <130)
Total CHOL/HDL Ratio: 3.1 (calc) (ref <5.0)
Triglycerides: 92 mg/dL (ref <150)

## 2018-08-14 LAB — MICROALBUMIN / CREATININE URINE RATIO
Creatinine, Urine: 168 mg/dL (ref 20–320)
Microalb Creat Ratio: 5 mcg/mg creat (ref <30)
Microalb, Ur: 0.9 mg/dL

## 2018-08-14 LAB — MAGNESIUM: Magnesium: 1.9 mg/dL (ref 1.5–2.5)

## 2018-08-14 LAB — HEMOGLOBIN A1C
Hgb A1c MFr Bld: 5.2 % of total Hgb (ref <5.7)
Mean Plasma Glucose: 103 (calc)
eAG (mmol/L): 5.7 (calc)

## 2018-08-14 LAB — TSH: TSH: 2.11 mIU/L (ref 0.40–4.50)

## 2018-08-14 LAB — INSULIN, RANDOM: Insulin: 7.6 u[IU]/mL

## 2018-08-14 LAB — VITAMIN D 25 HYDROXY (VIT D DEFICIENCY, FRACTURES): Vit D, 25-Hydroxy: 64 ng/mL (ref 30–100)

## 2018-08-16 ENCOUNTER — Encounter: Payer: Self-pay | Admitting: Internal Medicine

## 2018-09-08 ENCOUNTER — Other Ambulatory Visit: Payer: Self-pay | Admitting: Physician Assistant

## 2018-09-15 ENCOUNTER — Other Ambulatory Visit: Payer: Self-pay | Admitting: Internal Medicine

## 2018-09-16 DIAGNOSIS — H02403 Unspecified ptosis of bilateral eyelids: Secondary | ICD-10-CM | POA: Diagnosis not present

## 2018-09-16 DIAGNOSIS — H2513 Age-related nuclear cataract, bilateral: Secondary | ICD-10-CM | POA: Diagnosis not present

## 2018-09-16 DIAGNOSIS — H52203 Unspecified astigmatism, bilateral: Secondary | ICD-10-CM | POA: Diagnosis not present

## 2018-09-16 DIAGNOSIS — H4720 Unspecified optic atrophy: Secondary | ICD-10-CM | POA: Diagnosis not present

## 2018-10-02 DIAGNOSIS — R69 Illness, unspecified: Secondary | ICD-10-CM | POA: Diagnosis not present

## 2018-10-13 ENCOUNTER — Other Ambulatory Visit: Payer: Self-pay | Admitting: Internal Medicine

## 2018-10-14 DIAGNOSIS — R69 Illness, unspecified: Secondary | ICD-10-CM | POA: Diagnosis not present

## 2018-10-25 ENCOUNTER — Other Ambulatory Visit: Payer: Self-pay | Admitting: Internal Medicine

## 2018-10-25 MED ORDER — CYCLOBENZAPRINE HCL 10 MG PO TABS: ORAL_TABLET | ORAL | 0 refills | Status: DC

## 2018-10-30 ENCOUNTER — Other Ambulatory Visit: Payer: Self-pay | Admitting: Internal Medicine

## 2018-11-19 ENCOUNTER — Ambulatory Visit: Payer: Self-pay | Admitting: Adult Health

## 2018-11-19 NOTE — Progress Notes (Signed)
WELCOME TO MEDICARE WELLNESS VISIT AND FOLLOW UP Assessment:   Encounter for Medicare annual wellness exam 1 year  Essential hypertension - continue medications, DASH diet, exercise and monitor at home. Call if greater than 130/80.  -     CBC with Differential/Platelet -     CMP/GFR -     TSH  OSA on CPAP Weight loss advised, continue CPAP  Mixed hyperlipidemia -continue medications, check lipids, decrease fatty foods, increase activity.  -     Lipid panel  Abnormal glucose Discussed general issues about diabetes pathophysiology and management., Educational material distributed., Suggested low cholesterol diet., Encouraged aerobic exercise., Discussed foot care., Reminded to get yearly retinal exam.  Medication management -     Magnesium  Gastroesophageal reflux disease, esophagitis presence not specified Continue PPI/H2 blocker, diet discussed  Vitamin D deficiency Continue supplement  Obesity (BMI 30-34.9) - long discussion about weight loss, diet, and exercise He declines following up 1 month for phentermine follow up. If not losing weight at 3 month, stop the medication.   Former smoker monitor   Over 30 minutes of exam, counseling, chart review, and critical decision making was performed  Future Appointments  Date Time Provider [place]  03/04/2019 11:15 AM Vicie Mutters, PA-C [facility] None  08/18/2019  2:00 PM [name], MD [facility] None     Plan:   During the course of the visit the patient was educated and counseled about appropriate screening and preventive services including:    Pneumococcal vaccine   Influenza vaccine  Prevnar 13  Td vaccine  Screening electrocardiogram  Colorectal cancer screening  Diabetes screening  Glaucoma screening  Nutrition counseling    Subjective:  Jason Dennis is a 68 y.o. male who presents for Medicare Annual Wellness Visit and 3 month follow up for HTN, hyperlipidemia, prediabetes,  and vitamin D Def.  Keeps his granddaughter isabell, she is 73-30 year olds  He reports chronic right knee pain, previously getting injections by ortho, requested referral back.   BMI is Body mass index is 30.56 kg/m., he is not currently working on diet and exercise.  He is on CPAP for OSA. He is on phentermine, stopped and it and just restarted it. He declines following up 1 month for phentermine follow up. If not losing weight at 3 month, stop the medication.  Wt Readings from Last 3 Encounters:  11/20/18 213 lb (96.6 kg)  08/13/18 201 lb 12.8 oz (91.5 kg)  05/12/18 226 lb 3.2 oz (102.6 kg)    His blood pressure has been controlled at home, today their BP is BP: 136/70   He does workout. He denies chest pain, shortness of breath, dizziness.   He is on cholesterol medication and denies myalgias. His cholesterol is at goal. The cholesterol last visit was:   Lab Results  Component Value Date   CHOL 158 08/13/2018   HDL 51 08/13/2018   LDLCALC 88 08/13/2018   TRIG 92 08/13/2018   CHOLHDL 3.1 08/13/2018   He does have diet controlled prediabetes.  He reports that he is doing well with diet and exercise.   Lab Results  Component Value Date   HGBA1C 5.2 08/13/2018    Last GFR Lab Results  Component Value Date   GFRNONAA 89 08/13/2018    Patient is on Vitamin D supplement.   Lab Results  Component Value Date   VD25OH 64 08/13/2018       Medication Review: Current Outpatient Medications on File Prior to Visit  Medication  Sig Dispense Refill  . atorvastatin (LIPITOR) 80 MG tablet Take 1 tablet Daily for Cholesterol 90 tablet 3  . cholecalciferol (VITAMIN D) 1000 UNITS tablet Take 5,000 Units by mouth daily.     . cyclobenzaprine (FLEXERIL) 10 MG tablet Take 1/2 to 1 tablet 3 x /day as needed for Muscle Spasm 30 tablet 0  . meclizine (ANTIVERT) 25 MG tablet Take 1 tablet 3 x /day if needed for Dizziness / Vertigo 270 tablet 1  . meloxicam (MOBIC) 15 MG tablet Take 1/2 to 1  tablet Daily with Food for Pain & Inflammation - limit to 4 days /week to avoid Kidney Damage 90 tablet 0  . Omega-3 Fatty Acids (FISH OIL) 1000 MG CAPS Take by mouth.    Marland Kitchen omeprazole (PRILOSEC) 20 MG capsule Take 20 mg by mouth daily.    . phentermine (ADIPEX-P) 37.5 MG tablet Take 1/2 to 1 tablet every morning for Dieting & Weight Loss 90 tablet 0  . sildenafil (VIAGRA) 100 MG tablet TAKE 1/2 TO 1 TABLET BY MOUTH DAILY AS NEEDED 30 tablet 11   No current facility-administered medications on file prior to visit.     Allergies: No Known Allergies  Current Problems (verified) has Hyperlipidemia, mixed; Hypertension; Abnormal glucose; Vitamin D deficiency; Obesity (BMI 30.0-34.9); OSA on CPAP; GERD (gastroesophageal reflux disease); Medication management; Tear of left supraspinatus tendon; Impingement syndrome of left shoulder; Tendinopathy of left biceps; FHx: heart disease; and Former smoker on their problem list.  Screening Tests Immunization History  Administered Date(s) Administered  . Influenza, High Dose Seasonal PF 10/21/2015, 12/24/2016, 01/10/2018, 11/20/2018  . PPD Test 11/10/2013, 11/11/2014  . Pneumococcal Conjugate-13 10/21/2015  . Pneumococcal Polysaccharide-23 05/31/2016  . Pneumococcal-Unspecified 04/15/2009  . Td 11/17/2004  . Tdap 11/11/2014  . Zoster 10/28/2012    Preventative care: Last colonoscopy: 04/2016 due 2023 CXR 2008  Prior vaccinations: TD or Tdap: 2016  Influenza: TODAY Pneumococcal: 2018 Prevnar13: 2017 Shingles/Zostavax: 2014  Names of Other Physician/Practitioners you currently use: 1. [facility] here for primary care 2. Dr. Delman Cheadle, eye doctor, last visit 2019 3. Dr. Aggie Moats, dentist, last visit 2019, q59m  Patient Care Team: [name], MD as PCP - General  Surgical: He  has a past surgical history that includes Spine surgery. Family His family history includes COPD in his mother; Colon cancer  in his father; Diabetes in his brother; Heart disease in his mother; Heart failure in his father and mother; Hypertension in his mother. Social history  He reports that he quit smoking about 28 years ago. His smoking use included cigarettes. He has a 32.00 pack-year smoking history. He has never used smokeless tobacco. He reports current alcohol use of about 22.0 standard drinks of alcohol per week. He reports that he does not use drugs.  MEDICARE WELLNESS OBJECTIVES: Physical activity:   Cardiac risk factors:   Depression/mood screen:   Depression screen [facility] 2/9 08/12/2018  Decreased Interest 0  Down, Depressed, Hopeless 0  PHQ - 2 Score 0    ADLs:  In your present state of health, do you have any difficulty performing the following activities: 08/12/2018 02/28/2018  Hearing? N N  Vision? - N  Difficulty concentrating or making decisions? N N  Walking or climbing stairs? N N  Dressing or bathing? N N  Doing errands, shopping? N N  Some recent data might be hidden    Cognitive Testing  Alert? Yes  Normal Appearance?Yes  Oriented to person? Yes  Place?  Yes   Time? Yes  Recall of three objects?  Yes  Can perform simple calculations? Yes  Displays appropriate judgment?Yes  Can read the correct time from a watch face?Yes  EOL planning: Does Patient Have a Medical Advance Directive?: Yes Type of Advance Directive: Healthcare Power of Attorney, Living will Copy of [place] in Chart?: No - copy requested   Objective:   Today's Vitals   11/20/18 0836  BP: 136/70  Pulse: 78  Temp: 97.7 F (36.5 C)  SpO2: 99%  Weight: 213 lb (96.6 kg)  Height: 5\' 10"  (1.778 m)  PainSc: 3   PainLoc: Generalized   Body mass index is 30.56 kg/m.  General appearance: alert, no distress, WD/WN, male HEENT: normocephalic, sclerae anicteric, TMs pearly, nares patent, no discharge or erythema, pharynx normal Oral cavity: MMM, no lesions Neck: supple, no lymphadenopathy, no  thyromegaly, no masses Heart: RRR, normal S1, S2, no murmurs Lungs: CTA bilaterally, no wheezes, rhonchi, or rales Abdomen: +bs, soft, non tender, non distended, no masses, no hepatomegaly, no splenomegaly Musculoskeletal: nontender, no swelling, no obvious deformity Extremities: no edema, no cyanosis, no clubbing Pulses: 2+ symmetric, upper and lower extremities, normal cap refill Neurological: alert, oriented x 3, CN2-12 intact, strength normal upper extremities and lower extremities, sensation normal throughout, DTRs 2+ throughout, no cerebellar signs, gait normal Psychiatric: normal affect, behavior normal, pleasant   Medicare Attestation I have personally reviewed: The patient's medical and social history Their use of alcohol, tobacco or illicit drugs Their current medications and supplements The patient's functional ability including ADLs,fall risks, home safety risks, cognitive, and hearing and visual impairment Diet and physical activities Evidence for depression or mood disorders  The patient's weight, height, BMI, and visual acuity have been recorded in the chart.  I have made referrals, counseling, and provided education to the patient based on review of the above and I have provided the patient with a written personalized care plan for preventive services.     Vicie Mutters, PA-C   11/20/2018

## 2018-11-20 ENCOUNTER — Other Ambulatory Visit: Payer: Self-pay

## 2018-11-20 ENCOUNTER — Ambulatory Visit (INDEPENDENT_AMBULATORY_CARE_PROVIDER_SITE_OTHER): Payer: Medicare HMO | Admitting: Physician Assistant

## 2018-11-20 ENCOUNTER — Encounter: Payer: Self-pay | Admitting: Physician Assistant

## 2018-11-20 VITALS — BP 136/70 | HR 78 | Temp 97.7°F | Ht 70.0 in | Wt 213.0 lb

## 2018-11-20 DIAGNOSIS — Z79899 Other long term (current) drug therapy: Secondary | ICD-10-CM | POA: Diagnosis not present

## 2018-11-20 DIAGNOSIS — R7309 Other abnormal glucose: Secondary | ICD-10-CM

## 2018-11-20 DIAGNOSIS — Z0001 Encounter for general adult medical examination with abnormal findings: Secondary | ICD-10-CM | POA: Diagnosis not present

## 2018-11-20 DIAGNOSIS — Z0000 Encounter for general adult medical examination without abnormal findings: Secondary | ICD-10-CM

## 2018-11-20 DIAGNOSIS — E559 Vitamin D deficiency, unspecified: Secondary | ICD-10-CM | POA: Diagnosis not present

## 2018-11-20 DIAGNOSIS — Z87891 Personal history of nicotine dependence: Secondary | ICD-10-CM

## 2018-11-20 DIAGNOSIS — R6889 Other general symptoms and signs: Secondary | ICD-10-CM | POA: Diagnosis not present

## 2018-11-20 DIAGNOSIS — K219 Gastro-esophageal reflux disease without esophagitis: Secondary | ICD-10-CM | POA: Diagnosis not present

## 2018-11-20 DIAGNOSIS — Z23 Encounter for immunization: Secondary | ICD-10-CM | POA: Diagnosis not present

## 2018-11-20 DIAGNOSIS — G4733 Obstructive sleep apnea (adult) (pediatric): Secondary | ICD-10-CM | POA: Diagnosis not present

## 2018-11-20 DIAGNOSIS — Z8249 Family history of ischemic heart disease and other diseases of the circulatory system: Secondary | ICD-10-CM | POA: Diagnosis not present

## 2018-11-20 DIAGNOSIS — E782 Mixed hyperlipidemia: Secondary | ICD-10-CM | POA: Diagnosis not present

## 2018-11-20 DIAGNOSIS — I10 Essential (primary) hypertension: Secondary | ICD-10-CM

## 2018-11-20 DIAGNOSIS — E669 Obesity, unspecified: Secondary | ICD-10-CM

## 2018-11-20 DIAGNOSIS — Z9989 Dependence on other enabling machines and devices: Secondary | ICD-10-CM

## 2018-11-21 LAB — COMPLETE METABOLIC PANEL WITH GFR
AG Ratio: 2.5 (calc) (ref 1.0–2.5)
ALT: 34 U/L (ref 9–46)
AST: 25 U/L (ref 10–35)
Albumin: 4.3 g/dL (ref 3.6–5.1)
Alkaline phosphatase (APISO): 105 U/L (ref 35–144)
BUN: 21 mg/dL (ref 7–25)
CO2: 24 mmol/L (ref 20–32)
Calcium: 9.4 mg/dL (ref 8.6–10.3)
Chloride: 103 mmol/L (ref 98–110)
Creat: 0.81 mg/dL (ref 0.70–1.25)
GFR, Est African American: 106 mL/min/{1.73_m2} (ref >=60)
GFR, Est Non African American: 91 mL/min/{1.73_m2} (ref >=60)
Globulin: 1.7 g/dL (calc) — ABNORMAL LOW (ref 1.9–3.7)
Glucose, Bld: 99 mg/dL (ref 65–99)
Potassium: 4.2 mmol/L (ref 3.5–5.3)
Sodium: 137 mmol/L (ref 135–146)
Total Bilirubin: 0.7 mg/dL (ref 0.2–1.2)
Total Protein: 6 g/dL — ABNORMAL LOW (ref 6.1–8.1)

## 2018-11-21 LAB — LIPID PANEL
Cholesterol: 145 mg/dL (ref <200)
HDL: 55 mg/dL (ref >=40)
LDL Cholesterol (Calc): 67 mg/dL (calc)
Non-HDL Cholesterol (Calc): 90 mg/dL (calc) (ref <130)
Total CHOL/HDL Ratio: 2.6 (calc) (ref <5.0)
Triglycerides: 152 mg/dL — ABNORMAL HIGH (ref <150)

## 2018-11-21 LAB — CBC WITH DIFFERENTIAL/PLATELET
Absolute Monocytes: 396 cells/uL (ref 200–950)
Basophils Absolute: 39 cells/uL (ref 0–200)
Basophils Relative: 0.9 %
Eosinophils Absolute: 323 cells/uL (ref 15–500)
Eosinophils Relative: 7.5 %
HCT: 39 % (ref 38.5–50.0)
Hemoglobin: 13.3 g/dL (ref 13.2–17.1)
Lymphs Abs: 800 cells/uL — ABNORMAL LOW (ref 850–3900)
MCH: 30.9 pg (ref 27.0–33.0)
MCHC: 34.1 g/dL (ref 32.0–36.0)
MCV: 90.7 fL (ref 80.0–100.0)
MPV: 10.2 fL (ref 7.5–12.5)
Monocytes Relative: 9.2 %
Neutro Abs: 2743 cells/uL (ref 1500–7800)
Neutrophils Relative %: 63.8 %
Platelets: 213 10*3/uL (ref 140–400)
RBC: 4.3 10*6/uL (ref 4.20–5.80)
RDW: 12.3 % (ref 11.0–15.0)
Total Lymphocyte: 18.6 %
WBC: 4.3 10*3/uL (ref 3.8–10.8)

## 2018-11-21 LAB — HEMOGLOBIN A1C
Hgb A1c MFr Bld: 5.2 % of total Hgb (ref <5.7)
Mean Plasma Glucose: 103 (calc)
eAG (mmol/L): 5.7 (calc)

## 2018-11-21 LAB — MAGNESIUM: Magnesium: 1.7 mg/dL (ref 1.5–2.5)

## 2018-11-21 LAB — VITAMIN D 25 HYDROXY (VIT D DEFICIENCY, FRACTURES): Vit D, 25-Hydroxy: 45 ng/mL (ref 30–100)

## 2018-11-21 LAB — TSH: TSH: 1.78 mIU/L (ref 0.40–4.50)

## 2018-11-27 DIAGNOSIS — G4733 Obstructive sleep apnea (adult) (pediatric): Secondary | ICD-10-CM | POA: Diagnosis not present

## 2018-12-22 ENCOUNTER — Other Ambulatory Visit: Payer: Self-pay | Admitting: Internal Medicine

## 2018-12-22 DIAGNOSIS — E669 Obesity, unspecified: Secondary | ICD-10-CM

## 2018-12-27 DIAGNOSIS — G4733 Obstructive sleep apnea (adult) (pediatric): Secondary | ICD-10-CM | POA: Diagnosis not present

## 2019-01-27 DIAGNOSIS — G4733 Obstructive sleep apnea (adult) (pediatric): Secondary | ICD-10-CM | POA: Diagnosis not present

## 2019-02-03 ENCOUNTER — Other Ambulatory Visit: Payer: Self-pay | Admitting: Physician Assistant

## 2019-02-03 DIAGNOSIS — E669 Obesity, unspecified: Secondary | ICD-10-CM

## 2019-03-01 IMAGING — MR MR SHOULDER*L* W/O CM
4 of 5 series · 21 of 40 positions shown · non-contrast
Comparison: None.

CLINICAL DATA: Left shoulder pain.  Anterior pain.

EXAM:
MRI OF THE LEFT SHOULDER WITHOUT CONTRAST
TECHNIQUE: Multiplanar, multisequence MR imaging of the shoulder was performed.
No intravenous contrast was administered.

[Series 6: PD fat-sat · axial · left · 4.0mm · 0.44mm/px · z∈[-65,+38]mm · 9 of 24 slices shown (1 of 2)]
[im 1/24]
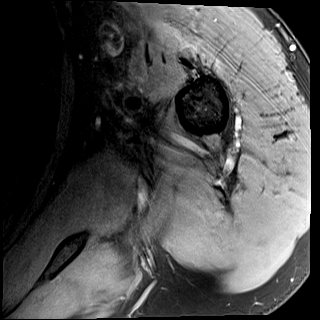
[im 3/24]
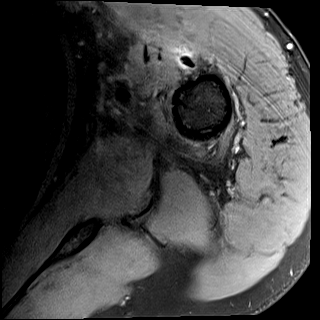
[im 6/24]
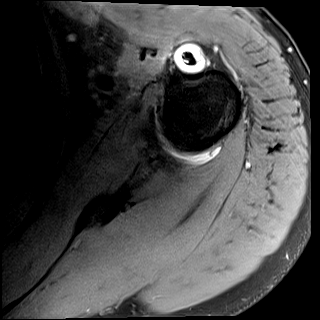
[im 9/24]
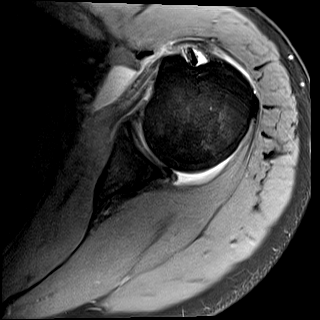
[im 12/24]
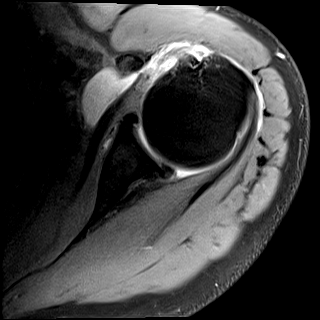
[im 15/24]
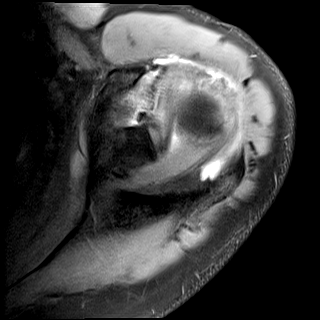
[im 18/24]
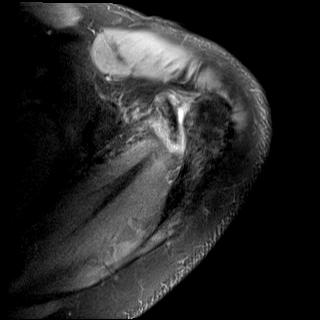
[im 21/24]
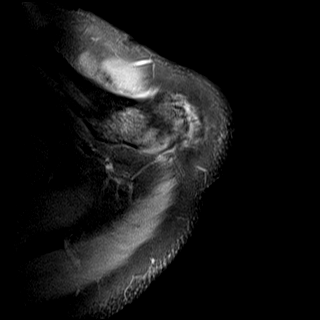
[im 24/24]
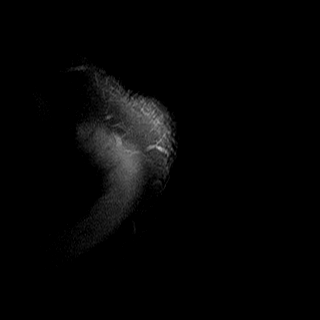

[Series 7: T2 fat-sat · oblique · left · 4.0mm · 0.22mm/px · 3 of 23 slices shown (1 of 2)]
[im 4/23]
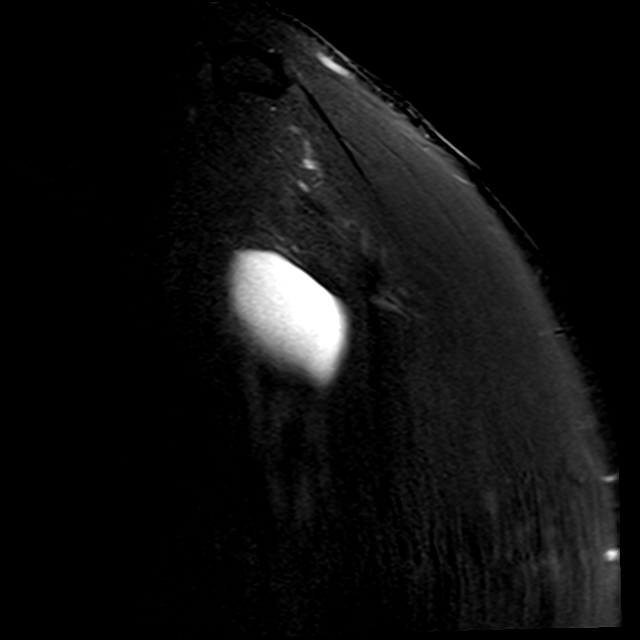
[im 13/23]
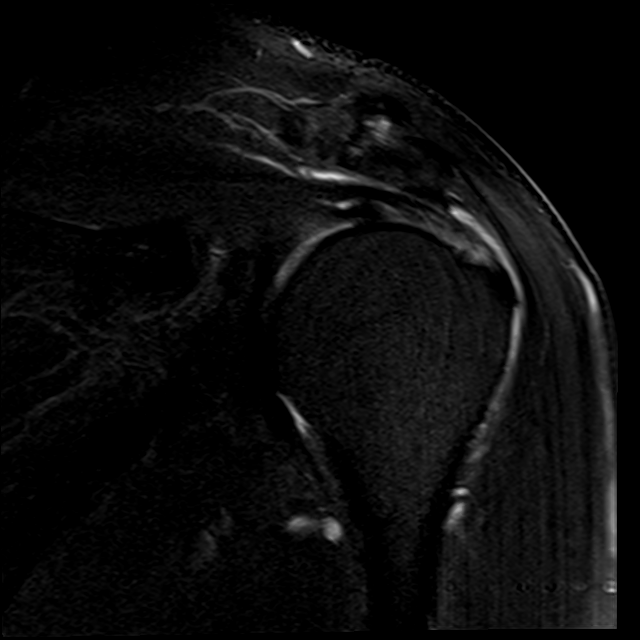
[im 19/23]
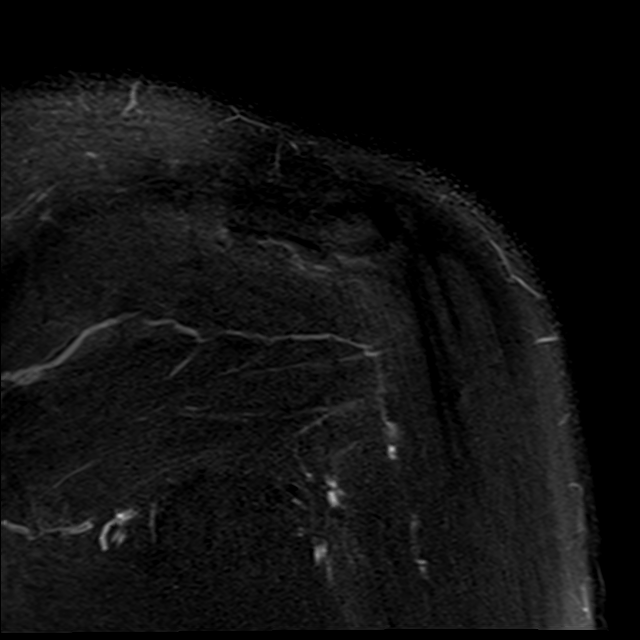

[Series 8: PD fat-sat · oblique · left · 4.0mm · 0.22mm/px · 6 of 21 slices shown (2 of 2)]
[im 1/21]
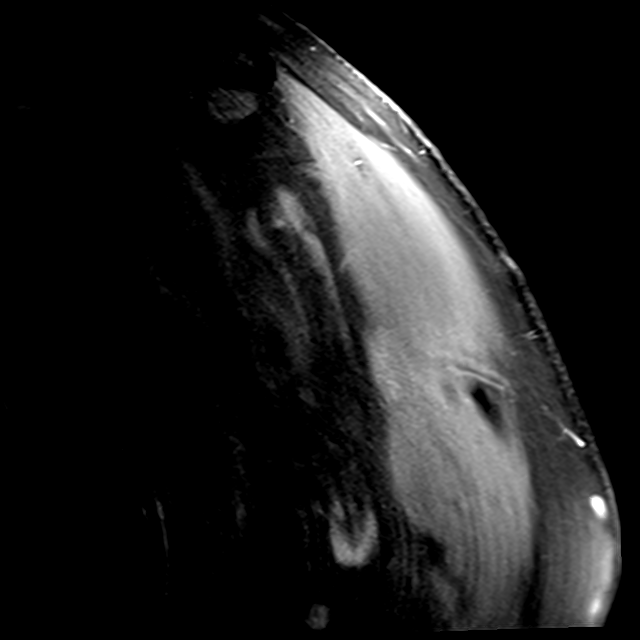
[im 4/21]
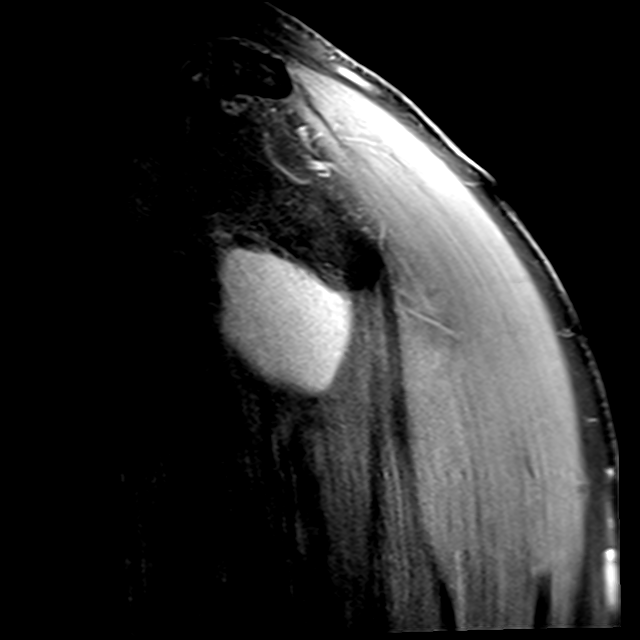
[im 7/21]
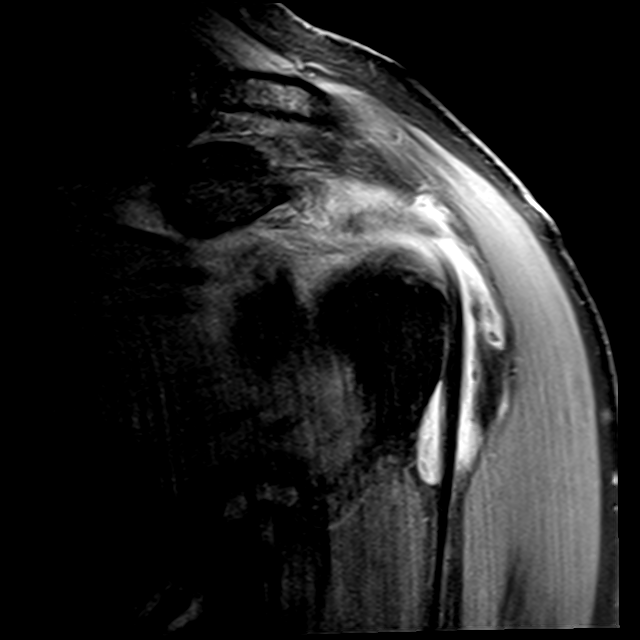
[im 11/21]
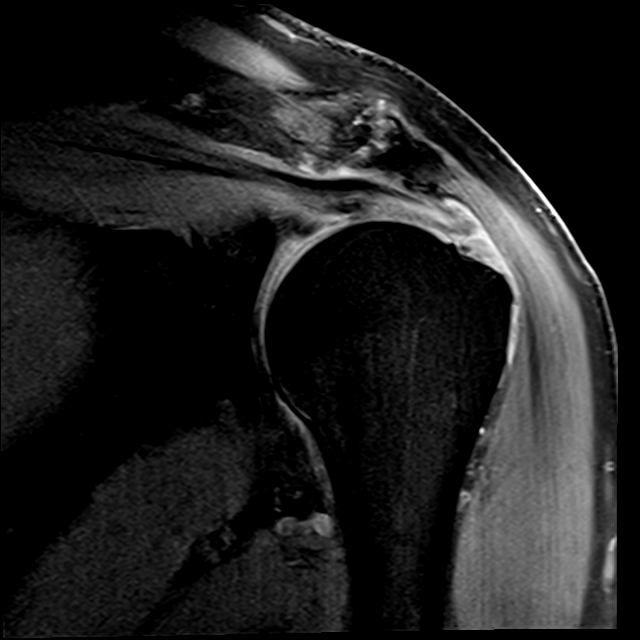
[im 14/21]
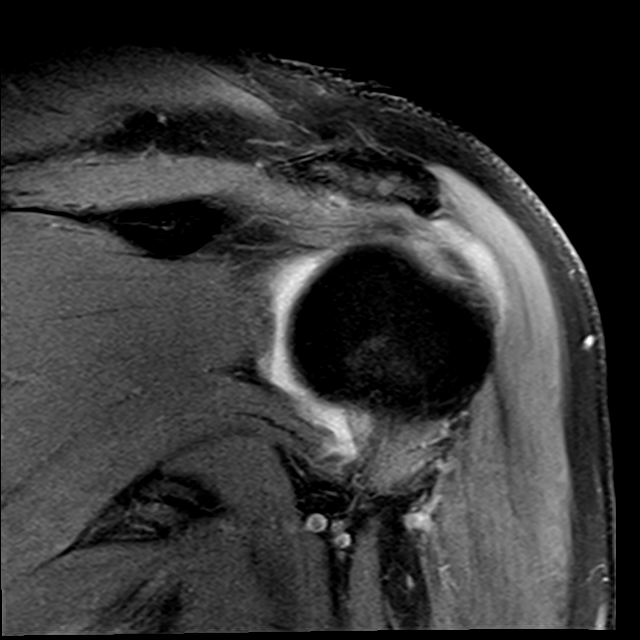
[im 17/21]
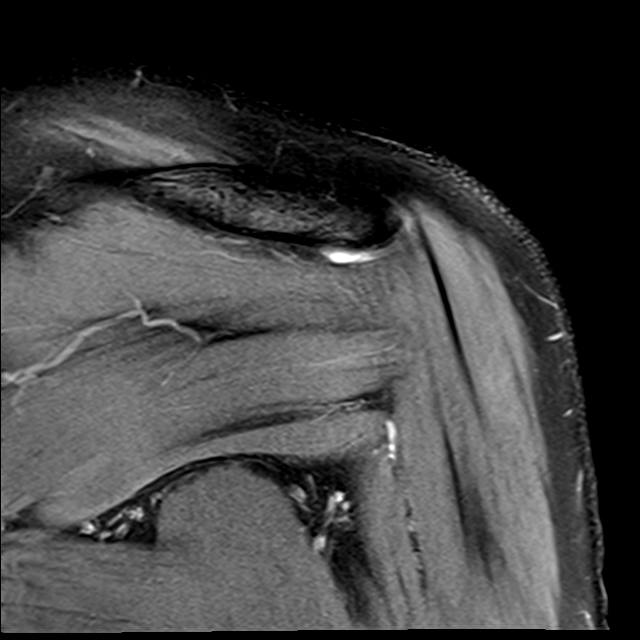

[Series 9: T2 fat-sat · oblique · left · 4.0mm · 0.44mm/px · 3 of 23 slices shown (2 of 2)]
[im 4/23]
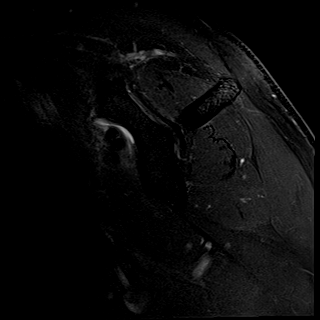
[im 13/23]
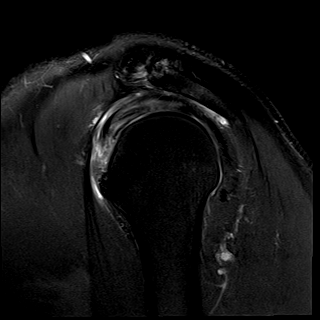
[im 19/23]
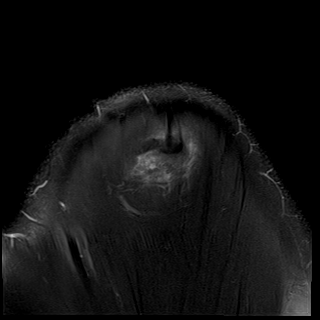

[21 of 40 positions shown; findings below may reference images not displayed]

FINDINGS: Rotator cuff: Severe tendinosis of the supraspinatus tendon with a
complete tear and 9 mm of retraction.. Mild tendinosis of the
infraspinatus tendon. Teres minor tendon is intact. Severe
tendinosis of the subscapularis tendon with a partial-thickness
tear.

Muscles: No atrophy or fatty replacement of nor abnormal signal
within, the muscles of the rotator cuff.

Biceps long head: Severe tendinosis of the intra-articular portion
of the long head of the biceps tendon.

Acromioclavicular Joint: Moderate arthropathy of the
acromioclavicular joint. Type I acromion. Small amount of
subacromial/subdeltoid bursal fluid.

Glenohumeral Joint: No joint effusion. No chondral defect.

Labrum: Grossly intact, but evaluation is limited by lack of
intraarticular fluid.

Bones:  No marrow abnormality, fracture or dislocation.

Other: No fluid collection or hematoma.
IMPRESSION: 1. Severe tendinosis of the supraspinatus tendon with a complete
tear and 9 mm of retraction.
2. Mild tendinosis of the infraspinatus tendon.
3. Severe tendinosis of the subscapularis tendon with a
partial-thickness tear.
4. Severe tendinosis of the intra-articular portion of the long head
of the biceps tendon.

## 2019-03-02 NOTE — Progress Notes (Signed)
FOLLOW UP  Assessment and Plan:  Essential hypertension - continue medications, DASH diet, exercise and monitor at home. Call if greater than 130/80.  -     COMPLETE METABOLIC PANEL WITH GFR -     CBC with Diff -     TSH  Hyperlipidemia, mixed -     Lipid Profile check lipids decrease fatty foods increase activity.   Medication management -     Magnesium  Obesity (BMI 30.0-34.9) Stop the phentermine for now - long discussion about weight loss, diet, and exercise -recommended diet heavy in fruits and veggies and low in animal meats, cheeses, and dairy products  Vitamin D deficiency -     Vitamin D (25 hydroxy)  Chronic pain of both knees Follow up ortho, declines voltern gel but given information.    Continue diet and meds as discussed. Further disposition pending results of labs. Over 30 minutes of exam, counseling, chart review, and critical decision making was performed  Future Appointments  Date Time Provider [place]  08/18/2019  2:00 PM [name], MD [facility] None     HPI 68 y.o. male  presents for 3 month follow up on hypertension, cholesterol, prediabetes, and vitamin D deficiency.   His blood pressure has been controlled at home, today their BP is BP: 124/62   He does workout, walks [place] [employer]. He denies chest pain, shortness of breath, dizziness. Wife does meds, teresa, so he does not what he is on,   BMI is Body mass index is 30.99 kg/m., he is working on diet and exercise.  He is on CPAP for OSA. He was started on phentermine 3 months ago for weight loss., he did not lose any weight.  Wt Readings from Last 3 Encounters:  03/04/19 216 lb (98 kg)  11/20/18 213 lb (96.6 kg)  08/13/18 201 lb 12.8 oz (91.5 kg)    He  is  on cholesterol medication, 80mg  daily and denies myalgias. His cholesterol is at goal. The cholesterol last visit was:   Lab Results  Component Value Date   CHOL 145 11/20/2018   HDL 55  11/20/2018   LDLCALC 67 11/20/2018   TRIG 152 (H) 11/20/2018   CHOLHDL 2.6 11/20/2018    He has been working on diet and exercise for prediabetes, and denies paresthesia of the feet, polydipsia and polyuria. Last A1C in the office was:  Lab Results  Component Value Date   HGBA1C 5.2 11/20/2018   Patient is on Vitamin D supplement.   Lab Results  Component Value Date   VD25OH 45 11/20/2018      Current Medications:    Current Outpatient Medications (Cardiovascular):  .  atorvastatin (LIPITOR) 80 MG tablet, Take 1 tablet Daily for Cholesterol .  sildenafil (VIAGRA) 100 MG tablet, TAKE 1/2 TO 1 TABLET BY MOUTH DAILY AS NEEDED   Current Outpatient Medications (Analgesics):  .  meloxicam (MOBIC) 15 MG tablet, Take 1/2 to 1 tablet Daily with Food for Pain & Inflammation - limit to 4 days /week to avoid Kidney Damage   Current Outpatient Medications (Other):  .  cholecalciferol (VITAMIN D) 1000 UNITS tablet, Take 5,000 Units by mouth daily.  .  cyclobenzaprine (FLEXERIL) 10 MG tablet, Take 1/2 to 1 tablet 3 x /day as needed for Muscle Spasm .  meclizine (ANTIVERT) 25 MG tablet, Take 1 tablet 3 x /day if needed for Dizziness / Vertigo .  Omega-3 Fatty Acids (FISH OIL) 1000 MG CAPS, Take by mouth. Marland Kitchen  omeprazole (PRILOSEC) 20 MG capsule, Take 20 mg by mouth daily. .  phentermine (ADIPEX-P) 37.5 MG tablet, Take 1/2 to 1 tablet every morning for Dieting & Weight Loss  Medical History:  Past Medical History:  Diagnosis Date  . GERD (gastroesophageal reflux disease)   . Hyperlipidemia   . Obesity   . Prediabetes   . Sleep apnea    CPAP  . Vitamin D deficiency    Allergies: No Known Allergies   Review of Systems:  Review of Systems  Constitutional: Negative.   HENT: Negative.   Eyes: Negative.   Respiratory: Negative.   Cardiovascular: Negative.   Gastrointestinal: Negative.   Genitourinary: Negative.   Musculoskeletal: Positive for joint pain.  Skin: Negative.      Family history- Review and unchanged Social history- Review and unchanged Physical Exam: BP 124/62   Pulse 90   Temp 97.9 F (36.6 C)   Resp 14   Ht 5\' 10"  (1.778 m)   Wt 216 lb (98 kg)   SpO2 97%   BMI 30.99 kg/m  Wt Readings from Last 3 Encounters:  03/04/19 216 lb (98 kg)  11/20/18 213 lb (96.6 kg)  08/13/18 201 lb 12.8 oz (91.5 kg)   General Appearance: Well nourished, in no apparent distress. Eyes: PERRLA, EOMs, conjunctiva no swelling or erythema Sinuses: No Frontal/maxillary tenderness ENT/Mouth: Ext aud canals clear, TMs without erythema, bulging. No erythema, swelling, or exudate on post pharynx.  Tonsils not swollen or erythematous. Hearing normal.  Neck: Supple, thyroid normal.  Respiratory: Respiratory effort normal, BS equal bilaterally without rales, rhonchi, wheezing or stridor.  Cardio: RRR with no MRGs. Brisk peripheral pulses without edema.  Abdomen: Soft, + BS,  Non tender, no guarding, rebound, hernias, masses. Lymphatics: Non tender without lymphadenopathy.  Musculoskeletal: Full ROM, 5/5 strength, Normal gait Skin: Warm, dry without rashes, lesions, ecchymosis.  Neuro: Cranial nerves intact. Normal muscle tone, no cerebellar symptoms. Psych: Awake and oriented X 3, normal affect, Insight and Judgment appropriate.    Vicie Mutters, PA-C 11:45 AM [facility]

## 2019-03-04 ENCOUNTER — Other Ambulatory Visit: Payer: Self-pay

## 2019-03-04 ENCOUNTER — Ambulatory Visit (INDEPENDENT_AMBULATORY_CARE_PROVIDER_SITE_OTHER): Payer: Medicare HMO | Admitting: Physician Assistant

## 2019-03-04 VITALS — BP 124/62 | HR 90 | Temp 97.9°F | Resp 14 | Ht 70.0 in | Wt 216.0 lb

## 2019-03-04 DIAGNOSIS — M25562 Pain in left knee: Secondary | ICD-10-CM | POA: Diagnosis not present

## 2019-03-04 DIAGNOSIS — M25561 Pain in right knee: Secondary | ICD-10-CM | POA: Diagnosis not present

## 2019-03-04 DIAGNOSIS — E782 Mixed hyperlipidemia: Secondary | ICD-10-CM | POA: Diagnosis not present

## 2019-03-04 DIAGNOSIS — E559 Vitamin D deficiency, unspecified: Secondary | ICD-10-CM | POA: Diagnosis not present

## 2019-03-04 DIAGNOSIS — I10 Essential (primary) hypertension: Secondary | ICD-10-CM | POA: Diagnosis not present

## 2019-03-04 DIAGNOSIS — G8929 Other chronic pain: Secondary | ICD-10-CM

## 2019-03-04 DIAGNOSIS — Z79899 Other long term (current) drug therapy: Secondary | ICD-10-CM | POA: Diagnosis not present

## 2019-03-04 DIAGNOSIS — E669 Obesity, unspecified: Secondary | ICD-10-CM

## 2019-03-04 NOTE — Patient Instructions (Addendum)
Can do voltern gel for your knees Can try papa and barkely CBD oil topically.    If I told you I had a single pill that would help you with everything listed below and more, would you be interested? . decrease stress by improving anxiety and depression . help you achieve a healthy weight . give you more energy . make you more productive  . help you focus . decrease your risk of dementia/heart attack/stroke/falls . improve your bone health  These are just some of the benefits that exercise brings to you.   IT IS WORTH carving out some time every day to fit in exercise. It will help in every aspect of your health. Even if you have injuries that prevent you from participating in a type of exercise you used to do; there is always something that you can do to keep exercise a part of your life. If improving your health is important, make exercise your priority. It is worth the time! If you have questions about the type of exercise that is right for you, please talk with me about this!  EXERCISE IS MEDICINE!  Benefits of Exercise  Reduces breast cancer onset and recurrence by 50% Lowers risk of colon cancer by 66% Reduces the risk of Alzheimer's by almost 50% Reduces heart disease and high blood pressure by almost 50% Lowers risk of stroke by 33% Lowers risk of Type II Diabetes Mellitus by over 60% Treats depression as well as medication or cognitive behavioral therapy       Exercising to Stay Healthy  Exercising regularly is important. It has many health benefits, such as:  Improving your overall fitness, flexibility, and endurance.  Increasing your bone density.  Helping with weight control.  Decreasing your body fat.  Increasing your muscle strength.  Reducing stress and tension.  Improving your overall health.   In order to become healthy and stay healthy, it is recommended that you do moderate-intensity and vigorous-intensity exercise. You can tell that you are  exercising at a moderate intensity if you have a higher heart rate and faster breathing, but you are still able to hold a conversation. You can tell that you are exercising at a vigorous intensity if you are breathing much harder and faster and cannot hold a conversation while exercising. How often should I exercise? Choose an activity that you enjoy and set realistic goals. Your health care provider can help you to make an activity plan that works for you. Exercise regularly as directed by your health care provider. This may include:  Doing resistance training twice each week, such as: ? Push-ups. ? Sit-ups. ? Lifting weights. ? Using resistance bands.  Doing a given intensity of exercise for a given amount of time. Choose from these options: ? 150 minutes of moderate-intensity exercise every week. ? 75 minutes of vigorous-intensity exercise every week. ? A mix of moderate-intensity and vigorous-intensity exercise every week.   Children, pregnant women, people who are out of shape, people who are overweight, and older adults may need to consult a health care provider for individual recommendations. If you have any sort of medical condition, be sure to consult your health care provider before starting a new exercise program. What are some exercise ideas? Some moderate-intensity exercise ideas include:  Walking at a rate of 1 mile in 15 minutes.  Biking.  Hiking.  Golfing.  Dancing.   Some vigorous-intensity exercise ideas include:  Walking at a rate of at least 4.5 miles per hour.  Jogging or running at a rate of 5 miles per hour.  Biking at a rate of at least 10 miles per hour.  Lap swimming.  Roller-skating or in-line skating.  Cross-country skiing.  Vigorous competitive sports, such as football, basketball, and soccer.  Jumping rope.  Aerobic dancing.   What are some everyday activities that can help me to get exercise?  [place] work, such as: ? Pushing a Cabin crew. ? Raking and bagging leaves.  Washing and waxing your car.  Pushing a stroller.  Shoveling snow.  Gardening.  Washing windows or floors. How can I be more active in my day-to-day activities?  Use the stairs instead of the elevator.  Take a walk during your lunch break.  If you drive, park your car farther away from work or school.  If you take public transportation, get off one stop early and walk the rest of the way.  Make all of your phone calls while standing up and walking around.  Get up, stretch, and walk around every 30 minutes throughout the day. What guidelines should I follow while exercising?  Do not exercise so much that you hurt yourself, feel dizzy, or get very short of breath.  Consult your health care provider before starting a new exercise program.  Wear comfortable clothes and shoes with good support.  Drink plenty of water while you exercise to prevent dehydration or heat stroke. Body water is lost during exercise and must be replaced.  Work out until you breathe faster and your heart beats faster. This information is not intended to replace advice given to you by your health care provider. Make sure you discuss any questions you have with your health care provider.   General eating tips  What to Avoid . Avoid added sugars o Often added sugar can be found in processed foods such as many condiments, dry cereals, cakes, cookies, chips, crisps, crackers, candies, sweetened drinks, etc.  o Read labels and AVOID/DECREASE use of foods with the following in their ingredient list: Sugar, fructose, high fructose corn syrup, sucrose, glucose, maltose, dextrose, molasses, cane sugar, brown sugar, any type of syrup, agave nectar, etc.   . Avoid snacking in between meals- drink water or if you feel you need a snack, pick a high water content snack such as cucumbers, watermelon, or any veggie.  Marland Kitchen Avoid foods made with flour o If you are going to eat food  made with flour, choose those made with whole-grains; and, minimize your consumption as much as is tolerable . Avoid processed foods o These foods are generally stocked in the middle of the grocery store.  o Focus on shopping on the perimeter of the grocery.  What to Include . Vegetables o GREEN LEAFY VEGETABLES: Kale, spinach, mustard greens, collard greens, cabbage, broccoli, etc. o OTHER: Asparagus, cauliflower, eggplant, carrots, peas, Brussel sprouts, tomatoes, bell peppers, zucchini, beets, cucumbers, etc. . Grains, seeds, and legumes o Beans: kidney beans, black eyed peas, garbanzo beans, black beans, pinto beans, etc. o Whole, unrefined grains: brown rice, barley, bulgur, oatmeal, etc. . Healthy fats  o Avoid highly processed fats such as vegetable oil o Examples of healthy fats: avocado, olives, virgin olive oil, dark chocolate (?72% Cocoa), nuts (peanuts, almonds, walnuts, cashews, pecans, etc.) o Please still do small amount of these healthy fats, they are dense in calories.  . Low - Moderate Intake of Animal Sources of Protein o Meat sources: chicken, [place], salmon, tuna. Limit to 4 ounces of meat at  one time or the size of your palm. o Consider limiting dairy sources, but when choosing dairy focus on: PLAIN [place] yogurt, cottage cheese, high-protein milk . Fruit o Choose berries

## 2019-03-05 DIAGNOSIS — G4733 Obstructive sleep apnea (adult) (pediatric): Secondary | ICD-10-CM | POA: Diagnosis not present

## 2019-03-05 LAB — LIPID PANEL
Cholesterol: 167 mg/dL
HDL: 50 mg/dL
LDL Cholesterol (Calc): 89 mg/dL
Non-HDL Cholesterol (Calc): 117 mg/dL
Total CHOL/HDL Ratio: 3.3 (calc)
Triglycerides: 185 mg/dL — ABNORMAL HIGH

## 2019-03-05 LAB — COMPLETE METABOLIC PANEL WITHOUT GFR
AG Ratio: 2.4 (calc) (ref 1.0–2.5)
ALT: 51 U/L — ABNORMAL HIGH (ref 9–46)
AST: 30 U/L (ref 10–35)
Albumin: 4.6 g/dL (ref 3.6–5.1)
Alkaline phosphatase (APISO): 103 U/L (ref 35–144)
BUN: 19 mg/dL (ref 7–25)
CO2: 24 mmol/L (ref 20–32)
Calcium: 9.6 mg/dL (ref 8.6–10.3)
Chloride: 105 mmol/L (ref 98–110)
Creat: 0.82 mg/dL (ref 0.70–1.25)
GFR, Est African American: 105 mL/min/1.73m2
GFR, Est Non African American: 91 mL/min/1.73m2
Globulin: 1.9 g/dL (ref 1.9–3.7)
Glucose, Bld: 94 mg/dL (ref 65–99)
Potassium: 4.3 mmol/L (ref 3.5–5.3)
Sodium: 139 mmol/L (ref 135–146)
Total Bilirubin: 0.8 mg/dL (ref 0.2–1.2)
Total Protein: 6.5 g/dL (ref 6.1–8.1)

## 2019-03-05 LAB — CBC WITH DIFFERENTIAL/PLATELET
Absolute Monocytes: 510 {cells}/uL (ref 200–950)
Basophils Absolute: 31 {cells}/uL (ref 0–200)
Basophils Relative: 0.6 %
Eosinophils Absolute: 281 {cells}/uL (ref 15–500)
Eosinophils Relative: 5.4 %
HCT: 42.3 % (ref 38.5–50.0)
Hemoglobin: 14.3 g/dL (ref 13.2–17.1)
Lymphs Abs: 894 {cells}/uL (ref 850–3900)
MCH: 30.8 pg (ref 27.0–33.0)
MCHC: 33.8 g/dL (ref 32.0–36.0)
MCV: 91.2 fL (ref 80.0–100.0)
MPV: 9.9 fL (ref 7.5–12.5)
Monocytes Relative: 9.8 %
Neutro Abs: 3484 {cells}/uL (ref 1500–7800)
Neutrophils Relative %: 67 %
Platelets: 240 Thousand/uL (ref 140–400)
RBC: 4.64 Million/uL (ref 4.20–5.80)
RDW: 11.8 % (ref 11.0–15.0)
Total Lymphocyte: 17.2 %
WBC: 5.2 Thousand/uL (ref 3.8–10.8)

## 2019-03-05 LAB — VITAMIN D 25 HYDROXY (VIT D DEFICIENCY, FRACTURES): Vit D, 25-Hydroxy: 42 ng/mL (ref 30–100)

## 2019-03-05 LAB — MAGNESIUM: Magnesium: 1.9 mg/dL (ref 1.5–2.5)

## 2019-03-05 LAB — TSH: TSH: 1.24 mIU/L (ref 0.40–4.50)

## 2019-04-01 ENCOUNTER — Other Ambulatory Visit: Payer: Self-pay | Admitting: Internal Medicine

## 2019-04-01 ENCOUNTER — Other Ambulatory Visit: Payer: Self-pay | Admitting: *Deleted

## 2019-04-01 DIAGNOSIS — M25562 Pain in left knee: Secondary | ICD-10-CM

## 2019-04-01 DIAGNOSIS — G8929 Other chronic pain: Secondary | ICD-10-CM

## 2019-04-01 MED ORDER — MELOXICAM 15 MG PO TABS: ORAL_TABLET | ORAL | 0 refills | Status: DC

## 2019-04-05 DIAGNOSIS — G4733 Obstructive sleep apnea (adult) (pediatric): Secondary | ICD-10-CM | POA: Diagnosis not present

## 2019-04-13 DIAGNOSIS — R69 Illness, unspecified: Secondary | ICD-10-CM | POA: Diagnosis not present

## 2019-04-14 ENCOUNTER — Other Ambulatory Visit: Payer: Self-pay | Admitting: Internal Medicine

## 2019-05-03 DIAGNOSIS — G4733 Obstructive sleep apnea (adult) (pediatric): Secondary | ICD-10-CM | POA: Diagnosis not present

## 2019-06-11 ENCOUNTER — Other Ambulatory Visit: Payer: Self-pay | Admitting: Internal Medicine

## 2019-08-18 ENCOUNTER — Encounter: Payer: Medicare HMO | Admitting: Internal Medicine

## 2019-09-01 DIAGNOSIS — Z20828 Contact with and (suspected) exposure to other viral communicable diseases: Secondary | ICD-10-CM | POA: Diagnosis not present

## 2019-09-01 DIAGNOSIS — U071 COVID-19: Secondary | ICD-10-CM | POA: Diagnosis not present

## 2019-09-02 DIAGNOSIS — G4733 Obstructive sleep apnea (adult) (pediatric): Secondary | ICD-10-CM | POA: Diagnosis not present

## 2019-09-10 ENCOUNTER — Encounter: Payer: Medicare HMO | Admitting: Internal Medicine

## 2019-09-28 ENCOUNTER — Other Ambulatory Visit: Payer: Self-pay | Admitting: Internal Medicine

## 2019-10-01 ENCOUNTER — Other Ambulatory Visit: Payer: Self-pay | Admitting: Internal Medicine

## 2019-10-02 ENCOUNTER — Other Ambulatory Visit: Payer: Self-pay | Admitting: Internal Medicine

## 2019-10-12 ENCOUNTER — Encounter: Payer: Self-pay | Admitting: Internal Medicine

## 2019-10-12 NOTE — Patient Instructions (Signed)

## 2019-10-12 NOTE — Progress Notes (Signed)
Annual  Screening/Preventative Visit  & Comprehensive Evaluation & Examination     This very nice 69 y.o.male presents for a Screening /Preventative Visit & comprehensive evaluation and management of multiple medical co-morbidities.  Patient has been followed for HTN, HLD, Prediabetes and Vitamin D Deficiency.  Patient is on CPAP for OSA with improved sleep hygiene. Patient has GERD controlled on his meds. Patient relate that both he & his wife tested (+) positive for Covid -19 in June and both had minimal sx's.     HTN predates since 2016. Patient's BP has been controlled and today's BP is at goal -  128/84. Patient had a normal Cardiac MRI in 2018 by Dr Dorris Carnes. Patient denies any cardiac symptoms as chest pain, palpitations, shortness of breath, dizziness or ankle swelling.     Patient's hyperlipidemia is controlled with diet / Atorvastatin. Patient denies myalgias or other medication SE's. Last lipids were at goal except slightly elevated Trig's:  Lab Results  Component Value Date   CHOL 167 03/04/2019   HDL 50 03/04/2019   LDLCALC 89 03/04/2019   TRIG 185 (H) 03/04/2019   CHOLHDL 3.3 03/04/2019       Patient has hx/o prediabetes  (A1c 5.9% / 2011)  and patient denies reactive hypoglycemic symptoms, visual blurring, diabetic polys or paresthesias. Last A1c was Normal & at goal:  Lab Results  Component Value Date   HGBA1C 5.2 11/20/2018        Finally, patient has history of Vitamin D Deficiency ("31" / 2008) and last vitamin D was not at goal (70-100):  Lab Results  Component Value Date   VD25OH 42 03/04/2019    Current Outpatient Medications on File Prior to Visit  Medication Sig  . atorvastatin (LIPITOR) 80 MG tablet Take 1 tablet Daily for Cholesterol  . cholecalciferol (VITAMIN D) 1000 UNITS tablet Take 5,000 Units by mouth daily.   . cyclobenzaprine (FLEXERIL) 10 MG tablet Take 1/2 to 1 tablet 3 x /day if needed for Muscle Spasm  . meclizine (ANTIVERT) 25 MG  tablet Take 1 tablet 3 x /day if needed for Dizziness / Vertigo  . meloxicam (MOBIC) 15 MG tablet Take 1/2 to 1 tablet Daily with Food for Pain & Inflammation - limit to 4 days /week to avoid Kidney Damage  . Omega-3 Fatty Acids (FISH OIL) 1000 MG CAPS Take by mouth.  Marland Kitchen omeprazole (PRILOSEC) 20 MG capsule Take 20 mg by mouth daily.  . phentermine (ADIPEX-P) 37.5 MG tablet Take 1/2 to 1 tablet every morning for Dieting & Weight Loss  . sildenafil (VIAGRA) 100 MG tablet Take 1/2 to 1 tablet Daily as needed for XXXX   No current facility-administered medications on file prior to visit.   No Known Allergies   Past Medical History:  Diagnosis Date  . GERD (gastroesophageal reflux disease)   . Hyperlipidemia   . Obesity   . Prediabetes   . Sleep apnea    CPAP  . Vitamin D deficiency    Health Maintenance  Topic Date Due  . COVID-19 Vaccine (1) Never done  . INFLUENZA VACCINE  10/04/2019  . COLONOSCOPY  05/02/2021  . TETANUS/TDAP  11/10/2024  . Hepatitis C Screening  Completed  . PNA vac Low Risk Adult  Completed   Immunization History  Administered Date(s) Administered  . Influenza, High Dose Seasonal PF 10/21/2015, 12/24/2016, 01/10/2018, 11/20/2018  . PPD Test 11/10/2013, 11/11/2014  . Pneumococcal Conjugate-13 10/21/2015  . Pneumococcal Polysaccharide-23 05/31/2016  . Pneumococcal-Unspecified 04/15/2009  .  Td 11/17/2004  . Tdap 11/11/2014  . Zoster 10/28/2012   Last Colon - 05/02/2016 - Dr Ardis Hughs - Recc 5 yr f/u due Mar 2023  Past Surgical History:  Procedure Laterality Date  . SPINE SURGERY     [phone]   Family History  Problem Relation Age of Onset  . Hypertension Mother   . Heart disease Mother   . COPD Mother   . Heart failure Mother   . Heart failure Father   . Colon cancer Father        died at 35;pt unsure age,never knew father  . Diabetes Brother    Social History   Socioeconomic History  . Marital status: Married    Spouse name: Helene Kelp  .  Number of children: 2 daughters & 1 Gson  Occupational History  .   Retired   from [employer]  . Smoking status: Former Smoker    Packs/day: 1.00    Years: 32.00    Pack years: 32.00    Types: Cigarettes    Quit date: 02/03/1990    Years since quitting: 29.7  . Smokeless tobacco: Never Used  Substance and Sexual Activity  . Alcohol use: Yes    Alcohol/week: 22.0 standard drinks    Types: 14 Standard drinks or equivalent, 7 Cans of beer, 1 Shots of liquor per week    Comment: 7 beers a week plus 1 mixed drink daily.  . Drug use: No  . Sexual activity: Not Currently      ROS Constitutional: Denies fever, chills, weight loss/gain, headaches, insomnia,  night sweats or change in appetite. Does c/o fatigue. Eyes: Denies redness, blurred vision, diplopia, discharge, itchy or watery eyes.  ENT: Denies discharge, congestion, post nasal drip, epistaxis, sore throat, earache, hearing loss, dental pain, Tinnitus, Vertigo, Sinus pain or snoring.  Cardio: Denies chest pain, palpitations, irregular heartbeat, syncope, dyspnea, diaphoresis, orthopnea, PND, claudication or edema Respiratory: denies cough, dyspnea, DOE, pleurisy, hoarseness, laryngitis or wheezing.  Gastrointestinal: Denies dysphagia, heartburn, reflux, water brash, pain, cramps, nausea, vomiting, bloating, diarrhea, constipation, hematemesis, melena, hematochezia, jaundice or hemorrhoids Genitourinary: Denies dysuria, frequency, urgency, nocturia, hesitancy, discharge, hematuria or flank pain Musculoskeletal: Denies arthralgia, myalgia, stiffness, Jt. Swelling, pain, limp or strain/sprain. Denies Falls. Skin: Denies puritis, rash, hives, warts, acne, eczema or change in skin lesion Neuro: No weakness, tremor, incoordination, spasms, paresthesia or pain Psychiatric: Denies confusion, memory loss or sensory loss. Denies Depression. Endocrine: Denies change in weight, skin, hair change, nocturia, and paresthesia,  diabetic polys, visual blurring or hyper / hypo glycemic episodes.  Heme/Lymph: No excessive bleeding, bruising or enlarged lymph nodes.  Physical Exam  BP 128/84   Pulse 72   Temp (!) 97.2 F (36.2 C)   Resp 18   Ht 5\' 10"  (1.778 m)   Wt 207 lb 9.6 oz (94.2 kg)   BMI 29.79 kg/m   General Appearance: Well nourished and well groomed and in no apparent distress.  Eyes: PERRLA, EOMs, conjunctiva no swelling or erythema, normal fundi and vessels. Sinuses: No frontal/maxillary tenderness ENT/Mouth: EACs patent / TMs  nl. Nares clear without erythema, swelling, mucoid exudates. Oral hygiene is good. No erythema, swelling, or exudate. Tongue normal, non-obstructing. Tonsils not swollen or erythematous. Hearing normal.  Neck: Supple, thyroid not palpable. No bruits, nodes or JVD. Respiratory: Respiratory effort normal.  BS equal and clear bilateral without rales, rhonci, wheezing or stridor. Cardio: Heart sounds are normal with regular rate and rhythm and no murmurs, rubs  or gallops. Peripheral pulses are normal and equal bilaterally without edema. No aortic or femoral bruits. Chest: symmetric with normal excursions and percussion.  Abdomen: Soft, with Nl bowel sounds. Nontender, no guarding, rebound, hernias, masses, or organomegaly.  Lymphatics: Non tender without lymphadenopathy.  Musculoskeletal: Full ROM all peripheral extremities, joint stability, 5/5 strength, and normal gait. Skin: Warm and dry without rashes, lesions, cyanosis, clubbing or  ecchymosis.  Neuro: Cranial nerves intact, reflexes equal bilaterally. Normal muscle tone, no cerebellar symptoms. Sensation intact.  Pysch: Alert and oriented X 3 with normal affect, insight and judgment appropriate.   Assessment and Plan  1. Annual Preventative/Screening Exam    2. Essential hypertension  - EKG 12-Lead - [place], RETROPERITNL ABD,  LTD - Urinalysis, Routine w reflex microscopic - Microalbumin / creatinine urine ratio - CBC  with Differential/Platelet - COMPLETE METABOLIC PANEL WITH GFR - Magnesium - TSH  3. Hyperlipidemia, mixed  - EKG 12-Lead - [place], RETROPERITNL ABD,  LTD - Lipid panel - TSH  4. Abnormal glucose  - EKG 12-Lead - [place], RETROPERITNL ABD,  LTD - Hemoglobin A1c - Insulin, random  5. Vitamin D deficiency  - VITAMIN D 25 Hydroxy  6. Prediabetes  - EKG 12-Lead - [place], RETROPERITNL ABD,  LTD  7. BPH with obstruction/lower urinary tract symptoms  - PSA  8. OSA on CPAP   9. Screening for colorectal cancer  - POC Hemoccult Bld/Stl   10. Prostate cancer screening  - PSA  11. Screening for ischemic heart disease  - EKG 12-Lead  12. FHx: heart disease  - EKG 12-Lead - [place], RETROPERITNL ABD,  LTD  13. Former smoker  - EKG 12-Lead - [place], RETROPERITNL ABD,  LTD  14. Screening for AAA (aortic abdominal aneurysm)  - [place], RETROPERITNL ABD,  LTD  15. Medication management  - Urinalysis, Routine w reflex microscopic - Microalbumin / creatinine urine ratio - CBC with Differential/Platelet - COMPLETE METABOLIC PANEL WITH GFR - Magnesium - Lipid panel - TSH - Hemoglobin A1c - Insulin, random - VITAMIN D 25 Hydroxy        Patient was counseled in prudent diet, weight control to achieve/maintain BMI less than 25, BP monitoring, regular exercise and medications as discussed.  Discussed med effects and SE's. Routine screening labs and tests as requested with regular follow-up as recommended. Over 40 minutes of exam, counseling, chart review and high complex critical decision making was performed   Kirtland Bouchard, MD

## 2019-10-13 ENCOUNTER — Ambulatory Visit (INDEPENDENT_AMBULATORY_CARE_PROVIDER_SITE_OTHER): Payer: Medicare HMO | Admitting: Internal Medicine

## 2019-10-13 ENCOUNTER — Other Ambulatory Visit: Payer: Self-pay

## 2019-10-13 VITALS — BP 128/84 | HR 72 | Temp 97.2°F | Resp 18 | Ht 70.0 in | Wt 207.6 lb

## 2019-10-13 DIAGNOSIS — N138 Other obstructive and reflux uropathy: Secondary | ICD-10-CM

## 2019-10-13 DIAGNOSIS — Z136 Encounter for screening for cardiovascular disorders: Secondary | ICD-10-CM

## 2019-10-13 DIAGNOSIS — E559 Vitamin D deficiency, unspecified: Secondary | ICD-10-CM | POA: Diagnosis not present

## 2019-10-13 DIAGNOSIS — Z87891 Personal history of nicotine dependence: Secondary | ICD-10-CM | POA: Diagnosis not present

## 2019-10-13 DIAGNOSIS — Z125 Encounter for screening for malignant neoplasm of prostate: Secondary | ICD-10-CM | POA: Diagnosis not present

## 2019-10-13 DIAGNOSIS — G4733 Obstructive sleep apnea (adult) (pediatric): Secondary | ICD-10-CM

## 2019-10-13 DIAGNOSIS — R7309 Other abnormal glucose: Secondary | ICD-10-CM

## 2019-10-13 DIAGNOSIS — Z0000 Encounter for general adult medical examination without abnormal findings: Secondary | ICD-10-CM | POA: Diagnosis not present

## 2019-10-13 DIAGNOSIS — Z79899 Other long term (current) drug therapy: Secondary | ICD-10-CM

## 2019-10-13 DIAGNOSIS — E782 Mixed hyperlipidemia: Secondary | ICD-10-CM | POA: Diagnosis not present

## 2019-10-13 DIAGNOSIS — N401 Enlarged prostate with lower urinary tract symptoms: Secondary | ICD-10-CM

## 2019-10-13 DIAGNOSIS — Z0001 Encounter for general adult medical examination with abnormal findings: Secondary | ICD-10-CM

## 2019-10-13 DIAGNOSIS — I10 Essential (primary) hypertension: Secondary | ICD-10-CM | POA: Diagnosis not present

## 2019-10-13 DIAGNOSIS — Z1211 Encounter for screening for malignant neoplasm of colon: Secondary | ICD-10-CM

## 2019-10-13 DIAGNOSIS — Z8249 Family history of ischemic heart disease and other diseases of the circulatory system: Secondary | ICD-10-CM | POA: Diagnosis not present

## 2019-10-13 DIAGNOSIS — Z9989 Dependence on other enabling machines and devices: Secondary | ICD-10-CM

## 2019-10-13 DIAGNOSIS — R7303 Prediabetes: Secondary | ICD-10-CM

## 2019-10-14 DIAGNOSIS — R69 Illness, unspecified: Secondary | ICD-10-CM | POA: Diagnosis not present

## 2019-10-14 LAB — COMPLETE METABOLIC PANEL WITH GFR
AG Ratio: 2.2 (calc) (ref 1.0–2.5)
ALT: 32 U/L (ref 9–46)
AST: 21 U/L (ref 10–35)
Albumin: 4.4 g/dL (ref 3.6–5.1)
Alkaline phosphatase (APISO): 104 U/L (ref 35–144)
BUN: 14 mg/dL (ref 7–25)
CO2: 26 mmol/L (ref 20–32)
Calcium: 9.3 mg/dL (ref 8.6–10.3)
Chloride: 105 mmol/L (ref 98–110)
Creat: 0.8 mg/dL (ref 0.70–1.25)
GFR, Est African American: 106 mL/min/{1.73_m2} (ref >=60)
GFR, Est Non African American: 91 mL/min/{1.73_m2} (ref >=60)
Globulin: 2 g/dL (calc) (ref 1.9–3.7)
Glucose, Bld: 91 mg/dL (ref 65–99)
Potassium: 4.3 mmol/L (ref 3.5–5.3)
Sodium: 140 mmol/L (ref 135–146)
Total Bilirubin: 0.7 mg/dL (ref 0.2–1.2)
Total Protein: 6.4 g/dL (ref 6.1–8.1)

## 2019-10-14 LAB — CBC WITH DIFFERENTIAL/PLATELET
Absolute Monocytes: 545 cells/uL (ref 200–950)
Basophils Absolute: 29 cells/uL (ref 0–200)
Basophils Relative: 0.7 %
Eosinophils Absolute: 242 cells/uL (ref 15–500)
Eosinophils Relative: 5.9 %
HCT: 35.8 % — ABNORMAL LOW (ref 38.5–50.0)
Hemoglobin: 12.1 g/dL — ABNORMAL LOW (ref 13.2–17.1)
Lymphs Abs: 1000 cells/uL (ref 850–3900)
MCH: 31.2 pg (ref 27.0–33.0)
MCHC: 33.8 g/dL (ref 32.0–36.0)
MCV: 92.3 fL (ref 80.0–100.0)
MPV: 9.9 fL (ref 7.5–12.5)
Monocytes Relative: 13.3 %
Neutro Abs: 2284 cells/uL (ref 1500–7800)
Neutrophils Relative %: 55.7 %
Platelets: 233 10*3/uL (ref 140–400)
RBC: 3.88 10*6/uL — ABNORMAL LOW (ref 4.20–5.80)
RDW: 12.6 % (ref 11.0–15.0)
Total Lymphocyte: 24.4 %
WBC: 4.1 10*3/uL (ref 3.8–10.8)

## 2019-10-14 LAB — URINALYSIS, ROUTINE W REFLEX MICROSCOPIC
Bilirubin Urine: NEGATIVE
Glucose, UA: NEGATIVE
Hgb urine dipstick: NEGATIVE
Ketones, ur: NEGATIVE
Leukocytes,Ua: NEGATIVE
Nitrite: NEGATIVE
Protein, ur: NEGATIVE
Specific Gravity, Urine: 1.007 (ref 1.001–1.03)
pH: 6 (ref 5.0–8.0)

## 2019-10-14 LAB — PSA: PSA: 0.8 ng/mL (ref <=4.0)

## 2019-10-14 LAB — LIPID PANEL
Cholesterol: 138 mg/dL (ref <200)
HDL: 35 mg/dL — ABNORMAL LOW (ref >=40)
LDL Cholesterol (Calc): 73 mg/dL (calc)
Non-HDL Cholesterol (Calc): 103 mg/dL (calc) (ref <130)
Total CHOL/HDL Ratio: 3.9 (calc) (ref <5.0)
Triglycerides: 210 mg/dL — ABNORMAL HIGH (ref <150)

## 2019-10-14 LAB — TSH: TSH: 1.87 mIU/L (ref 0.40–4.50)

## 2019-10-14 LAB — MICROALBUMIN / CREATININE URINE RATIO
Creatinine, Urine: 42 mg/dL (ref 20–320)
Microalb, Ur: <0.2 mg/dL

## 2019-10-14 LAB — MAGNESIUM: Magnesium: 1.8 mg/dL (ref 1.5–2.5)

## 2019-10-14 LAB — INSULIN, RANDOM: Insulin: 6.3 u[IU]/mL

## 2019-10-14 LAB — HEMOGLOBIN A1C
Hgb A1c MFr Bld: 5.5 % of total Hgb (ref <5.7)
Mean Plasma Glucose: 111 (calc)
eAG (mmol/L): 6.2 (calc)

## 2019-10-14 LAB — VITAMIN D 25 HYDROXY (VIT D DEFICIENCY, FRACTURES): Vit D, 25-Hydroxy: 49 ng/mL (ref 30–100)

## 2019-10-14 NOTE — Progress Notes (Signed)
===========================================================  -    PSA - very Low - Great  ===========================================================  -   Magnesium  -   1.8   -  very  low- goal is betw 2.0 - 2.5,   - So..............Marland Kitchen  Recommend that you take  Magnesium 500 mg tablet daily   - also important to eat lots of  leafy green vegetables   - spinach - Kale - collards - greens - okra - asparagus  - broccoli - quinoa - squash - almonds   - black, red, white beans  -  peas - green beans ===========================================================  -  Total Chol = 138 and LDL Chol = 73 - Both  Excellent   - Very low risk for Heart Attack  / Stroke =============================================================  - But, Triglycerides (   210   ) or fats in blood are too high  (goal is less than 150)    - Recommend avoid fried & greasy foods,  sweets / candy,   - Avoid white rice  (brown or wild rice or Quinoa is OK),   - Avoid white potatoes  (sweet potatoes are OK)   - Avoid anything made from white flour  - bagels, doughnuts, rolls, buns, biscuits, white and   wheat breads, pizza crust and traditional  pasta made of white flour & egg white  - (vegetarian pasta or spinach or wheat pasta is OK).    - Multi-grain bread is OK - like multi-grain flat bread or  sandwich thins.   - Avoid alcohol in excess.   - Exercise is also important. ===========================================================  -  A1c - Normal - Great - No Diabetes ===========================================================  -  Vitamin D = 49 - sl low -  - Vitamin D goal is between 70-100.   - Please INCREASE your Vitamin D 5,000 unit capsules up to 2 capsules = 10,000 units /daily  - It is very important as a natural anti-inflammatory and helping the  immune system protect against viral infections, like the Covid-19    helping hair, skin, and nails, as well as reducing stroke and  heart attack risk.   - It helps your bones and helps with mood.  - It also decreases numerous cancer risks so please take it as directed.   - Low Vit D is associated with a 200-300% higher risk for CANCER   and 200-300% higher risk for HEART   ATTACK  &  STROKE.    - It is also associated with higher death rate at younger ages,   autoimmune diseases like Rheumatoid arthritis, Lupus, Multiple Sclerosis.     - Also many other serious conditions, like depression, Alzheimer's  Dementia, infertility, muscle aches, fatigue, fibromyalgia - just to name a few.  ==========================================================  - All Else - CBC - Kidneys - Electrolytes - Liver - Magnesium & Thyroid    - all  Normal / OK ==========================================================

## 2019-12-07 DIAGNOSIS — G4733 Obstructive sleep apnea (adult) (pediatric): Secondary | ICD-10-CM | POA: Diagnosis not present

## 2019-12-08 ENCOUNTER — Other Ambulatory Visit: Payer: Self-pay | Admitting: Internal Medicine

## 2019-12-21 DIAGNOSIS — R69 Illness, unspecified: Secondary | ICD-10-CM | POA: Diagnosis not present

## 2019-12-22 DIAGNOSIS — M1712 Unilateral primary osteoarthritis, left knee: Secondary | ICD-10-CM | POA: Diagnosis not present

## 2019-12-22 DIAGNOSIS — M1711 Unilateral primary osteoarthritis, right knee: Secondary | ICD-10-CM | POA: Diagnosis not present

## 2020-01-06 ENCOUNTER — Other Ambulatory Visit: Payer: Self-pay | Admitting: Internal Medicine

## 2020-01-07 DIAGNOSIS — G4733 Obstructive sleep apnea (adult) (pediatric): Secondary | ICD-10-CM | POA: Diagnosis not present

## 2020-01-09 DIAGNOSIS — M1712 Unilateral primary osteoarthritis, left knee: Secondary | ICD-10-CM | POA: Diagnosis not present

## 2020-01-09 DIAGNOSIS — M1711 Unilateral primary osteoarthritis, right knee: Secondary | ICD-10-CM | POA: Diagnosis not present

## 2020-01-11 DIAGNOSIS — R42 Dizziness and giddiness: Secondary | ICD-10-CM | POA: Diagnosis not present

## 2020-01-18 ENCOUNTER — Ambulatory Visit: Payer: Medicare HMO | Admitting: Adult Health

## 2020-01-25 ENCOUNTER — Encounter: Payer: Self-pay | Admitting: Adult Health

## 2020-01-25 NOTE — Progress Notes (Deleted)
WELCOME TO MEDICARE WELLNESS VISIT AND FOLLOW UP Assessment:   Encounter for Medicare annual wellness exam 1 year  Essential hypertension - continue medications, DASH diet, exercise and monitor at home. Call if greater than 130/80.  -     CBC with Differential/Platelet -     CMP/GFR -     TSH  OSA on CPAP Weight loss advised, continue CPAP  Mixed hyperlipidemia -continue medications, check lipids, decrease fatty foods, increase activity.  -     Lipid panel  Abnormal glucose Discussed general issues about diabetes pathophysiology and management., Educational material distributed., Suggested low cholesterol diet., Encouraged aerobic exercise., Discussed foot care., Reminded to get yearly retinal exam.  Medication management -     Magnesium  Gastroesophageal reflux disease, esophagitis presence not specified Continue PPI/H2 blocker, diet discussed  Vitamin D deficiency Continue supplement  Overweight - BMI 29 *** - long discussion about weight loss, diet, and exercise ***  Former smoker monitor   Over 30 minutes of exam, counseling, chart review, and critical decision making was performed  Future Appointments  Date Time Provider [place]  01/26/2020 11:30 AM Jason Comber, NP [facility] None  04/25/2020  9:30 AM [name], MD [facility] None  10/25/2020 10:00 AM [name], MD [facility] None  01/24/2021 11:30 AM Jason Comber, NP [facility] None     Plan:   During the course of the visit the patient was educated and counseled about appropriate screening and preventive services including:    Pneumococcal vaccine   Influenza vaccine  Prevnar 13  Td vaccine  Screening electrocardiogram  Colorectal cancer screening  Diabetes screening  Glaucoma screening  Nutrition counseling    Subjective:  Jason Dennis is a 69 y.o. male who presents for Medicare Annual Wellness Visit and 3 month follow up for HTN, hyperlipidemia, glucose,  and vitamin D Def.  Keeps his granddaughter Jason Dennis, 68 y/o  He has OSA on CPAP, ***  He reports chronic right knee pain, previously getting injections by ortho, requested referral back.  L shoulder impingement *** dx   BMI is There is no height or weight on file to calculate BMI., he is not currently working on diet and exercise.  He is on CPAP for OSA. He is on phentermine, stopped and it and just restarted it. He declines following up 1 month for phentermine follow up. If not losing weight at 3 month, stop the medication.  Wt Readings from Last 3 Encounters:  10/13/19 207 lb 9.6 oz (94.2 kg)  03/04/19 216 lb (98 kg)  11/20/18 213 lb (96.6 kg)    His blood pressure has been controlled at home, today their BP is     He does workout. He denies chest pain, shortness of breath, dizziness.   He is on cholesterol medication and denies myalgias. His cholesterol is at goal. The cholesterol last visit was:   Lab Results  Component Value Date   CHOL 138 10/13/2019   HDL 35 (L) 10/13/2019   LDLCALC 73 10/13/2019   TRIG 210 (H) 10/13/2019   CHOLHDL 3.9 10/13/2019   He does have diet controlled prediabetes.  He reports that he is doing well with diet and exercise.   Lab Results  Component Value Date   HGBA1C 5.5 10/13/2019    Last GFR Lab Results  Component Value Date   GFRNONAA 91 10/13/2019    Patient is on Vitamin D supplement.   Lab Results  Component Value Date   VD25OH 49 10/13/2019  Medication Review: Current Outpatient Medications on File Prior to Visit  Medication Sig Dispense Refill  . atorvastatin (LIPITOR) 80 MG tablet Take     1 tablet     Daily       for Cholesterol 90 tablet 0  . cholecalciferol (VITAMIN D) 1000 UNITS tablet Take 5,000 Units by mouth daily.     . cyclobenzaprine (FLEXERIL) 10 MG tablet Take 1/2 to 1 tablet 3 x /day if needed for Muscle Spasm 30 tablet 0  . meclizine (ANTIVERT) 25 MG tablet Take 1 tablet 3 x /day if needed for Dizziness /  Vertigo 270 tablet 1  . meloxicam (MOBIC) 15 MG tablet Take 1/2 to 1 tablet Daily with Food for Pain & Inflammation - limit to 4 days /week to avoid Kidney Damage 90 tablet 0  . Omega-3 Fatty Acids (FISH OIL) 1000 MG CAPS Take by mouth.    Marland Kitchen omeprazole (PRILOSEC) 20 MG capsule Take 20 mg by mouth daily.    . phentermine (ADIPEX-P) 37.5 MG tablet Take 1/2 to 1 tablet every morning for Dieting & Weight Loss 90 tablet 1  . sildenafil (VIAGRA) 100 MG tablet TAKE 1/2 TO 1 TABLET BY MOUTH DAILY AS NEEDED 30 tablet 0   No current facility-administered medications on file prior to visit.    Allergies: No Known Allergies  Current Problems (verified) has Hyperlipidemia, mixed; Hypertension; Abnormal glucose; Vitamin D deficiency; Obesity (BMI 30.0-34.9); OSA on CPAP; GERD (gastroesophageal reflux disease); Medication management; Tear of left supraspinatus tendon; Impingement syndrome of left shoulder; Tendinopathy of left biceps; FHx: heart disease; and Former smoker (32 pack year, quit 1991) on their problem list.  Screening Tests Immunization History  Administered Date(s) Administered  . Influenza, High Dose Seasonal PF 10/21/2015, 12/24/2016, 01/10/2018, 11/20/2018  . PPD Test 11/10/2013, 11/11/2014  . Pneumococcal Conjugate-13 10/21/2015  . Pneumococcal Polysaccharide-23 05/31/2016  . Pneumococcal-Unspecified 04/15/2009  . Td 11/17/2004  . Tdap 11/11/2014  . Zoster 10/28/2012    Preventative care: Last colonoscopy: 04/2016 due 2023  Prior vaccinations: TD or Tdap: 2016  Influenza: 2020, *** Pneumococcal: 2018 Prevnar13: 2017 Shingles/Zostavax: 2014 Covid 19: ***  Names of Other Physician/Practitioners you currently use: 1.  [facility] here for primary care 2. Dr. Delman Cheadle, eye doctor, last visit 2019 3. Dr. Aggie Moats, dentist, last visit 2019, q71m  Patient Care Team: [name], MD as PCP - General  Surgical: He  has a past surgical  history that includes Spine surgery. Family His family history includes COPD in his mother; Colon cancer in his father; Diabetes in his brother; Heart disease in his mother; Heart failure in his father and mother; Hypertension in his mother. Social history  He reports that he quit smoking about 29 years ago. His smoking use included cigarettes. He has a 32.00 pack-year smoking history. He has never used smokeless tobacco. He reports current alcohol use of about 14.0 standard drinks of alcohol per week. He reports that he does not use drugs.  MEDICARE WELLNESS OBJECTIVES: Physical activity:   Cardiac risk factors:   Depression/mood screen:   Depression screen [facility] 2/9 10/12/2019  Decreased Interest 0  Down, Depressed, Hopeless 0  PHQ - 2 Score 0    ADLs:  In your present state of health, do you have any difficulty performing the following activities: 10/12/2019  Hearing? N  Vision? N  Difficulty concentrating or making decisions? N  Walking or climbing stairs? N  Dressing or bathing? N  Doing errands, shopping? N  Some recent data might be hidden    Cognitive Testing  Alert? Yes  Normal Appearance?Yes  Oriented to person? Yes  Place? Yes   Time? Yes  Recall of three objects?  Yes  Can perform simple calculations? Yes  Displays appropriate judgment?Yes  Can read the correct time from a watch face?Yes  EOL planning:     Objective:   There were no vitals filed for this visit. There is no height or weight on file to calculate BMI.  General appearance: alert, no distress, WD/WN, male HEENT: normocephalic, sclerae anicteric, TMs pearly, nares patent, no discharge or erythema, pharynx normal Oral cavity: MMM, no lesions Neck: supple, no lymphadenopathy, no thyromegaly, no masses Heart: RRR, normal S1, S2, no murmurs Lungs: CTA bilaterally, no wheezes, rhonchi, or rales Abdomen: +bs, soft, non tender, non distended, no masses, no hepatomegaly, no splenomegaly Musculoskeletal:  nontender, no swelling, no obvious deformity Extremities: no edema, no cyanosis, no clubbing Pulses: 2+ symmetric, upper and lower extremities, normal cap refill Neurological: alert, oriented x 3, CN2-12 intact, strength normal upper extremities and lower extremities, sensation normal throughout, DTRs 2+ throughout, no cerebellar signs, gait normal Psychiatric: normal affect, behavior normal, pleasant   Medicare Attestation I have personally reviewed: The patient's medical and social history Their use of alcohol, tobacco or illicit drugs Their current medications and supplements The patient's functional ability including ADLs,fall risks, home safety risks, cognitive, and hearing and visual impairment Diet and physical activities Evidence for depression or mood disorders  The patient's weight, height, BMI, and visual acuity have been recorded in the chart.  I have made referrals, counseling, and provided education to the patient based on review of the above and I have provided the patient with a written personalized care plan for preventive services.     Izora Ribas, NP   01/25/2020

## 2020-01-26 ENCOUNTER — Ambulatory Visit: Payer: Medicare HMO | Admitting: Adult Health

## 2020-01-26 DIAGNOSIS — K219 Gastro-esophageal reflux disease without esophagitis: Secondary | ICD-10-CM

## 2020-01-26 DIAGNOSIS — E559 Vitamin D deficiency, unspecified: Secondary | ICD-10-CM

## 2020-01-26 DIAGNOSIS — E782 Mixed hyperlipidemia: Secondary | ICD-10-CM

## 2020-01-26 DIAGNOSIS — Z87891 Personal history of nicotine dependence: Secondary | ICD-10-CM

## 2020-01-26 DIAGNOSIS — Z0000 Encounter for general adult medical examination without abnormal findings: Secondary | ICD-10-CM

## 2020-01-26 DIAGNOSIS — G4733 Obstructive sleep apnea (adult) (pediatric): Secondary | ICD-10-CM

## 2020-01-26 DIAGNOSIS — R7309 Other abnormal glucose: Secondary | ICD-10-CM

## 2020-01-26 DIAGNOSIS — Z79899 Other long term (current) drug therapy: Secondary | ICD-10-CM

## 2020-01-26 DIAGNOSIS — E663 Overweight: Secondary | ICD-10-CM

## 2020-01-26 DIAGNOSIS — I10 Essential (primary) hypertension: Secondary | ICD-10-CM

## 2020-02-06 DIAGNOSIS — G4733 Obstructive sleep apnea (adult) (pediatric): Secondary | ICD-10-CM | POA: Diagnosis not present

## 2020-02-09 DIAGNOSIS — M1712 Unilateral primary osteoarthritis, left knee: Secondary | ICD-10-CM | POA: Diagnosis not present

## 2020-02-09 DIAGNOSIS — M1711 Unilateral primary osteoarthritis, right knee: Secondary | ICD-10-CM | POA: Diagnosis not present

## 2020-02-14 ENCOUNTER — Other Ambulatory Visit: Payer: Self-pay | Admitting: Internal Medicine

## 2020-02-14 MED ORDER — SILDENAFIL CITRATE 100 MG PO TABS: ORAL_TABLET | ORAL | 0 refills | Status: DC

## 2020-02-17 NOTE — Progress Notes (Signed)
ANNUAL MEDICARE WELLNESS VISIT AND FOLLOW UP Assessment:   Encounter for Medicare annual wellness exam 1 year  Essential hypertension - continue medications, DASH diet, exercise and monitor at home. Call if greater than 130/80.  -     CBC with Differential/Platelet -     CMP/GFR -     TSH  OSA on CPAP Weight loss advised, continue CPAP  Mixed hyperlipidemia -continue medications, check lipids, decrease fatty foods, increase activity.  -     Lipid panel  Abnormal glucose Discussed general issues about diabetes pathophysiology and management., Educational material distributed., Suggested low cholesterol diet., Encouraged aerobic exercise., Discussed foot care., Reminded to get yearly retinal exam.  Medication management -     Magnesium  Gastroesophageal reflux disease, esophagitis presence not specified Continue PPI/H2 blocker, diet discussed  Vitamin D deficiency Continue supplement  Morbid obesity ([place]) - BMI 30 + with OSA - long discussion about weight loss, diet, and exercise  Former smoker monitor   Over 30 minutes of exam, counseling, chart review, and critical decision making was performed  Future Appointments  Date Time Provider [place]  10/25/2020 10:00 AM [name], MD [facility] None  02/20/2021 10:30 AM Liane Comber, NP [facility] None     Plan:   During the course of the visit the patient was educated and counseled about appropriate screening and preventive services including:    Pneumococcal vaccine   Influenza vaccine  Prevnar 13  Td vaccine  Screening electrocardiogram  Colorectal cancer screening  Diabetes screening  Glaucoma screening  Nutrition counseling    Subjective:  Jason Dennis is a 69 y.o. male who presents for Medicare Annual Wellness Visit and 3 month follow up for HTN, hyperlipidemia, glucose, and vitamin D Def.  Keeps his granddaughter Hubert Azure  He reports chronic right knee pain, previously  getting injections by ortho ([facility] - Dr. Mayer Camel) and planning to have replaced in the next year. Also hx L shoulder impingement, saw Dr. Erlinda Hong and was discussing surgery but reports seems to have resolved. Takes 800 mg ibuprofen in the AM prior to work, takes tylenol later in the day, also uses topical CBD.   BMI is Body mass index is 30.42 kg/m., he is not currently working on diet and exercise.  He is on CPAP for OSA. He is on phentermine, currently taking a break for a few months after having covid 19, was down to 199 lb. Trying to walk for exercise.  Wt Readings from Last 3 Encounters:  02/18/20 212 lb (96.2 kg)  10/13/19 207 lb 9.6 oz (94.2 kg)  03/04/19 216 lb (98 kg)    His blood pressure has been controlled at home, today their BP is BP: 134/74   He does workout. He denies chest pain, shortness of breath, dizziness.   He is on cholesterol medication and denies myalgias. His cholesterol is at goal. The cholesterol last visit was:   Lab Results  Component Value Date   CHOL 138 10/13/2019   HDL 35 (L) 10/13/2019   LDLCALC 73 10/13/2019   TRIG 210 (H) 10/13/2019   CHOLHDL 3.9 10/13/2019   He does have diet controlled prediabetes.  He reports that he is doing well with diet and exercise.   Lab Results  Component Value Date   HGBA1C 5.5 10/13/2019    Last GFR Lab Results  Component Value Date   GFRNONAA 91 10/13/2019    Patient is on Vitamin D supplement.   Lab Results  Component Value Date  VD25OH 49 10/13/2019       Medication Review: Current Outpatient Medications on File Prior to Visit  Medication Sig Dispense Refill   atorvastatin (LIPITOR) 80 MG tablet Take     1 tablet     Daily       for Cholesterol 90 tablet 0   cholecalciferol (VITAMIN D) 1000 UNITS tablet Take 5,000 Units by mouth daily.      cyclobenzaprine (FLEXERIL) 10 MG tablet Take 1/2 to 1 tablet 3 x /day if needed for Muscle Spasm 30 tablet 0   Omega-3 Fatty Acids (FISH OIL) 1000 MG CAPS Take  by mouth.     omeprazole (PRILOSEC) 20 MG capsule Take 20 mg by mouth daily.     phentermine (ADIPEX-P) 37.5 MG tablet Take 1/2 to 1 tablet every morning for Dieting & Weight Loss (Patient not taking: Reported on 02/18/2020) 90 tablet 1   No current facility-administered medications on file prior to visit.    Allergies: No Known Allergies  Current Problems (verified) has Hyperlipidemia, mixed; Hypertension; Abnormal glucose; Vitamin D deficiency; Overweight (BMI 25.0-29.9); OSA on CPAP; GERD (gastroesophageal reflux disease); Medication management; Tear of left supraspinatus tendon; Impingement syndrome of left shoulder; Tendinopathy of left biceps; FHx: heart disease; Former smoker (32 pack year, quit 1991); and Morbid obesity ([place]) on their problem list.  Screening Tests Immunization History  Administered Date(s) Administered   Influenza, High Dose Seasonal PF 10/21/2015, 12/24/2016, 01/10/2018, 11/20/2018   PPD Test 11/10/2013, 11/11/2014   Pneumococcal Conjugate-13 10/21/2015   Pneumococcal Polysaccharide-23 05/31/2016   Pneumococcal-Unspecified 04/15/2009   Td 11/17/2004   Tdap 11/11/2014   Zoster 10/28/2012    Preventative care: Last colonoscopy: 04/2016 due 2023  Prior vaccinations: TD or Tdap: 2016  Influenza: 2020, TODAY  Pneumococcal: 2018 Prevnar13: 2017 Shingles/Zostavax: 2014 Covid 19: declines   Names of Other Physician/Practitioners you currently use: 1. [facility] here for primary care 2. Dr. Delman Cheadle, eye doctor, last visit 2020 3. Dr. Aggie Moats, dentist, last visit 2021, q78m  Patient Care Team: [name], MD as PCP - General  Surgical: He  has a past surgical history that includes Spine surgery. Family His family history includes COPD in his mother; Colon cancer in his father; Diabetes in his brother; Heart disease in his mother; Heart failure in his father and mother; Hypertension in his  mother. Social history  He reports that he quit smoking about 30 years ago. His smoking use included cigarettes. He has a 32.00 pack-year smoking history. He has never used smokeless tobacco. He reports current alcohol use of about 14.0 standard drinks of alcohol per week. He reports that he does not use drugs.  MEDICARE WELLNESS OBJECTIVES: Physical activity: Current Exercise Habits: The patient does not participate in regular exercise at present, Exercise limited by: orthopedic condition(s) Cardiac risk factors: Cardiac Risk Factors include: advanced age (>50men, >28 women);dyslipidemia;hypertension;male gender;obesity (BMI >30kg/m2);sedentary lifestyle;smoking/ tobacco exposure Depression/mood screen:   Depression screen [facility] 2/9 02/18/2020  Decreased Interest 0  Down, Depressed, Hopeless 0  PHQ - 2 Score 0    ADLs:  In your present state of health, do you have any difficulty performing the following activities: 02/18/2020 10/12/2019  Hearing? Y N  Comment has hearing aids, not wearing -  Vision? N N  Difficulty concentrating or making decisions? N N  Walking or climbing stairs? N N  Dressing or bathing? N N  Doing errands, shopping? N N  Some recent data might be hidden  Cognitive Testing  Alert? Yes  Normal Appearance?Yes  Oriented to person? Yes  Place? Yes   Time? Yes  Recall of three objects?  Yes  Can perform simple calculations? Yes  Displays appropriate judgment?Yes  Can read the correct time from a watch face?Yes  EOL planning: Does Patient Have a Medical Advance Directive?: Yes Type of Advance Directive: [place] will Does patient want to make changes to medical advance directive?: No - Patient declined Copy of [place] in Chart?: No - copy requested   Objective:   Today's Vitals   02/18/20 1031  BP: 134/74  Pulse: 76  Temp: (!) 97.5 F (36.4 C)  SpO2: 98%  Weight: 212 lb (96.2 kg)   Body mass index is 30.42  kg/m.  General appearance: alert, no distress, WD/WN, male HEENT: normocephalic, sclerae anicteric, TMs pearly, nares patent, no discharge or erythema, pharynx normal Oral cavity: MMM, no lesions Neck: supple, no lymphadenopathy, no thyromegaly, no masses Heart: RRR, normal S1, S2, no murmurs Lungs: CTA bilaterally, no wheezes, rhonchi, or rales Abdomen: +bs, soft, non tender, non distended, no masses, no hepatomegaly, no splenomegaly Musculoskeletal: nontender, no swelling, no obvious deformity Extremities: no edema, no cyanosis, no clubbing Pulses: 2+ symmetric, upper and lower extremities, normal cap refill Neurological: alert, oriented x 3, CN2-12 intact, strength normal upper extremities and lower extremities, sensation normal throughout, DTRs 2+ throughout, no cerebellar signs, gait normal Psychiatric: normal affect, behavior normal, pleasant   Medicare Attestation I have personally reviewed: The patient's medical and social history Their use of alcohol, tobacco or illicit drugs Their current medications and supplements The patient's functional ability including ADLs,fall risks, home safety risks, cognitive, and hearing and visual impairment Diet and physical activities Evidence for depression or mood disorders  The patient's weight, height, BMI, and visual acuity have been recorded in the chart.  I have made referrals, counseling, and provided education to the patient based on review of the above and I have provided the patient with a written personalized care plan for preventive services.     Izora Ribas, NP   02/18/2020

## 2020-02-18 ENCOUNTER — Other Ambulatory Visit: Payer: Self-pay

## 2020-02-18 ENCOUNTER — Encounter: Payer: Self-pay | Admitting: Adult Health

## 2020-02-18 ENCOUNTER — Ambulatory Visit (INDEPENDENT_AMBULATORY_CARE_PROVIDER_SITE_OTHER): Payer: Medicare HMO | Admitting: Adult Health

## 2020-02-18 VITALS — BP 134/74 | HR 76 | Temp 97.5°F | Wt 212.0 lb

## 2020-02-18 DIAGNOSIS — Z79899 Other long term (current) drug therapy: Secondary | ICD-10-CM | POA: Diagnosis not present

## 2020-02-18 DIAGNOSIS — I10 Essential (primary) hypertension: Secondary | ICD-10-CM

## 2020-02-18 DIAGNOSIS — Z87891 Personal history of nicotine dependence: Secondary | ICD-10-CM

## 2020-02-18 DIAGNOSIS — K219 Gastro-esophageal reflux disease without esophagitis: Secondary | ICD-10-CM | POA: Diagnosis not present

## 2020-02-18 DIAGNOSIS — E559 Vitamin D deficiency, unspecified: Secondary | ICD-10-CM | POA: Diagnosis not present

## 2020-02-18 DIAGNOSIS — E782 Mixed hyperlipidemia: Secondary | ICD-10-CM

## 2020-02-18 DIAGNOSIS — R6889 Other general symptoms and signs: Secondary | ICD-10-CM

## 2020-02-18 DIAGNOSIS — Z23 Encounter for immunization: Secondary | ICD-10-CM

## 2020-02-18 DIAGNOSIS — R7309 Other abnormal glucose: Secondary | ICD-10-CM | POA: Diagnosis not present

## 2020-02-18 DIAGNOSIS — Z0001 Encounter for general adult medical examination with abnormal findings: Secondary | ICD-10-CM

## 2020-02-18 DIAGNOSIS — Z0000 Encounter for general adult medical examination without abnormal findings: Secondary | ICD-10-CM

## 2020-02-18 DIAGNOSIS — Z9989 Dependence on other enabling machines and devices: Secondary | ICD-10-CM

## 2020-02-18 DIAGNOSIS — G4733 Obstructive sleep apnea (adult) (pediatric): Secondary | ICD-10-CM | POA: Diagnosis not present

## 2020-02-18 MED ORDER — SILDENAFIL CITRATE 100 MG PO TABS: ORAL_TABLET | ORAL | 0 refills | Status: DC

## 2020-02-18 NOTE — Addendum Note (Signed)
Addended by: Chancy Hurter on: 02/18/2020 11:53 AM   Modules accepted: Orders

## 2020-02-18 NOTE — Patient Instructions (Addendum)
Jason Dennis , Thank you for taking time to come for your Medicare Wellness Visit. I appreciate your ongoing commitment to your health goals. Please review the following plan we discussed and let me know if I can assist you in the future.   These are the goals we discussed: Goals    . Blood Pressure < 130/80    . Exercise 150 min/wk Moderate Activity    . LDL CALC < 100    . Weight (lb) < 200 lb (90.7 kg)       This is a list of the screening recommended for you and due dates:  Health Maintenance  Topic Date Due  . Flu Shot  10/04/2019  . COVID-19 Vaccine (1) 03/05/2020*  . Colon Cancer Screening  05/02/2021  . Tetanus Vaccine  11/10/2024  .  Hepatitis C: One time screening is recommended by Center for Disease Control  (CDC) for  adults born from [birth year] through 1965.   Completed  . Pneumonia vaccines  Completed  *Topic was postponed. The date shown is not the original due date.      Recommend trying water aerobics or other lower body muscle building activity prior to knee surgery      Preparing for Knee Replacement Getting prepared before knee replacement surgery can make your recovery easier and more comfortable. This document provides some tips and guidelines that will help you prepare for your surgery. Talk with your health care provider so you can learn what to expect before, during, and after surgery. Ask questions if you do not understand something. To ease concerns about your financial responsibilities, call your insurance company as soon as you decide to have surgery. Ask how much of your surgery and hospital stay will be covered. Also ask about coverage for medical equipment, rehabilitation facilities, and home care. How should I arrange for help? In the first couple weeks after surgery, it will likely be harder for you to do some of your regular activities. You may get tired easily, and you will have limited movement in your leg. Follow these guidelines to make sure you  have all the help you need after your surgery:  Plan to have someone take you home from the hospital. Your health care provider will tell you how many days you can expect to be in the hospital.  Cancel all your work, caregiving, and volunteer responsibilities for at least 4-6 weeks after your surgery.  Plan to have someone stay with you day and night for the first week. This person should be someone you are comfortable with. You may need this person to help you with your exercises and personal care, such as bathing and using the toilet.  If you live alone, arrange for someone to take care of your home and pets for the first 4-6 weeks after surgery.  Arrange for drivers to take you to and from follow-up appointments, the grocery store, and other places you may need to go for at least 4-6 weeks.  Consider applying for a disabled parking permit. To get an application, contact the Department of Motor Vehicles or your health care provider's office. How should I prepare my home?      Pick a recovery spot, but do not plan on recovering in bed. Sitting upright is better for your health. You may want to use a recliner with a small table nearby. Place the items you use most frequently on that table. These may include the TV remote, a cordless phone, a cell  phone, a book or laptop computer, and a water glass.  To see if you will be able to move around in your home with a walker, hold your hands out about 6 inches (15 cm) from your sides, and walk from your recovery spot to your kitchen and bathroom. Then walk from your bed to the bathroom. If you do not hit anything with your hands, you will have enough room for a walker.  Minimize the use of stairs after you return home to reduce your risk of falling or tripping.  Remove all clutter from your floors. Also remove any throw rugs. This will help you avoid tripping after your surgery.  Move the items you use most often in your kitchen, bathroom, and  bedroom to shelves and drawers that are at countertop height.  Prepare a few meals to freeze and reheat later.  Consider getting safety equipment that will be helpful during your recovery, such as: ? Grab bars added in the shower and near the toilet. ? A raised toilet seat to help you get on and off the toilet more easily. ? A tub or shower bench. How should I prepare my body?  Have a preoperative exam. ? During the exam, your health care provider will make sure that your body is healthy enough to safely have the surgery. ? When you go to the exam, bring a complete list of all your medicines and supplements, including herbs and vitamins. ? You may need to have additional tests to ensure your safety.  Have elective dental care and routine cleanings done before your surgery. Germs from anywhere in your body, including your mouth, can travel to your new joint and infect it. It is important that you do not have any dental work done for at least 3 months after your surgery.  Maintain a healthy diet. Do not change your diet before surgery unless your health care provider tells you to do that.  Do not use any products that contain nicotine or tobacco, such as cigarettes and e-cigarettes. These can delay bone healing after surgery. If you need help quitting, ask your health care provider.  Tell your health care provider if: ? You develop any skin infections or skin irritations. You may need to improve the condition of your skin before surgery. ? You have a fever, a cold, or any other illness in the week before your surgery.  Do not drink any alcohol for at least 48 hours before surgery.  The day before your surgery, follow instructions from your health care provider about showering, eating, drinking, and taking medicines. These directions are for your safety.  Talk to your health care provider about doing exercises before your surgery. ? Be sure to follow the exercise program only as directed  by your health care provider. ? Doing these exercises in the weeks before your surgery may help reduce pain and improve function after surgery. Summary  Getting prepared before knee replacement surgery can make your recovery easier and more comfortable.  Prepare your home and arrange for help at home.  Keep all of your preoperative appointments to ensure that you are ready for your surgery.  Plan to have someone take you home from the hospital and stay with you day and night for the first week. This information is not intended to replace advice given to you by your health care provider. Make sure you discuss any questions you have with your health care provider. Document Revised: 04/08/2017 Document Reviewed: 04/08/2017 Elsevier Patient  Education  2020 [employer]  

## 2020-02-19 LAB — CBC WITH DIFFERENTIAL/PLATELET
Absolute Monocytes: 443 cells/uL (ref 200–950)
Basophils Absolute: 30 cells/uL (ref 0–200)
Basophils Relative: 0.7 %
Eosinophils Absolute: 292 cells/uL (ref 15–500)
Eosinophils Relative: 6.8 %
HCT: 42.9 % (ref 38.5–50.0)
Hemoglobin: 14.2 g/dL (ref 13.2–17.1)
Lymphs Abs: 946 cells/uL (ref 850–3900)
MCH: 30.1 pg (ref 27.0–33.0)
MCHC: 33.1 g/dL (ref 32.0–36.0)
MCV: 90.9 fL (ref 80.0–100.0)
MPV: 10 fL (ref 7.5–12.5)
Monocytes Relative: 10.3 %
Neutro Abs: 2589 cells/uL (ref 1500–7800)
Neutrophils Relative %: 60.2 %
Platelets: 252 10*3/uL (ref 140–400)
RBC: 4.72 10*6/uL (ref 4.20–5.80)
RDW: 12.3 % (ref 11.0–15.0)
Total Lymphocyte: 22 %
WBC: 4.3 10*3/uL (ref 3.8–10.8)

## 2020-02-19 LAB — COMPLETE METABOLIC PANEL WITH GFR
AG Ratio: 2.1 (calc) (ref 1.0–2.5)
ALT: 35 U/L (ref 9–46)
AST: 23 U/L (ref 10–35)
Albumin: 4.5 g/dL (ref 3.6–5.1)
Alkaline phosphatase (APISO): 104 U/L (ref 35–144)
BUN: 17 mg/dL (ref 7–25)
CO2: 25 mmol/L (ref 20–32)
Calcium: 9.9 mg/dL (ref 8.6–10.3)
Chloride: 106 mmol/L (ref 98–110)
Creat: 0.91 mg/dL (ref 0.70–1.25)
GFR, Est African American: 99 mL/min/{1.73_m2} (ref >=60)
GFR, Est Non African American: 86 mL/min/{1.73_m2} (ref >=60)
Globulin: 2.1 g/dL (calc) (ref 1.9–3.7)
Glucose, Bld: 100 mg/dL — ABNORMAL HIGH (ref 65–99)
Potassium: 4.4 mmol/L (ref 3.5–5.3)
Sodium: 143 mmol/L (ref 135–146)
Total Bilirubin: 0.6 mg/dL (ref 0.2–1.2)
Total Protein: 6.6 g/dL (ref 6.1–8.1)

## 2020-02-19 LAB — LIPID PANEL
Cholesterol: 166 mg/dL (ref <200)
HDL: 54 mg/dL (ref >=40)
LDL Cholesterol (Calc): 83 mg/dL (calc)
Non-HDL Cholesterol (Calc): 112 mg/dL (calc) (ref <130)
Total CHOL/HDL Ratio: 3.1 (calc) (ref <5.0)
Triglycerides: 194 mg/dL — ABNORMAL HIGH (ref <150)

## 2020-02-19 LAB — TSH: TSH: 1.56 mIU/L (ref 0.40–4.50)

## 2020-02-19 LAB — MAGNESIUM: Magnesium: 1.9 mg/dL (ref 1.5–2.5)

## 2020-02-25 ENCOUNTER — Other Ambulatory Visit: Payer: Self-pay | Admitting: Internal Medicine

## 2020-02-25 DIAGNOSIS — E669 Obesity, unspecified: Secondary | ICD-10-CM

## 2020-03-11 DIAGNOSIS — G4733 Obstructive sleep apnea (adult) (pediatric): Secondary | ICD-10-CM | POA: Diagnosis not present

## 2020-04-11 ENCOUNTER — Other Ambulatory Visit: Payer: Self-pay | Admitting: Internal Medicine

## 2020-04-11 DIAGNOSIS — G4733 Obstructive sleep apnea (adult) (pediatric): Secondary | ICD-10-CM | POA: Diagnosis not present

## 2020-04-25 ENCOUNTER — Ambulatory Visit: Payer: Medicare HMO | Admitting: Internal Medicine

## 2020-05-04 ENCOUNTER — Other Ambulatory Visit: Payer: Self-pay | Admitting: Adult Health

## 2020-05-09 DIAGNOSIS — G4733 Obstructive sleep apnea (adult) (pediatric): Secondary | ICD-10-CM | POA: Diagnosis not present

## 2020-05-25 ENCOUNTER — Ambulatory Visit (INDEPENDENT_AMBULATORY_CARE_PROVIDER_SITE_OTHER): Payer: Medicare HMO | Admitting: Adult Health Nurse Practitioner

## 2020-05-25 ENCOUNTER — Other Ambulatory Visit: Payer: Self-pay

## 2020-05-25 ENCOUNTER — Encounter: Payer: Self-pay | Admitting: Adult Health Nurse Practitioner

## 2020-05-25 VITALS — BP 140/72 | HR 102 | Temp 97.5°F | Wt 209.0 lb

## 2020-05-25 DIAGNOSIS — Z79899 Other long term (current) drug therapy: Secondary | ICD-10-CM

## 2020-05-25 DIAGNOSIS — E559 Vitamin D deficiency, unspecified: Secondary | ICD-10-CM | POA: Diagnosis not present

## 2020-05-25 DIAGNOSIS — Z9989 Dependence on other enabling machines and devices: Secondary | ICD-10-CM

## 2020-05-25 DIAGNOSIS — E782 Mixed hyperlipidemia: Secondary | ICD-10-CM

## 2020-05-25 DIAGNOSIS — Z87891 Personal history of nicotine dependence: Secondary | ICD-10-CM | POA: Diagnosis not present

## 2020-05-25 DIAGNOSIS — K219 Gastro-esophageal reflux disease without esophagitis: Secondary | ICD-10-CM | POA: Diagnosis not present

## 2020-05-25 DIAGNOSIS — R7309 Other abnormal glucose: Secondary | ICD-10-CM

## 2020-05-25 DIAGNOSIS — G4733 Obstructive sleep apnea (adult) (pediatric): Secondary | ICD-10-CM

## 2020-05-25 DIAGNOSIS — I10 Essential (primary) hypertension: Secondary | ICD-10-CM

## 2020-05-25 DIAGNOSIS — R202 Paresthesia of skin: Secondary | ICD-10-CM

## 2020-05-25 NOTE — Progress Notes (Signed)
FOLLOW UP 3 MONTH  Assessment:   Essential hypertension - Lifestyle controlled  DASH diet, exercise and monitor at home. Call if greater than 130/80.  -     CBC with Differential/Platelet -     CMP/GFR -     TSH  OSA on CPAP Weight loss advised, continue CPAP Continue CPAP/BiPAP, using nightly for at least 8 hours  Helping with daytime fatigue Weight loss still advised Discussed mask & tubing hygeine  Mixed hyperlipidemia Continue medications: Atorvastatin 80mg  Discussed dietary and exercise modifications Low fat diet -     Lipid panel  Abnormal glucose Discussed general issues about diabetes pathophysiology and management., Educational material distributed., Suggested low cholesterol diet., Encouraged aerobic exercise., Discussed foot care., Reminded to get yearly retinal exam.  Gastroesophageal reflux disease, esophagitis presence not specified Continue PPI/H2 blocker, diet discussed  Vitamin D deficiency Continue supplement  Morbid obesity ([place]) - BMI 30 + with OSA - long discussion about weight loss, diet, and exercise  Former smoker (32pk, quit 1991) Monitor via CT Discussed with patient   Paresthesia of both feet -     Vitamin B12 Discussed monitor diet, ETOH and sugar intake  Medication management Continued    Further disposition pending results if labs check today. Discussed med's effects and SE's.   Over 20 minutes of face to face interview, exam, counseling, chart review, and critical decision making was performed.    Future Appointments  Date Time Provider [place]  05/25/2020  3:00 PM Garnet Sierras, NP [facility] None  10/25/2020 10:00 AM [name], MD [facility] None  02/20/2021 10:30 AM Liane Comber, NP [facility] None      Subjective:  Neri Samek is a 70 y.o. male who presents for Medicare Annual Wellness Visit and 3 month follow up for HTN, hyperlipidemia, glucose, and vitamin D Def.  He reports chronic right  knee pain, previously getting injections by ortho ([facility] - Dr. Mayer Camel) and planning to have replaced in the next year. Also hx L shoulder impingement, saw Dr. Erlinda Hong and had an injction 90 days ago. Takes 800 mg ibuprofen in the AM prior to work, takes tylenol later in the day, also uses topical CBD.  Report he knows he will need another injection and poassibility of replacement.  Keeps his granddaughter [name].  BMI is Body mass index is 29.99 kg/m., he is not currently working on diet and exercise.  He has started back on the phentermine to help with weight loss.  He is down 3lbs from last OV.   He is on CPAP for OSA.   Trying to walk for exercise.  Wt Readings from Last 3 Encounters:  05/25/20 209 lb (94.8 kg)  02/18/20 212 lb (96.2 kg)  10/13/19 207 lb 9.6 oz (94.2 kg)    His blood pressure has been controlled at home, today their BP is BP: 140/72   He does workout. He denies chest pain, shortness of breath, dizziness.   He is on cholesterol medication and denies myalgias. His cholesterol is at goal. The cholesterol last visit was:   Lab Results  Component Value Date   CHOL 166 02/18/2020   HDL 54 02/18/2020   LDLCALC 83 02/18/2020   TRIG 194 (H) 02/18/2020   CHOLHDL 3.1 02/18/2020   He does have diet controlled prediabetes.  He reports that he is doing well with diet and exercise.   Lab Results  Component Value Date   HGBA1C 5.5 10/13/2019    Last GFR Lab Results  Component Value Date   GFRNONAA 86 02/18/2020    Patient is on Vitamin D supplement 5,000IU daily. Lab Results  Component Value Date   VD25OH 49 10/13/2019       Medication Review: Current Outpatient Medications on File Prior to Visit  Medication Sig Dispense Refill  . atorvastatin (LIPITOR) 80 MG tablet TAKE 1 TABLET BY MOUTH ONCE DAILY FOR CHOLESTEROL 90 tablet 1  . cholecalciferol (VITAMIN D) 1000 UNITS tablet Take 5,000 Units by mouth daily.     . cyclobenzaprine (FLEXERIL) 10 MG tablet Take  1/2 to 1 tablet 3 x /day if needed for Muscle Spasm 30 tablet 0  . Omega-3 Fatty Acids (FISH OIL) 1000 MG CAPS Take by mouth.    Marland Kitchen omeprazole (PRILOSEC) 20 MG capsule Take 20 mg by mouth daily.    . phentermine (ADIPEX-P) 37.5 MG tablet TAKE 1/2 TO 1 (ONE-HALF TO ONE) TABLET BY MOUTH IN THE MORNING FOR  DIETING  AND  WEIGHT  LOSS 30 tablet 2  . sildenafil (VIAGRA) 100 MG tablet TAKE 1/2 TO 1 TABLET BY MOUTH DAILY AS NEEDED 30 tablet 0   No current facility-administered medications on file prior to visit.    Allergies: No Known Allergies  Current Problems (verified) has Hyperlipidemia, mixed; Hypertension; Abnormal glucose; Vitamin D deficiency; Overweight (BMI 25.0-29.9); OSA on CPAP; GERD (gastroesophageal reflux disease); Medication management; Tear of left supraspinatus tendon; Impingement syndrome of left shoulder; Tendinopathy of left biceps; FHx: heart disease; Former smoker (32 pack year, quit 1991); and Morbid obesity ([place]) on their problem list.  Screening Tests Immunization History  Administered Date(s) Administered  . Influenza Inj Mdck Quad With Preservative 02/18/2020  . Influenza, High Dose Seasonal PF 10/21/2015, 12/24/2016, 01/10/2018, 11/20/2018  . PPD Test 11/10/2013, 11/11/2014  . Pneumococcal Conjugate-13 10/21/2015  . Pneumococcal Polysaccharide-23 05/31/2016  . Pneumococcal-Unspecified 04/15/2009  . Td 11/17/2004  . Tdap 11/11/2014  . Zoster 10/28/2012    Preventative care: Last colonoscopy: 04/2016 due 2023  Prior vaccinations: TD or Tdap: 2016  Influenza: 2021  Pneumococcal: 2018 Prevnar13: 2017 Shingles/Zostavax: 2014 Covid 19: declines   Names of Other Physician/Practitioners you currently use: 1. [facility] here for primary care 2. Dr. Delman Cheadle, eye doctor, last visit 2021, may scheduled for 2022 3. Dr. Aggie Moats, dentist, last visit 2022, q40m  Patient Care Team: [name], MD as PCP -  General  Surgical: He  has a past surgical history that includes Spine surgery. Family His family history includes COPD in his mother; Colon cancer in his father; Diabetes in his brother; Heart disease in his mother; Heart failure in his father and mother; Hypertension in his mother. Social history  He reports that he quit smoking about 30 years ago. His smoking use included cigarettes. He has a 32.00 pack-year smoking history. He has never used smokeless tobacco. He reports current alcohol use of about 14.0 standard drinks of alcohol per week. He reports that he does not use drugs.   Objective:   Today's Vitals   05/25/20 1449  BP: 140/72  Pulse: (!) 102  Temp: (!) 97.5 F (36.4 C)  SpO2: 96%  Weight: 209 lb (94.8 kg)   Body mass index is 29.99 kg/m.  General appearance: alert, no distress, WD/WN, male HEENT: normocephalic, sclerae anicteric, TMs pearly, nares patent, no discharge or erythema, pharynx normal Oral cavity: MMM, no lesions Neck: supple, no lymphadenopathy, no thyromegaly, no masses Heart: RRR, normal S1, S2, no murmurs Lungs: CTA bilaterally, no wheezes,  rhonchi, or rales Abdomen: +bs, soft, non tender, non distended, no masses, no hepatomegaly, no splenomegaly Musculoskeletal: nontender, no swelling, no obvious deformity Extremities: no edema, no cyanosis, no clubbing Pulses: 2+ symmetric, upper and lower extremities, normal cap refill Neurological: alert, oriented x 3, CN2-12 intact, strength normal upper extremities and lower extremities, sensation normal throughout, DTRs 2+ throughout, no cerebellar signs, gait normal Psychiatric: normal affect, behavior normal, pleasant    Garnet Sierras, Laqueta Jean, DNP [facility] 05/25/2020  2:53 PM

## 2020-05-26 LAB — LIPID PANEL
Cholesterol: 146 mg/dL (ref <200)
HDL: 46 mg/dL (ref >=40)
LDL Cholesterol (Calc): 67 mg/dL (calc)
Non-HDL Cholesterol (Calc): 100 mg/dL (calc) (ref <130)
Total CHOL/HDL Ratio: 3.2 (calc) (ref <5.0)
Triglycerides: 238 mg/dL — ABNORMAL HIGH (ref <150)

## 2020-05-26 LAB — CBC WITH DIFFERENTIAL/PLATELET
Absolute Monocytes: 534 cells/uL (ref 200–950)
Basophils Absolute: 29 cells/uL (ref 0–200)
Basophils Relative: 0.5 %
Eosinophils Absolute: 331 cells/uL (ref 15–500)
Eosinophils Relative: 5.7 %
HCT: 42.7 % (ref 38.5–50.0)
Hemoglobin: 14.1 g/dL (ref 13.2–17.1)
Lymphs Abs: 795 cells/uL — ABNORMAL LOW (ref 850–3900)
MCH: 31 pg (ref 27.0–33.0)
MCHC: 33 g/dL (ref 32.0–36.0)
MCV: 93.8 fL (ref 80.0–100.0)
MPV: 9.9 fL (ref 7.5–12.5)
Monocytes Relative: 9.2 %
Neutro Abs: 4112 cells/uL (ref 1500–7800)
Neutrophils Relative %: 70.9 %
Platelets: 237 10*3/uL (ref 140–400)
RBC: 4.55 10*6/uL (ref 4.20–5.80)
RDW: 11.9 % (ref 11.0–15.0)
Total Lymphocyte: 13.7 %
WBC: 5.8 10*3/uL (ref 3.8–10.8)

## 2020-05-26 LAB — COMPLETE METABOLIC PANEL WITH GFR
AG Ratio: 2.4 (calc) (ref 1.0–2.5)
ALT: 34 U/L (ref 9–46)
AST: 21 U/L (ref 10–35)
Albumin: 4.5 g/dL (ref 3.6–5.1)
Alkaline phosphatase (APISO): 85 U/L (ref 35–144)
BUN: 22 mg/dL (ref 7–25)
CO2: 25 mmol/L (ref 20–32)
Calcium: 9.8 mg/dL (ref 8.6–10.3)
Chloride: 107 mmol/L (ref 98–110)
Creat: 1.13 mg/dL (ref 0.70–1.25)
GFR, Est African American: 76 mL/min/{1.73_m2} (ref >=60)
GFR, Est Non African American: 66 mL/min/{1.73_m2} (ref >=60)
Globulin: 1.9 g/dL (calc) (ref 1.9–3.7)
Glucose, Bld: 104 mg/dL — ABNORMAL HIGH (ref 65–99)
Potassium: 4.4 mmol/L (ref 3.5–5.3)
Sodium: 140 mmol/L (ref 135–146)
Total Bilirubin: 0.6 mg/dL (ref 0.2–1.2)
Total Protein: 6.4 g/dL (ref 6.1–8.1)

## 2020-05-26 LAB — VITAMIN B12: Vitamin B-12: 355 pg/mL (ref 200–1100)

## 2020-06-13 DIAGNOSIS — G4733 Obstructive sleep apnea (adult) (pediatric): Secondary | ICD-10-CM | POA: Diagnosis not present

## 2020-06-15 ENCOUNTER — Telehealth: Payer: Self-pay

## 2020-06-15 ENCOUNTER — Other Ambulatory Visit: Payer: Self-pay | Admitting: Adult Health

## 2020-06-15 MED ORDER — IBUPROFEN 800 MG PO TABS: ORAL_TABLET | ORAL | 0 refills | Status: DC

## 2020-06-15 NOTE — Telephone Encounter (Signed)
Patient is requesting a prescription for 800mg  of Ibuprofen. "He reports chronic right knee pain, previously getting injections by ortho ([facility] - Dr. Mayer Camel) and planning to have replaced in the next year. Also hx L shoulder impingement, saw Dr. Erlinda Hong and had an injction 90 days ago. Takes 800 mg ibuprofen in the AM prior to work, takes tylenol later in the day, also uses topical CBD.  Report he knows he will need another injection and poassibility of replacement." - K. McClanahan

## 2020-06-21 DIAGNOSIS — M1711 Unilateral primary osteoarthritis, right knee: Secondary | ICD-10-CM | POA: Diagnosis not present

## 2020-06-21 DIAGNOSIS — M1712 Unilateral primary osteoarthritis, left knee: Secondary | ICD-10-CM | POA: Diagnosis not present

## 2020-06-22 ENCOUNTER — Other Ambulatory Visit: Payer: Self-pay | Admitting: Adult Health

## 2020-06-22 DIAGNOSIS — E669 Obesity, unspecified: Secondary | ICD-10-CM

## 2020-06-24 ENCOUNTER — Other Ambulatory Visit: Payer: Self-pay | Admitting: Internal Medicine

## 2020-06-28 DIAGNOSIS — M1712 Unilateral primary osteoarthritis, left knee: Secondary | ICD-10-CM | POA: Diagnosis not present

## 2020-06-28 DIAGNOSIS — M1711 Unilateral primary osteoarthritis, right knee: Secondary | ICD-10-CM | POA: Diagnosis not present

## 2020-07-05 DIAGNOSIS — M1711 Unilateral primary osteoarthritis, right knee: Secondary | ICD-10-CM | POA: Diagnosis not present

## 2020-07-05 DIAGNOSIS — M1712 Unilateral primary osteoarthritis, left knee: Secondary | ICD-10-CM | POA: Diagnosis not present

## 2020-07-12 DIAGNOSIS — H524 Presbyopia: Secondary | ICD-10-CM | POA: Diagnosis not present

## 2020-07-12 DIAGNOSIS — H02403 Unspecified ptosis of bilateral eyelids: Secondary | ICD-10-CM | POA: Diagnosis not present

## 2020-07-12 DIAGNOSIS — H52203 Unspecified astigmatism, bilateral: Secondary | ICD-10-CM | POA: Diagnosis not present

## 2020-07-12 DIAGNOSIS — H2513 Age-related nuclear cataract, bilateral: Secondary | ICD-10-CM | POA: Diagnosis not present

## 2020-07-13 DIAGNOSIS — G4733 Obstructive sleep apnea (adult) (pediatric): Secondary | ICD-10-CM | POA: Diagnosis not present

## 2020-07-21 ENCOUNTER — Other Ambulatory Visit: Payer: Self-pay | Admitting: Adult Health

## 2020-07-24 ENCOUNTER — Other Ambulatory Visit: Payer: Self-pay | Admitting: Adult Health

## 2020-08-13 DIAGNOSIS — G4733 Obstructive sleep apnea (adult) (pediatric): Secondary | ICD-10-CM | POA: Diagnosis not present

## 2020-09-13 ENCOUNTER — Encounter: Payer: Medicare HMO | Admitting: Internal Medicine

## 2020-09-15 DIAGNOSIS — G4733 Obstructive sleep apnea (adult) (pediatric): Secondary | ICD-10-CM | POA: Diagnosis not present

## 2020-09-19 ENCOUNTER — Other Ambulatory Visit: Payer: Self-pay | Admitting: Adult Health

## 2020-10-12 ENCOUNTER — Encounter: Payer: Medicare HMO | Admitting: Internal Medicine

## 2020-10-14 ENCOUNTER — Other Ambulatory Visit: Payer: Self-pay | Admitting: Adult Health

## 2020-10-16 DIAGNOSIS — G4733 Obstructive sleep apnea (adult) (pediatric): Secondary | ICD-10-CM | POA: Diagnosis not present

## 2020-10-24 ENCOUNTER — Encounter: Payer: Self-pay | Admitting: Internal Medicine

## 2020-10-24 NOTE — Patient Instructions (Addendum)

## 2020-10-24 NOTE — Progress Notes (Signed)
Annual  Screening/Preventative Visit  & Comprehensive Evaluation & Examination  Future Appointments  Date Time Provider [place]  10/25/2020  - CPE 10:00 AM [name], MD [facility] None  02/20/2021  - Wellness 10:30 AM Magda Bernheim, NP [facility] None  10/25/2021  - CPE  10:00 AM [name], MD [facility] None            Jason very nice 70 y.o. Dennis presents for a Screening /Preventative Visit & comprehensive evaluation and management of multiple medical co-morbidities.  Patient has been followed for HTN, HLD, Prediabetes and Vitamin D Deficiency.   Patient is on CPAP for OSA with improved restorative sleep. Patient has GERD controlled with his diet & meds.      HTN predates circa 2016. Patient's BP has been controlled at home.  Today's BP is at goal - 136/78.  In 2018 , patient had a normal cardiac Myoview.  Patient denies any cardiac symptoms as chest pain, palpitations, shortness of breath, dizziness or ankle swelling.       Patient's hyperlipidemia is controlled with diet and Atorvastatin. Patient denies myalgias or other medication SE's. Last lipids were at goal except elevated Trig's:  Lab Results  Component Value Date   CHOL 146 05/25/2020   HDL 46 05/25/2020   LDLCALC 67 05/25/2020   TRIG 238 (H) 05/25/2020   CHOLHDL 3.2 05/25/2020         Patient has hx/o prediabetes (A1c 5.9% /2011) and he has not had hypoglycemic symptoms, visual blurring, diabetic polys or paresthesias. Last A1c was normal & at goal:   Lab Results  Component Value Date   HGBA1C 5.5 10/13/2019          Finally, patient has history of Vitamin D Deficiency  ("31" / 2008) and last vitamin D was at goal:   Lab Results  Component Value Date   VD25OH 49 10/13/2019     Current Outpatient Medications on File Prior to Visit  Medication Sig   atorvastatin (LIPITOR) 80 MG tablet Take  1 tablet  Daily  for Cholesterol / Patient knows to take by mouth   cholecalciferol (VITAMIN D)  1000 UNITS tablet Take 5,000 Units by mouth daily.    cyclobenzaprine (FLEXERIL) 10 MG tablet TAKE 1/2 TO 1 (ONE-HALF TO ONE) TABLET BY MOUTH THREE TIMES DAILY AS NEEDED FOR  MUSCLE  SPASM   ibuprofen (ADVIL) 800 MG tablet Take  1 tablet  3 x /day  with Meals  as Needed for Pain & Inflamation   Omega-3 Fatty Acids (FISH OIL) 1000 MG CAPS Take by mouth.   omeprazole (PRILOSEC) 20 MG capsule Take 20 mg by mouth daily.   phentermine (ADIPEX-P) 37.5 MG tablet TAKE 1/2 TO 1 (ONE-HALF TO ONE) TABLET BY MOUTH IN THE MORNING FOR  DIETING  AND  WEIGHT  LOSS   sildenafil (VIAGRA) 100 MG tablet Take  1/2 to 1 tablet  Daily  if Needed for XXXX    No Known Allergies   Past Medical History:  Diagnosis Date   GERD (gastroesophageal reflux disease)    Hyperlipidemia    Obesity    Prediabetes    Sleep apnea    CPAP   Vitamin D deficiency      Health Maintenance  Topic Date Due   COVID-19 Vaccine (1) Never done   Zoster Vaccines- Shingrix (1 of 2) Never done   INFLUENZA VACCINE  10/03/2020   COLONOSCOPY (Pts 45-68yr Insurance coverage will need to be confirmed)  05/02/2021   TETANUS/TDAP  11/10/2024   Hepatitis C Screening  Completed   PNA vac Low Risk Adult  Completed   HPV VACCINES  Aged Out     Immunization History  Administered Date(s) Administered   Influenza Inj Mdck Quad With Preservative 02/18/2020   Influenza, High Dose Seasonal PF 10/21/2015, 12/24/2016, 01/10/2018, 11/20/2018   PPD Test 11/10/2013, 11/11/2014   Pneumococcal Conjugate-13 10/21/2015   Pneumococcal Polysaccharide-23 05/31/2016   Pneumococcal-Unspecified 04/15/2009   Td 11/17/2004   Tdap 11/11/2014   Zoster, Live 10/28/2012    Last Colon -   Past Surgical History:  Procedure Laterality Date   SPINE SURGERY     [phone]     Family History  Problem Relation Age of Onset   Hypertension Mother    Heart disease Mother    COPD Mother    Heart failure Mother    Heart failure Father    Colon cancer  Father        died at 16;pt unsure age,never knew father   Diabetes Brother     Social History   Socioeconomic History   Marital status: Married    Spouse name: Not on file   Number of children: Not on file  Occupational History   Not on file  Tobacco Use   Smoking status: Former    Packs/day: 1.00    Years: 32.00    Pack years: 32.00    Types: Cigarettes    Quit date: 02/03/1990    Years since quitting: 30.7   Smokeless tobacco: Never  Substance and Sexual Activity   Alcohol use: Yes    Alcohol/week: 14.0 standard drinks    Types: 14 Standard drinks or equivalent per week    Comment: 7 beers a week plus 1 mixed drink daily.   Drug use: No   Sexual activity: Not Currently      ROS Constitutional: Denies fever, chills, weight loss/gain, headaches, insomnia,  night sweats or change in appetite. Does c/o fatigue. Eyes: Denies redness, blurred vision, diplopia, discharge, itchy or watery eyes.  ENT: Denies discharge, congestion, post nasal drip, epistaxis, sore throat, earache, hearing loss, dental pain, Tinnitus, Vertigo, Sinus pain or snoring.  Cardio: Denies chest pain, palpitations, irregular heartbeat, syncope, dyspnea, diaphoresis, orthopnea, PND, claudication or edema Respiratory: denies cough, dyspnea, DOE, pleurisy, hoarseness, laryngitis or wheezing.  Gastrointestinal: Denies dysphagia, heartburn, reflux, water brash, pain, cramps, nausea, vomiting, bloating, diarrhea, constipation, hematemesis, melena, hematochezia, jaundice or hemorrhoids Genitourinary: Denies dysuria, frequency, discharge, hematuria or flank pain. Has urgency, nocturia x 2-3 & occasional hesitancy. Musculoskeletal: Denies arthralgia, myalgia, stiffness, Jt. Swelling, pain, limp or strain/sprain. Denies Falls. Skin: Denies puritis, rash, hives, warts, acne, eczema or change in skin lesion Neuro: No weakness, tremor, incoordination, spasms, paresthesia or pain Psychiatric: Denies confusion, memory  loss or sensory loss. Denies Depression. Endocrine: Denies change in weight, skin, hair change, nocturia, and paresthesia, diabetic polys, visual blurring or hyper / hypo glycemic episodes.  Heme/Lymph: No excessive bleeding, bruising or enlarged lymph nodes.   Physical Exam  BP 136/78   Pulse 84   Temp 97.8 F (36.6 C)   Resp 16   Ht '5\' 10"'$  (1.778 m)   Wt 217 lb 9.6 oz (98.7 kg)   SpO2 98%   BMI 31.22 kg/m   General Appearance: Well nourished and well groomed and in no apparent distress.  Eyes: PERRLA, EOMs, conjunctiva no swelling or erythema, normal fundi and vessels. Sinuses: No frontal/maxillary tenderness ENT/Mouth: EACs patent / TMs  nl. Nares clear without erythema, swelling, mucoid exudates. Oral hygiene is good. No erythema, swelling, or exudate. Tongue normal, non-obstructing. Tonsils not swollen or erythematous. Hearing normal.  Neck: Supple, thyroid not palpable. No bruits, nodes or JVD. Respiratory: Respiratory effort normal.  BS equal and clear bilateral without rales, rhonci, wheezing or stridor. Cardio: Heart sounds are normal with regular rate and rhythm and no murmurs, rubs or gallops. Peripheral pulses are normal and equal bilaterally & pedal pulses 3+/3+ bilaterally without edema. No aortic or femoral bruits. Chest: symmetric with normal excursions and percussion.  Abdomen: Soft, with Nl bowel sounds. Nontender, no guarding, rebound, hernias, masses, or organomegaly.  Lymphatics: Non tender without lymphadenopathy.  Musculoskeletal: Full ROM all peripheral extremities, joint stability, 5/5 strength, and normal gait. Skin: Warm and dry without rashes, lesions, cyanosis, clubbing or  ecchymosis.  Neuro: Cranial nerves intact, reflexes equal bilaterally. Normal muscle tone, no cerebellar symptoms. Sensation intact.  Pysch: Alert and oriented X 3 with normal affect, insight and judgment appropriate.   Assessment and Plan  1. Annual Preventative/Screening Exam     2. Essential hypertension  - EKG 12-Lead - [place], RETROPERITNL ABD,  LTD - Urinalysis, Routine w reflex microscopic - Microalbumin / creatinine urine ratio - CBC with Differential/Platelet - COMPLETE METABOLIC PANEL WITH GFR - Magnesium - TSH  3. Hyperlipidemia, mixed  - EKG 12-Lead - [place], RETROPERITNL ABD,  LTD - Lipid panel - TSH  4. Abnormal glucose  - EKG 12-Lead - [place], RETROPERITNL ABD,  LTD - Hemoglobin A1c - Insulin, random  5. Vitamin D deficiency  - VITAMIN D 25 Hydroxy   6. Gastroesophageal reflux disease  - CBC with Differential/Platelet  7. BPH with obstruction/lower urinary tract symptoms  - PSA  8. OSA on CPAP   9. Screening for colorectal cancer   10. Prostate cancer screening  - PSA  11. Screening for ischemic heart disease  - EKG 12-Lead  12. FHx: heart disease  - EKG 12-Lead - [place], RETROPERITNL ABD,  LTD  13. Former smoker  - EKG 12-Lead - [place], RETROPERITNL ABD,  LTD  14. Screening for AAA (aortic abdominal aneurysm)  - [place], RETROPERITNL ABD,  LTD  15. Medication management  - Urinalysis, Routine w reflex microscopic - Microalbumin / creatinine urine ratio - POC Hemoccult Bld/Stl  - CBC with Differential/Platelet - COMPLETE METABOLIC PANEL WITH GFR - Magnesium - Lipid panel - TSH - Hemoglobin A1c - Insulin, random - VITAMIN D 25 Hydroxy          Patient was counseled in prudent diet, weight control to achieve/maintain BMI less than 25, BP monitoring, regular exercise and medications as discussed.  Discussed med effects and SE's. Routine screening labs and tests as requested with regular follow-up as recommended. Over 40 minutes of exam, counseling, chart review and high complex critical decision making was performed   Kirtland Bouchard, MD

## 2020-10-25 ENCOUNTER — Other Ambulatory Visit: Payer: Self-pay

## 2020-10-25 ENCOUNTER — Ambulatory Visit (INDEPENDENT_AMBULATORY_CARE_PROVIDER_SITE_OTHER): Payer: Medicare HMO | Admitting: Internal Medicine

## 2020-10-25 ENCOUNTER — Encounter: Payer: Self-pay | Admitting: Internal Medicine

## 2020-10-25 VITALS — BP 136/78 | HR 84 | Temp 97.8°F | Resp 16 | Ht 70.0 in | Wt 217.6 lb

## 2020-10-25 DIAGNOSIS — N401 Enlarged prostate with lower urinary tract symptoms: Secondary | ICD-10-CM

## 2020-10-25 DIAGNOSIS — Z0000 Encounter for general adult medical examination without abnormal findings: Secondary | ICD-10-CM | POA: Diagnosis not present

## 2020-10-25 DIAGNOSIS — R7309 Other abnormal glucose: Secondary | ICD-10-CM

## 2020-10-25 DIAGNOSIS — Z87891 Personal history of nicotine dependence: Secondary | ICD-10-CM

## 2020-10-25 DIAGNOSIS — Z125 Encounter for screening for malignant neoplasm of prostate: Secondary | ICD-10-CM | POA: Diagnosis not present

## 2020-10-25 DIAGNOSIS — N419 Inflammatory disease of prostate, unspecified: Secondary | ICD-10-CM | POA: Diagnosis not present

## 2020-10-25 DIAGNOSIS — Z136 Encounter for screening for cardiovascular disorders: Secondary | ICD-10-CM

## 2020-10-25 DIAGNOSIS — K219 Gastro-esophageal reflux disease without esophagitis: Secondary | ICD-10-CM

## 2020-10-25 DIAGNOSIS — Z9989 Dependence on other enabling machines and devices: Secondary | ICD-10-CM

## 2020-10-25 DIAGNOSIS — G4733 Obstructive sleep apnea (adult) (pediatric): Secondary | ICD-10-CM

## 2020-10-25 DIAGNOSIS — N138 Other obstructive and reflux uropathy: Secondary | ICD-10-CM

## 2020-10-25 DIAGNOSIS — R3121 Asymptomatic microscopic hematuria: Secondary | ICD-10-CM | POA: Diagnosis not present

## 2020-10-25 DIAGNOSIS — Z0001 Encounter for general adult medical examination with abnormal findings: Secondary | ICD-10-CM

## 2020-10-25 DIAGNOSIS — Z8249 Family history of ischemic heart disease and other diseases of the circulatory system: Secondary | ICD-10-CM

## 2020-10-25 DIAGNOSIS — E782 Mixed hyperlipidemia: Secondary | ICD-10-CM

## 2020-10-25 DIAGNOSIS — I10 Essential (primary) hypertension: Secondary | ICD-10-CM

## 2020-10-25 DIAGNOSIS — Z79899 Other long term (current) drug therapy: Secondary | ICD-10-CM

## 2020-10-25 DIAGNOSIS — R972 Elevated prostate specific antigen [PSA]: Secondary | ICD-10-CM | POA: Diagnosis not present

## 2020-10-25 DIAGNOSIS — E559 Vitamin D deficiency, unspecified: Secondary | ICD-10-CM | POA: Diagnosis not present

## 2020-10-25 DIAGNOSIS — N429 Disorder of prostate, unspecified: Secondary | ICD-10-CM | POA: Diagnosis not present

## 2020-10-25 DIAGNOSIS — Z6830 Body mass index (BMI) 30.0-30.9, adult: Secondary | ICD-10-CM

## 2020-10-25 DIAGNOSIS — E661 Drug-induced obesity: Secondary | ICD-10-CM

## 2020-10-25 DIAGNOSIS — Z1211 Encounter for screening for malignant neoplasm of colon: Secondary | ICD-10-CM

## 2020-10-26 LAB — COMPLETE METABOLIC PANEL WITH GFR
AG Ratio: 2.6 (calc) — ABNORMAL HIGH (ref 1.0–2.5)
ALT: 34 U/L (ref 9–46)
AST: 19 U/L (ref 10–35)
Albumin: 4.4 g/dL (ref 3.6–5.1)
Alkaline phosphatase (APISO): 78 U/L (ref 35–144)
BUN: 23 mg/dL (ref 7–25)
CO2: 25 mmol/L (ref 20–32)
Calcium: 9.7 mg/dL (ref 8.6–10.3)
Chloride: 106 mmol/L (ref 98–110)
Creat: 0.89 mg/dL (ref 0.70–1.28)
Globulin: 1.7 g/dL (calc) — ABNORMAL LOW (ref 1.9–3.7)
Glucose, Bld: 90 mg/dL (ref 65–99)
Potassium: 4.6 mmol/L (ref 3.5–5.3)
Sodium: 138 mmol/L (ref 135–146)
Total Bilirubin: 0.5 mg/dL (ref 0.2–1.2)
Total Protein: 6.1 g/dL (ref 6.1–8.1)
eGFR: 92 mL/min/{1.73_m2} (ref >=60)

## 2020-10-26 LAB — CBC WITH DIFFERENTIAL/PLATELET
Absolute Monocytes: 542 cells/uL (ref 200–950)
Basophils Absolute: 51 cells/uL (ref 0–200)
Basophils Relative: 0.9 %
Eosinophils Absolute: 331 cells/uL (ref 15–500)
Eosinophils Relative: 5.8 %
HCT: 39.8 % (ref 38.5–50.0)
Hemoglobin: 13.6 g/dL (ref 13.2–17.1)
Lymphs Abs: 1146 cells/uL (ref 850–3900)
MCH: 31.6 pg (ref 27.0–33.0)
MCHC: 34.2 g/dL (ref 32.0–36.0)
MCV: 92.6 fL (ref 80.0–100.0)
MPV: 10.1 fL (ref 7.5–12.5)
Monocytes Relative: 9.5 %
Neutro Abs: 3631 cells/uL (ref 1500–7800)
Neutrophils Relative %: 63.7 %
Platelets: 245 10*3/uL (ref 140–400)
RBC: 4.3 10*6/uL (ref 4.20–5.80)
RDW: 12.1 % (ref 11.0–15.0)
Total Lymphocyte: 20.1 %
WBC: 5.7 10*3/uL (ref 3.8–10.8)

## 2020-10-26 LAB — VITAMIN D 25 HYDROXY (VIT D DEFICIENCY, FRACTURES): Vit D, 25-Hydroxy: 51 ng/mL (ref 30–100)

## 2020-10-26 LAB — URINALYSIS, ROUTINE W REFLEX MICROSCOPIC
Bilirubin Urine: NEGATIVE
Glucose, UA: NEGATIVE
Hgb urine dipstick: NEGATIVE
Ketones, ur: NEGATIVE
Leukocytes,Ua: NEGATIVE
Nitrite: NEGATIVE
Protein, ur: NEGATIVE
Specific Gravity, Urine: 1.021 (ref 1.001–1.035)
pH: 5.5 (ref 5.0–8.0)

## 2020-10-26 LAB — LIPID PANEL
Cholesterol: 146 mg/dL (ref <200)
HDL: 45 mg/dL (ref >=40)
LDL Cholesterol (Calc): 74 mg/dL (calc)
Non-HDL Cholesterol (Calc): 101 mg/dL (calc) (ref <130)
Total CHOL/HDL Ratio: 3.2 (calc) (ref <5.0)
Triglycerides: 168 mg/dL — ABNORMAL HIGH (ref <150)

## 2020-10-26 LAB — MAGNESIUM: Magnesium: 1.8 mg/dL (ref 1.5–2.5)

## 2020-10-26 LAB — PSA: PSA: 1.04 ng/mL (ref <=4.00)

## 2020-10-26 LAB — INSULIN, RANDOM: Insulin: 8.6 u[IU]/mL

## 2020-10-26 LAB — HEMOGLOBIN A1C
Hgb A1c MFr Bld: 5.3 % of total Hgb (ref <5.7)
Mean Plasma Glucose: 105 mg/dL
eAG (mmol/L): 5.8 mmol/L

## 2020-10-26 LAB — MICROALBUMIN / CREATININE URINE RATIO
Creatinine, Urine: 90 mg/dL (ref 20–320)
Microalb Creat Ratio: 2 mcg/mg creat (ref <30)
Microalb, Ur: 0.2 mg/dL

## 2020-10-26 LAB — TSH: TSH: 2.4 mIU/L (ref 0.40–4.50)

## 2020-10-26 NOTE — Progress Notes (Signed)
============================================================ -   Test results slightly outside the reference range are not unusual. If there is anything important, I will review this with you,  otherwise it is considered normal test values.  If you have further questions,  please do not hesitate to contact me at the office or via My Chart.  ============================================================ ============================================================  -  PSA - Low - Great  ============================================================ ============================================================  - Total Chol = 146     &    LDL Chol = 74   - - Both  Excellent   - Very low risk for Heart Attack  / Stroke ============================================================ ============================================================  -  A1c - Normal - No Diabetes - Great  ! ============================================================ ============================================================  - Vitamin D = 51 - Slightly Low   - Vitamin D goal is between 70-100.  ~~~~~~~~~~~~~~~~~~~~~~~~~~~~~~~~~~~~~~~~~~~~~~~~~~~~~~~~~~~ ~~~~~~~~~~~~~~~~~~~~~~~~~~~~~~~~~~~~~~~~~~~~~~~~~~~~~~~~~~~  - Please INCREASE your Vitamin D 5,000 unit capsule to                                                              take 2 capsules = 10,000 units  /day   ~~~~~~~~~~~~~~~~~~~~~~~~~~~~~~~~~~~~~~~~~~~~~~~~~~~~~~~~~~~ ~~~~~~~~~~~~~~~~~~~~~~~~~~~~~~~~~~~~~~~~~~~~~~~~~~~~~~~~~~~  - It is very important as a natural anti-inflammatory and helping the  immune system protect against viral infections, like the Covid-19   helping hair, skin, and nails, as well as reducing stroke and  heart attack risk.   - It helps your bones and helps with mood.  - It also decreases numerous cancer risks so please  take it as directed.   - Low Vit D is associated with a 200-300% higher risk for  CANCER   and 200-300%  higher risk for HEART   ATTACK  &  STROKE.    - It is also associated with higher death rate at younger ages,   autoimmune diseases like Rheumatoid arthritis, Lupus,  Multiple Sclerosis.     - Also many other serious conditions, like depression, Alzheimer's  Dementia, infertility, muscle aches, fatigue, fibromyalgia   - just to name a few. ============================================================ ============================================================  - All Else - CBC - Kidneys - Electrolytes - Liver - Magnesium & Thyroid    - all  Normal / OK  ============================================================ ============================================================

## 2020-11-10 DIAGNOSIS — M25561 Pain in right knee: Secondary | ICD-10-CM | POA: Diagnosis not present

## 2020-11-10 DIAGNOSIS — M25562 Pain in left knee: Secondary | ICD-10-CM | POA: Diagnosis not present

## 2020-11-10 DIAGNOSIS — M170 Bilateral primary osteoarthritis of knee: Secondary | ICD-10-CM | POA: Diagnosis not present

## 2020-11-14 ENCOUNTER — Ambulatory Visit (INDEPENDENT_AMBULATORY_CARE_PROVIDER_SITE_OTHER): Payer: Medicare HMO | Admitting: Internal Medicine

## 2020-11-14 ENCOUNTER — Other Ambulatory Visit: Payer: Self-pay

## 2020-11-14 ENCOUNTER — Other Ambulatory Visit: Payer: Self-pay | Admitting: Internal Medicine

## 2020-11-14 ENCOUNTER — Encounter: Payer: Self-pay | Admitting: Internal Medicine

## 2020-11-14 VITALS — BP 134/80 | HR 65 | Temp 97.5°F | Resp 16 | Ht 70.0 in | Wt 216.6 lb

## 2020-11-14 DIAGNOSIS — L820 Inflamed seborrheic keratosis: Secondary | ICD-10-CM | POA: Diagnosis not present

## 2020-11-14 DIAGNOSIS — D492 Neoplasm of unspecified behavior of bone, soft tissue, and skin: Secondary | ICD-10-CM | POA: Diagnosis not present

## 2020-11-14 DIAGNOSIS — R0989 Other specified symptoms and signs involving the circulatory and respiratory systems: Secondary | ICD-10-CM

## 2020-11-14 NOTE — Progress Notes (Signed)
Future Appointments  Date Time Provider [place]  02/20/2021 10:30 AM Magda Bernheim, NP [facility] None  10/25/2021 10:00 AM [name], MD [facility] None    History of Present Illness:    This very nice 70 yo MWM with labile HTN prevents for recheck and denies any c/o HA's , dizziness, CP, palpitations, postural dizziness, ankle edema. He also has concers re: a "growth"  on his Rt mid forehead that has been increasing in size.   Medications   Current Outpatient Medications (Cardiovascular):    atorvastatin (LIPITOR) 80 MG tablet, Take  1 tablet  Daily  for Cholesterol / Patient knows to take by mouth   sildenafil (VIAGRA) 100 MG tablet, Take  1/2 to 1 tablet  Daily  if Needed for XXXX   Current Outpatient Medications (Analgesics):    ibuprofen (ADVIL) 800 MG tablet, Take  1 tablet  3 x /day  with Meals  as Needed for Pain & Inflamation   Current Outpatient Medications (Other):    cholecalciferol (VITAMIN D) 1000 UNITS tablet, Take 5,000 Units by mouth daily.    cyclobenzaprine (FLEXERIL) 10 MG tablet, TAKE 1/2 TO 1 (ONE-HALF TO ONE) TABLET BY MOUTH THREE TIMES DAILY AS NEEDED FOR  MUSCLE  SPASM   Omega-3 Fatty Acids (FISH OIL) 1000 MG CAPS, Take by mouth.   omeprazole (PRILOSEC) 20 MG capsule, Take 20 mg by mouth daily.   phentermine (ADIPEX-P) 37.5 MG tablet, TAKE 1/2 TO 1 (ONE-HALF TO ONE) TABLET BY MOUTH IN THE MORNING FOR  DIETING  AND  WEIGHT  LOSS  Problem list He has Hyperlipidemia, mixed; Hypertension; Abnormal glucose; Vitamin D deficiency; Overweight (BMI 25.0-29.9); OSA on CPAP; GERD (gastroesophageal reflux disease); Medication management; Tear of left supraspinatus tendon; Impingement syndrome of left shoulder; Tendinopathy of left biceps; FHx: heart disease; Former smoker (32 pack year, quit 1991); and Morbid obesity ([place]) on their problem list.   Observations/Objective:  BP 134/80   Pulse 65   Temp (!) 97.5 F (36.4 C)   Resp 16   Ht '5\' 10"'$   (1.778 m)   Wt 216 lb 9.6 oz (98.2 kg)   SpO2 96%   BMI 31.08 kg/m   HEENT - WNL. Neck - supple.  Chest - Clear equal BS. Cor - Nl HS. RRR w/o sig M. MS- FROM w/o deformities.  Gait Nl. Neuro -  Nl w/o focal abnormalities. Skin - there is a 3-4  x 7-8 mm raised filamentous exophytic growth of his Rt mid forehead.   Procedure  ( CPT:  [id number] )      After informed consent & aseptic prep with alcohol the lesion was anesthetized with 1.0 ml of Marcaine 0.5% .  Then with a #10 scalpel the lesion was excised by shave excisional technique. The lesion was recovered & sent for pathology. Next the wound base was lightly hyfrecated ( setting 3-5 ) with the Hyfrecator. With hemostasis accomplished, the wound was covered with a sterile dressing. Patient was instructed in post-op care.  \  Assessment and Plan:  1. Labile hypertension  - continue expectant monitoring  2. Neoplasm of skin of face  - Excision to r/o SCC  - Disposition pending path results   Follow Up Instructions:       I discussed the assessment and treatment plan with the patient. The patient was provided an opportunity to ask questions and all were answered. The patient agreed with the plan and demonstrated an understanding of the instructions.  The patient was advised to call back or seek an in-person evaluation if the symptoms worsen or if the condition fails to improve as anticipated.   Kirtland Bouchard, MD

## 2020-11-14 NOTE — Addendum Note (Signed)
Addended by: [name] on: 11/14/2020 09:41 PM   Modules accepted: Orders

## 2020-11-16 DIAGNOSIS — M170 Bilateral primary osteoarthritis of knee: Secondary | ICD-10-CM | POA: Diagnosis not present

## 2020-11-16 DIAGNOSIS — M25561 Pain in right knee: Secondary | ICD-10-CM | POA: Diagnosis not present

## 2020-11-16 DIAGNOSIS — M25562 Pain in left knee: Secondary | ICD-10-CM | POA: Diagnosis not present

## 2020-11-17 NOTE — Progress Notes (Signed)
============================================================ ============================================================  -   Skin biopsy was benign & appears most likely was a wart.   ============================================================ ============================================================

## 2020-12-08 ENCOUNTER — Other Ambulatory Visit: Payer: Self-pay | Admitting: Internal Medicine

## 2020-12-08 MED ORDER — SILDENAFIL CITRATE 100 MG PO TABS: ORAL_TABLET | ORAL | 1 refills | Status: DC

## 2020-12-14 DIAGNOSIS — H43811 Vitreous degeneration, right eye: Secondary | ICD-10-CM | POA: Diagnosis not present

## 2020-12-14 DIAGNOSIS — G4733 Obstructive sleep apnea (adult) (pediatric): Secondary | ICD-10-CM | POA: Diagnosis not present

## 2020-12-14 DIAGNOSIS — H25811 Combined forms of age-related cataract, right eye: Secondary | ICD-10-CM | POA: Diagnosis not present

## 2020-12-14 DIAGNOSIS — H02403 Unspecified ptosis of bilateral eyelids: Secondary | ICD-10-CM | POA: Diagnosis not present

## 2020-12-22 ENCOUNTER — Ambulatory Visit (INDEPENDENT_AMBULATORY_CARE_PROVIDER_SITE_OTHER): Payer: Medicare HMO

## 2020-12-22 ENCOUNTER — Other Ambulatory Visit: Payer: Self-pay

## 2020-12-22 VITALS — Temp 97.6°F

## 2020-12-22 DIAGNOSIS — Z23 Encounter for immunization: Secondary | ICD-10-CM

## 2020-12-25 ENCOUNTER — Other Ambulatory Visit: Payer: Self-pay | Admitting: Adult Health

## 2020-12-25 DIAGNOSIS — E669 Obesity, unspecified: Secondary | ICD-10-CM

## 2021-01-01 ENCOUNTER — Other Ambulatory Visit: Payer: Self-pay | Admitting: Nurse Practitioner

## 2021-01-24 ENCOUNTER — Ambulatory Visit: Payer: Medicare HMO | Admitting: Adult Health

## 2021-02-15 NOTE — Progress Notes (Deleted)
ANNUAL MEDICARE WELLNESS VISIT AND FOLLOW UP Assessment:   Encounter for Medicare annual wellness exam 1 year  Essential hypertension - continue medications, DASH diet, exercise and monitor at home. Call if greater than 130/80.  -     CBC with Differential/Platelet -     CMP/GFR -     TSH  OSA on CPAP Weight loss advised, continue CPAP  Mixed hyperlipidemia -continue medications, check lipids, decrease fatty foods, increase activity.  -     Lipid panel  Abnormal glucose Discussed general issues about diabetes pathophysiology and management., Educational material distributed., Suggested low cholesterol diet., Encouraged aerobic exercise., Discussed foot care., Reminded to get yearly retinal exam.  Medication management -     Magnesium  Gastroesophageal reflux disease, esophagitis presence not specified Continue PPI/H2 blocker, diet discussed  Vitamin D deficiency Continue supplement  Morbid obesity ([place]) - BMI 30 + with OSA - long discussion about weight loss, diet, and exercise  Former smoker monitor   Over 30 minutes of exam, counseling, chart review, and critical decision making was performed  Future Appointments  Date Time Provider [place]  02/20/2021 10:30 AM Magda Bernheim, NP [facility] None  10/25/2021 10:00 AM [name], MD [facility] None     Plan:   During the course of the visit the patient was educated and counseled about appropriate screening and preventive services including:   Pneumococcal vaccine  Influenza vaccine Prevnar 13 Td vaccine Screening electrocardiogram Colorectal cancer screening Diabetes screening Glaucoma screening Nutrition counseling    Subjective:  Jason Dennis is a 70 y.o. male who presents for Medicare Annual Wellness Visit and 3 month follow up for HTN, hyperlipidemia, glucose, and vitamin D Def.  Keeps his granddaughter Jason Dennis  He reports chronic right knee pain, previously getting injections by  ortho ([facility] - Dr. Mayer Camel) and planning to have replaced in the next year. Also hx L shoulder impingement, saw Dr. Erlinda Hong and was discussing surgery but reports seems to have resolved. Takes 800 mg ibuprofen in the AM prior to work, takes tylenol later in the day, also uses topical CBD.   BMI is There is no height or weight on file to calculate BMI., he is not currently working on diet and exercise.  He is on CPAP for OSA. He is on phentermine, currently taking a break for a few months after having covid 19, was down to 199 lb. Trying to walk for exercise.  Wt Readings from Last 3 Encounters:  11/14/20 216 lb 9.6 oz (98.2 kg)  10/25/20 217 lb 9.6 oz (98.7 kg)  05/25/20 209 lb (94.8 kg)    His blood pressure has been controlled at home, today their BP is     He does workout. He denies chest pain, shortness of breath, dizziness.   He is on cholesterol medication and denies myalgias. His cholesterol is at goal. The cholesterol last visit was:   Lab Results  Component Value Date   CHOL 146 10/25/2020   HDL 45 10/25/2020   LDLCALC 74 10/25/2020   TRIG 168 (H) 10/25/2020   CHOLHDL 3.2 10/25/2020   He does have diet controlled prediabetes.  He reports that he is doing well with diet and exercise.   Lab Results  Component Value Date   HGBA1C 5.3 10/25/2020    Last GFR Lab Results  Component Value Date   GFRNONAA 66 05/25/2020    Patient is on Vitamin D supplement.   Lab Results  Component Value Date   VD25OH  51 10/25/2020       Medication Review: Current Outpatient Medications on File Prior to Visit  Medication Sig Dispense Refill   atorvastatin (LIPITOR) 80 MG tablet Take  1 tablet  Daily  for Cholesterol / Patient knows to take by mouth 90 tablet 3   cholecalciferol (VITAMIN D) 1000 UNITS tablet Take 5,000 Units by mouth daily.      cyclobenzaprine (FLEXERIL) 10 MG tablet Take 1/2 - 1 Tablet 3 x /day if needed for Muscle Spasm                                /            TAKE  1/2 TO 1 (ONE-HALF TO ONE) TABLET BY MOUTH 90 tablet 0   ibuprofen (ADVIL) 800 MG tablet Take  1 tablet  3 x /day  with Meals  as Needed for Pain & Inflamation 270 tablet 1   Omega-3 Fatty Acids (FISH OIL) 1000 MG CAPS Take by mouth.     omeprazole (PRILOSEC) 20 MG capsule Take 20 mg by mouth daily.     phentermine (ADIPEX-P) 37.5 MG tablet Take  1/2 to 1 tablet  every Morning for Dieting & Weight Loss                       /          TAKE 1/2 TO 1 (ONE-HALF TO ONE) TABLET BY MOUTH 90 tablet 0   sildenafil (VIAGRA) 100 MG tablet Take  1/2 to 1 tablet  Daily  if Needed for XXXX 30 tablet 1   No current facility-administered medications on file prior to visit.    Allergies: No Known Allergies  Current Problems (verified) has Hyperlipidemia, mixed; Hypertension; Abnormal glucose; Vitamin D deficiency; Overweight (BMI 25.0-29.9); OSA on CPAP; GERD (gastroesophageal reflux disease); Medication management; Tear of left supraspinatus tendon; Impingement syndrome of left shoulder; Tendinopathy of left biceps; FHx: heart disease; Former smoker (32 pack year, quit 1991); and Morbid obesity ([place]) on their problem list.  Screening Tests Immunization History  Administered Date(s) Administered   Influenza Inj Mdck Quad With Preservative 02/18/2020   Influenza, High Dose Seasonal PF 10/21/2015, 12/24/2016, 01/10/2018, 11/20/2018, 12/22/2020   PPD Test 11/10/2013, 11/11/2014   Pneumococcal Conjugate-13 10/21/2015   Pneumococcal Polysaccharide-23 05/31/2016   Pneumococcal-Unspecified 04/15/2009   Td 11/17/2004   Tdap 11/11/2014   Zoster, Live 10/28/2012    Preventative care: Last colonoscopy: 04/2016 due 2023  Prior vaccinations: TD or Tdap: 2016  Influenza: 2020, TODAY  Pneumococcal: 2018 Prevnar13: 2017 Shingles/Zostavax: 2014 Covid 19: declines   Names of Other Physician/Practitioners you currently use: 1. [facility] here for primary care 2. Dr.  Delman Cheadle, eye doctor, last visit 2020 3. Dr. Aggie Moats, dentist, last visit 2021, q87m  Patient Care Team: [name], MD as PCP - General  Surgical: He  has a past surgical history that includes Spine surgery. Family His family history includes COPD in his mother; Colon cancer in his father; Diabetes in his brother; Heart disease in his mother; Heart failure in his father and mother; Hypertension in his mother. Social history  He reports that he quit smoking about 31 years ago. His smoking use included cigarettes. He has a 32.00 pack-year smoking history. He has never used smokeless tobacco. He reports current alcohol use of about 14.0 standard drinks per week. He reports that  he does not use drugs.  MEDICARE WELLNESS OBJECTIVES: Physical activity:   Cardiac risk factors:   Depression/mood screen:   Depression screen [facility] 2/9 10/24/2020  Decreased Interest 0  Down, Depressed, Hopeless 0  PHQ - 2 Score 0    ADLs:  In your present state of health, do you have any difficulty performing the following activities: 10/24/2020 02/18/2020  Hearing? N Y  Comment - has hearing aids, not wearing  Vision? N N  Difficulty concentrating or making decisions? N N  Walking or climbing stairs? N N  Dressing or bathing? N N  Doing errands, shopping? N N  Some recent data might be hidden    Cognitive Testing  Alert? Yes  Normal Appearance?Yes  Oriented to person? Yes  Place? Yes   Time? Yes  Recall of three objects?  Yes  Can perform simple calculations? Yes  Displays appropriate judgment?Yes  Can read the correct time from a watch face?Yes  EOL planning:     Objective:   There were no vitals filed for this visit.  There is no height or weight on file to calculate BMI.  General appearance: alert, no distress, WD/WN, male HEENT: normocephalic, sclerae anicteric, TMs pearly, nares patent, no discharge or erythema, pharynx normal Oral cavity: MMM, no lesions Neck: supple, no  lymphadenopathy, no thyromegaly, no masses Heart: RRR, normal S1, S2, no murmurs Lungs: CTA bilaterally, no wheezes, rhonchi, or rales Abdomen: +bs, soft, non tender, non distended, no masses, no hepatomegaly, no splenomegaly Musculoskeletal: nontender, no swelling, no obvious deformity Extremities: no edema, no cyanosis, no clubbing Pulses: 2+ symmetric, upper and lower extremities, normal cap refill Neurological: alert, oriented x 3, CN2-12 intact, strength normal upper extremities and lower extremities, sensation normal throughout, DTRs 2+ throughout, no cerebellar signs, gait normal Psychiatric: normal affect, behavior normal, pleasant   Medicare Attestation I have personally reviewed: The patient's medical and social history Their use of alcohol, tobacco or illicit drugs Their current medications and supplements The patient's functional ability including ADLs,fall risks, home safety risks, cognitive, and hearing and visual impairment Diet and physical activities Evidence for depression or mood disorders  The patient's weight, height, BMI, and visual acuity have been recorded in the chart.  I have made referrals, counseling, and provided education to the patient based on review of the above and I have provided the patient with a written personalized care plan for preventive services.     Magda Bernheim, NP   02/15/2021

## 2021-02-16 DIAGNOSIS — M1711 Unilateral primary osteoarthritis, right knee: Secondary | ICD-10-CM | POA: Diagnosis not present

## 2021-02-16 DIAGNOSIS — M25562 Pain in left knee: Secondary | ICD-10-CM | POA: Diagnosis not present

## 2021-02-16 DIAGNOSIS — M170 Bilateral primary osteoarthritis of knee: Secondary | ICD-10-CM | POA: Diagnosis not present

## 2021-02-16 DIAGNOSIS — M25561 Pain in right knee: Secondary | ICD-10-CM | POA: Diagnosis not present

## 2021-02-20 ENCOUNTER — Ambulatory Visit: Payer: Self-pay | Admitting: Nurse Practitioner

## 2021-02-20 DIAGNOSIS — E782 Mixed hyperlipidemia: Secondary | ICD-10-CM

## 2021-02-20 DIAGNOSIS — Z79899 Other long term (current) drug therapy: Secondary | ICD-10-CM

## 2021-02-20 DIAGNOSIS — R7309 Other abnormal glucose: Secondary | ICD-10-CM

## 2021-02-20 DIAGNOSIS — I10 Essential (primary) hypertension: Secondary | ICD-10-CM

## 2021-02-20 DIAGNOSIS — G4733 Obstructive sleep apnea (adult) (pediatric): Secondary | ICD-10-CM

## 2021-02-20 DIAGNOSIS — K219 Gastro-esophageal reflux disease without esophagitis: Secondary | ICD-10-CM

## 2021-02-20 DIAGNOSIS — N138 Other obstructive and reflux uropathy: Secondary | ICD-10-CM

## 2021-02-20 DIAGNOSIS — E559 Vitamin D deficiency, unspecified: Secondary | ICD-10-CM

## 2021-02-20 DIAGNOSIS — Z87891 Personal history of nicotine dependence: Secondary | ICD-10-CM

## 2021-02-20 DIAGNOSIS — Z0000 Encounter for general adult medical examination without abnormal findings: Secondary | ICD-10-CM

## 2021-02-21 ENCOUNTER — Encounter: Payer: Self-pay | Admitting: Nurse Practitioner

## 2021-02-21 ENCOUNTER — Other Ambulatory Visit: Payer: Self-pay

## 2021-02-21 ENCOUNTER — Ambulatory Visit (INDEPENDENT_AMBULATORY_CARE_PROVIDER_SITE_OTHER): Payer: Medicare HMO | Admitting: Nurse Practitioner

## 2021-02-21 VITALS — BP 140/74 | HR 104 | Temp 97.7°F | Wt 213.0 lb

## 2021-02-21 DIAGNOSIS — M25561 Pain in right knee: Secondary | ICD-10-CM

## 2021-02-21 DIAGNOSIS — Z0000 Encounter for general adult medical examination without abnormal findings: Secondary | ICD-10-CM

## 2021-02-21 DIAGNOSIS — K219 Gastro-esophageal reflux disease without esophagitis: Secondary | ICD-10-CM | POA: Diagnosis not present

## 2021-02-21 DIAGNOSIS — Z79899 Other long term (current) drug therapy: Secondary | ICD-10-CM | POA: Diagnosis not present

## 2021-02-21 DIAGNOSIS — E782 Mixed hyperlipidemia: Secondary | ICD-10-CM | POA: Diagnosis not present

## 2021-02-21 DIAGNOSIS — I10 Essential (primary) hypertension: Secondary | ICD-10-CM | POA: Diagnosis not present

## 2021-02-21 DIAGNOSIS — Z87891 Personal history of nicotine dependence: Secondary | ICD-10-CM | POA: Diagnosis not present

## 2021-02-21 DIAGNOSIS — M25562 Pain in left knee: Secondary | ICD-10-CM

## 2021-02-21 DIAGNOSIS — E559 Vitamin D deficiency, unspecified: Secondary | ICD-10-CM

## 2021-02-21 DIAGNOSIS — G4733 Obstructive sleep apnea (adult) (pediatric): Secondary | ICD-10-CM

## 2021-02-21 DIAGNOSIS — M1712 Unilateral primary osteoarthritis, left knee: Secondary | ICD-10-CM | POA: Diagnosis not present

## 2021-02-21 DIAGNOSIS — R7309 Other abnormal glucose: Secondary | ICD-10-CM

## 2021-02-21 DIAGNOSIS — G8929 Other chronic pain: Secondary | ICD-10-CM

## 2021-02-21 DIAGNOSIS — Z9989 Dependence on other enabling machines and devices: Secondary | ICD-10-CM

## 2021-02-21 DIAGNOSIS — Z0001 Encounter for general adult medical examination with abnormal findings: Secondary | ICD-10-CM | POA: Diagnosis not present

## 2021-02-21 DIAGNOSIS — R6889 Other general symptoms and signs: Secondary | ICD-10-CM | POA: Diagnosis not present

## 2021-02-21 NOTE — Progress Notes (Signed)
ANNUAL MEDICARE WELLNESS VISIT AND FOLLOW UP Assessment:   Encounter for Medicare annual wellness exam 1 year  Essential hypertension - continue medications, DASH diet, exercise and monitor at home. Call if greater than 130/80.  -     CBC with Differential/Platelet -     CMP/GFR -     TSH  OSA on CPAP Weight loss advised, continue CPAP  Mixed hyperlipidemia -continue medications, check lipids, decrease fatty foods, increase activity.  -     Lipid panel  Abnormal glucose Discussed general issues about diabetes pathophysiology and management., Educational material distributed., Suggested low cholesterol diet., Encouraged aerobic exercise., Discussed foot care., Reminded to get yearly retinal exam.  Medication management -     Magnesium  Gastroesophageal reflux disease, esophagitis presence not specified Continue PPI/H2 blocker, diet discussed  Vitamin D deficiency Continue supplement  Morbid obesity ([place]) - BMI 30 + with OSA - long discussion about weight loss, diet, and exercise  Former smoker monitor  Chronic pain of both knees Continue to follow with [facility]ogenics, if symptoms worsen can refer to ortho for evaluation for replacement  Over 30 minutes of exam, counseling, chart review, and critical decision making was performed  Future Appointments  Date Time Provider [place]  10/25/2021 10:00 AM [name], MD [facility] None  02/21/2022  4:00 PM Magda Bernheim, NP [facility] None     Plan:   During the course of the visit the patient was educated and counseled about appropriate screening and preventive services including:   Pneumococcal vaccine  Influenza vaccine Prevnar 13 Td vaccine Screening electrocardiogram Colorectal cancer screening Diabetes screening Glaucoma screening Nutrition counseling    Subjective:  Jason Dennis is a 70 y.o. male who presents for Medicare Annual Wellness Visit and 3 month follow up for HTN, hyperlipidemia,  glucose, and vitamin D Def.   He reports chronic right knee pain, previously getting injections by ortho([facility]). Is getting plasma injections. He just got a shot in right knee yesterday and one in left knee today.   BMI is Body mass index is 30.56 kg/m., he is not currently working on diet and exercise.  He is on CPAP for OSA. He is on phentermine, for weight loss. Trying to walk for exercise.  Wt Readings from Last 3 Encounters:  02/21/21 213 lb (96.6 kg)  11/14/20 216 lb 9.6 oz (98.2 kg)  10/25/20 217 lb 9.6 oz (98.7 kg)    His blood pressure has been controlled at home, today their BP is BP: 140/74  BP Readings from Last 3 Encounters:  02/21/21 140/74  11/14/20 134/80  10/25/20 136/78     He does workout. He denies chest pain, shortness of breath, dizziness.   He is on cholesterol medication and denies myalgias. His cholesterol is at goal. The cholesterol last visit was:   Lab Results  Component Value Date   CHOL 146 10/25/2020   HDL 45 10/25/2020   LDLCALC 74 10/25/2020   TRIG 168 (H) 10/25/2020   CHOLHDL 3.2 10/25/2020   He does have diet controlled prediabetes.  He reports that he is doing well with diet and exercise.   Lab Results  Component Value Date   HGBA1C 5.3 10/25/2020    Last GFR Lab Results  Component Value Date   GFRNONAA 66 05/25/2020    Patient is on Vitamin D supplement.   Lab Results  Component Value Date   VD25OH 51 10/25/2020       Medication Review: Current Outpatient Medications on File Prior  to Visit  Medication Sig Dispense Refill   atorvastatin (LIPITOR) 80 MG tablet Take  1 tablet  Daily  for Cholesterol / Patient knows to take by mouth 90 tablet 3   cholecalciferol (VITAMIN D) 1000 UNITS tablet Take 5,000 Units by mouth daily.      cyclobenzaprine (FLEXERIL) 10 MG tablet Take 1/2 - 1 Tablet 3 x /day if needed for Muscle Spasm                                /            TAKE 1/2 TO 1 (ONE-HALF TO ONE) TABLET BY MOUTH 90 tablet 0    ibuprofen (ADVIL) 800 MG tablet Take  1 tablet  3 x /day  with Meals  as Needed for Pain & Inflamation 270 tablet 1   Omega-3 Fatty Acids (FISH OIL) 1000 MG CAPS Take by mouth.     omeprazole (PRILOSEC) 20 MG capsule Take 20 mg by mouth daily.     phentermine (ADIPEX-P) 37.5 MG tablet Take  1/2 to 1 tablet  every Morning for Dieting & Weight Loss                       /          TAKE 1/2 TO 1 (ONE-HALF TO ONE) TABLET BY MOUTH 90 tablet 0   sildenafil (VIAGRA) 100 MG tablet Take  1/2 to 1 tablet  Daily  if Needed for XXXX 30 tablet 1   No current facility-administered medications on file prior to visit.    Allergies: No Known Allergies  Current Problems (verified) has Hyperlipidemia, mixed; Hypertension; Abnormal glucose; Vitamin D deficiency; Overweight (BMI 25.0-29.9); OSA on CPAP; GERD (gastroesophageal reflux disease); Medication management; Tear of left supraspinatus tendon; Impingement syndrome of left shoulder; Tendinopathy of left biceps; FHx: heart disease; Former smoker (32 pack year, quit 1991); and Morbid obesity ([place]) on their problem list.  Screening Tests Immunization History  Administered Date(s) Administered   Influenza Inj Mdck Quad With Preservative 02/18/2020   Influenza, High Dose Seasonal PF 10/21/2015, 12/24/2016, 01/10/2018, 11/20/2018, 12/22/2020   PPD Test 11/10/2013, 11/11/2014   Pneumococcal Conjugate-13 10/21/2015   Pneumococcal Polysaccharide-23 05/31/2016   Pneumococcal-Unspecified 04/15/2009   Td 11/17/2004   Tdap 11/11/2014   Zoster, Live 10/28/2012    Preventative care: Last colonoscopy: 04/2016 due 2023  Prior vaccinations: TD or Tdap: 2016  Influenza: 12/22/20  Pneumococcal: 2018 Prevnar13: 2017 Shingles/Zostavax: 2014 Covid 19: declines   Names of Other Physician/Practitioners you currently use: 1. [facility] here for primary care 2. Dr. Delman Cheadle, eye doctor, last visit 2022 3. Dr. Aggie Moats, dentist, last  visit 2022, q41m  Patient Care Team: [name], MD as PCP - General  Surgical: He  has a past surgical history that includes Spine surgery. Family His family history includes COPD in his mother; Colon cancer in his father; Diabetes in his brother; Heart disease in his mother; Heart failure in his father and mother; Hypertension in his mother. Social history  He reports that he quit smoking about 31 years ago. His smoking use included cigarettes. He has a 32.00 pack-year smoking history. He has never used smokeless tobacco. He reports current alcohol use of about 14.0 standard drinks per week. He reports that he does not use drugs.  MEDICARE WELLNESS OBJECTIVES: Physical activity: Current Exercise Habits: Home exercise routine,  Type of exercise: walking, Time (Minutes): 20, Frequency (Times/Week): 2, Weekly Exercise (Minutes/Week): 40, Exercise limited by: None identified Cardiac risk factors: Cardiac Risk Factors include: advanced age (>64men, >69 women);dyslipidemia;hypertension;male gender;sedentary lifestyle;obesity (BMI >30kg/m2);smoking/ tobacco exposure Depression/mood screen:   Depression screen [facility] 2/9 02/21/2021  Decreased Interest 0  Down, Depressed, Hopeless 0  PHQ - 2 Score 0    ADLs:  In your present state of health, do you have any difficulty performing the following activities: 02/21/2021 10/24/2020  Hearing? N N  Vision? N N  Difficulty concentrating or making decisions? N N  Walking or climbing stairs? N N  Dressing or bathing? N N  Doing errands, shopping? N N  Some recent data might be hidden    Cognitive Testing  Alert? Yes  Normal Appearance?Yes  Oriented to person? Yes  Place? Yes   Time? Yes  Recall of three objects?  Yes  Can perform simple calculations? Yes  Displays appropriate judgment?Yes  Can read the correct time from a watch face?Yes  EOL planning: Does Patient Have a Medical Advance Directive?: Yes Type of Advance Directive: Healthcare  Power of Attorney, Living will Does patient want to make changes to medical advance directive?: No - Patient declined Copy of [place] in Chart?: No - copy requested   Objective:   Today's Vitals   02/21/21 1527  BP: 140/74  Pulse: (!) 104  Temp: 97.7 F (36.5 C)  SpO2: 96%  Weight: 213 lb (96.6 kg)    Body mass index is 30.56 kg/m.  General appearance: alert, no distress, WD/WN, male HEENT: normocephalic, sclerae anicteric, TMs pearly, nares patent, no discharge or erythema, pharynx normal Oral cavity: MMM, no lesions Neck: supple, no lymphadenopathy, no thyromegaly, no masses Heart: RRR, normal S1, S2, no murmurs Lungs: CTA bilaterally, no wheezes, rhonchi, or rales Abdomen: +bs, soft, non tender, non distended, no masses, no hepatomegaly, no splenomegaly Musculoskeletal: nontender, no swelling, no obvious deformity Extremities: no edema, no cyanosis, no clubbing Pulses: 2+ symmetric, upper and lower extremities, normal cap refill Neurological: alert, oriented x 3, CN2-12 intact, strength normal upper extremities and lower extremities, sensation normal throughout, DTRs 2+ throughout, no cerebellar signs, gait normal Psychiatric: normal affect, behavior normal, pleasant   Medicare Attestation I have personally reviewed: The patient's medical and social history Their use of alcohol, tobacco or illicit drugs Their current medications and supplements The patient's functional ability including ADLs,fall risks, home safety risks, cognitive, and hearing and visual impairment Diet and physical activities Evidence for depression or mood disorders  The patient's weight, height, BMI, and visual acuity have been recorded in the chart.  I have made referrals, counseling, and provided education to the patient based on review of the above and I have provided the patient with a written personalized care plan for preventive services.     Magda Bernheim, NP   02/21/2021

## 2021-02-21 NOTE — Patient Instructions (Signed)

## 2021-02-22 DIAGNOSIS — M1711 Unilateral primary osteoarthritis, right knee: Secondary | ICD-10-CM | POA: Diagnosis not present

## 2021-02-22 DIAGNOSIS — M25561 Pain in right knee: Secondary | ICD-10-CM | POA: Diagnosis not present

## 2021-02-22 LAB — CBC WITH DIFFERENTIAL/PLATELET
Absolute Monocytes: 676 cells/uL (ref 200–950)
Basophils Absolute: 50 cells/uL (ref 0–200)
Basophils Relative: 0.8 %
Eosinophils Absolute: 310 cells/uL (ref 15–500)
Eosinophils Relative: 5 %
HCT: 43.5 % (ref 38.5–50.0)
Hemoglobin: 14.6 g/dL (ref 13.2–17.1)
Lymphs Abs: 1265 cells/uL (ref 850–3900)
MCH: 31.3 pg (ref 27.0–33.0)
MCHC: 33.6 g/dL (ref 32.0–36.0)
MCV: 93.1 fL (ref 80.0–100.0)
MPV: 10.3 fL (ref 7.5–12.5)
Monocytes Relative: 10.9 %
Neutro Abs: 3900 cells/uL (ref 1500–7800)
Neutrophils Relative %: 62.9 %
Platelets: 288 10*3/uL (ref 140–400)
RBC: 4.67 10*6/uL (ref 4.20–5.80)
RDW: 11.9 % (ref 11.0–15.0)
Total Lymphocyte: 20.4 %
WBC: 6.2 10*3/uL (ref 3.8–10.8)

## 2021-02-22 LAB — LIPID PANEL
Cholesterol: 155 mg/dL (ref <200)
HDL: 52 mg/dL (ref >=40)
LDL Cholesterol (Calc): 76 mg/dL (calc)
Non-HDL Cholesterol (Calc): 103 mg/dL (calc) (ref <130)
Total CHOL/HDL Ratio: 3 (calc) (ref <5.0)
Triglycerides: 176 mg/dL — ABNORMAL HIGH (ref <150)

## 2021-02-22 LAB — COMPLETE METABOLIC PANEL WITH GFR
AG Ratio: 2.6 (calc) — ABNORMAL HIGH (ref 1.0–2.5)
ALT: 39 U/L (ref 9–46)
AST: 28 U/L (ref 10–35)
Albumin: 4.9 g/dL (ref 3.6–5.1)
Alkaline phosphatase (APISO): 89 U/L (ref 35–144)
BUN: 24 mg/dL (ref 7–25)
CO2: 22 mmol/L (ref 20–32)
Calcium: 9.8 mg/dL (ref 8.6–10.3)
Chloride: 108 mmol/L (ref 98–110)
Creat: 0.86 mg/dL (ref 0.70–1.28)
Globulin: 1.9 g/dL (calc) (ref 1.9–3.7)
Glucose, Bld: 107 mg/dL — ABNORMAL HIGH (ref 65–99)
Potassium: 4.3 mmol/L (ref 3.5–5.3)
Sodium: 140 mmol/L (ref 135–146)
Total Bilirubin: 0.5 mg/dL (ref 0.2–1.2)
Total Protein: 6.8 g/dL (ref 6.1–8.1)
eGFR: 93 mL/min/{1.73_m2} (ref >=60)

## 2021-02-22 LAB — MAGNESIUM: Magnesium: 1.9 mg/dL (ref 1.5–2.5)

## 2021-02-22 LAB — TSH: TSH: 2.09 mIU/L (ref 0.40–4.50)

## 2021-03-01 DIAGNOSIS — M25562 Pain in left knee: Secondary | ICD-10-CM | POA: Diagnosis not present

## 2021-03-01 DIAGNOSIS — M1712 Unilateral primary osteoarthritis, left knee: Secondary | ICD-10-CM | POA: Diagnosis not present

## 2021-03-02 DIAGNOSIS — M1711 Unilateral primary osteoarthritis, right knee: Secondary | ICD-10-CM | POA: Diagnosis not present

## 2021-03-02 DIAGNOSIS — M25561 Pain in right knee: Secondary | ICD-10-CM | POA: Diagnosis not present

## 2021-03-07 ENCOUNTER — Other Ambulatory Visit: Payer: Self-pay | Admitting: Internal Medicine

## 2021-03-08 DIAGNOSIS — M25562 Pain in left knee: Secondary | ICD-10-CM | POA: Diagnosis not present

## 2021-03-08 DIAGNOSIS — M1712 Unilateral primary osteoarthritis, left knee: Secondary | ICD-10-CM | POA: Diagnosis not present

## 2021-03-09 DIAGNOSIS — M25561 Pain in right knee: Secondary | ICD-10-CM | POA: Diagnosis not present

## 2021-03-09 DIAGNOSIS — M1711 Unilateral primary osteoarthritis, right knee: Secondary | ICD-10-CM | POA: Diagnosis not present

## 2021-03-15 DIAGNOSIS — M1712 Unilateral primary osteoarthritis, left knee: Secondary | ICD-10-CM | POA: Diagnosis not present

## 2021-03-15 DIAGNOSIS — M25562 Pain in left knee: Secondary | ICD-10-CM | POA: Diagnosis not present

## 2021-03-21 DIAGNOSIS — G4733 Obstructive sleep apnea (adult) (pediatric): Secondary | ICD-10-CM | POA: Diagnosis not present

## 2021-04-10 ENCOUNTER — Other Ambulatory Visit: Payer: Self-pay | Admitting: Internal Medicine

## 2021-04-10 MED ORDER — SILDENAFIL CITRATE 100 MG PO TABS: ORAL_TABLET | ORAL | 1 refills | Status: DC

## 2021-04-21 DIAGNOSIS — G4733 Obstructive sleep apnea (adult) (pediatric): Secondary | ICD-10-CM | POA: Diagnosis not present

## 2021-05-04 ENCOUNTER — Encounter: Payer: Self-pay | Admitting: Gastroenterology

## 2021-05-06 ENCOUNTER — Other Ambulatory Visit: Payer: Self-pay | Admitting: Internal Medicine

## 2021-05-11 ENCOUNTER — Telehealth: Payer: Self-pay | Admitting: Adult Health

## 2021-05-11 MED ORDER — CYCLOBENZAPRINE HCL 10 MG PO TABS: ORAL_TABLET | ORAL | 0 refills | Status: DC

## 2021-05-11 NOTE — Telephone Encounter (Signed)
Please send Cyclobenzaprine to [facility] in [place], she is going to use GoodRx to pay for it.  ?

## 2021-05-11 NOTE — Addendum Note (Signed)
Addended by: Chancy Hurter on: 05/11/2021 03:08 PM ? ? Modules accepted: Orders ? ?

## 2021-05-23 ENCOUNTER — Encounter: Payer: Self-pay | Admitting: Gastroenterology

## 2021-05-28 ENCOUNTER — Other Ambulatory Visit: Payer: Self-pay | Admitting: Internal Medicine

## 2021-05-28 DIAGNOSIS — E669 Obesity, unspecified: Secondary | ICD-10-CM

## 2021-05-31 ENCOUNTER — Other Ambulatory Visit: Payer: Self-pay | Admitting: Internal Medicine

## 2021-05-31 DIAGNOSIS — E669 Obesity, unspecified: Secondary | ICD-10-CM

## 2021-05-31 MED ORDER — PHENTERMINE HCL 37.5 MG PO TABS: ORAL_TABLET | ORAL | 1 refills | Status: DC

## 2021-06-13 ENCOUNTER — Ambulatory Visit (AMBULATORY_SURGERY_CENTER): Payer: Medicare HMO | Admitting: *Deleted

## 2021-06-13 ENCOUNTER — Other Ambulatory Visit: Payer: Self-pay | Admitting: Nurse Practitioner

## 2021-06-13 VITALS — Ht 70.0 in | Wt 212.0 lb

## 2021-06-13 DIAGNOSIS — Z86010 Personal history of colonic polyps: Secondary | ICD-10-CM

## 2021-06-13 DIAGNOSIS — Z800 Family history of malignant neoplasm of digestive organs: Secondary | ICD-10-CM

## 2021-06-13 MED ORDER — NA SULFATE-K SULFATE-MG SULF 17.5-3.13-1.6 GM/177ML PO SOLN: 1.0000 | ORAL | 0 refills | Status: DC

## 2021-06-13 NOTE — Progress Notes (Signed)
Patient's pre-visit was done today over the phone with the patient. Name,DOB and address verified. Patient denies any allergies to Eggs and Soy. Patient denies any problems with anesthesia/sedation. Patient is not taking any blood thinners. Pt aware to stop Phentermine for 10 days prior to colon. No home Oxygen. Insurance confirmed with patient-patient will scan in new insurance card into MyChart. ? ?Prep instructions sent to pt's MyChart (if available) or mailed to pt-pt is aware. Patient understands to call us back with any questions or concerns. Patient is aware of our care-partner policy.  ? ?EMMI education assigned to the patient for the procedure, sent to [place].  ? ?

## 2021-06-23 ENCOUNTER — Encounter: Payer: Self-pay | Admitting: Gastroenterology

## 2021-07-04 ENCOUNTER — Telehealth: Payer: Self-pay | Admitting: Internal Medicine

## 2021-07-04 ENCOUNTER — Emergency Department (HOSPITAL_BASED_OUTPATIENT_CLINIC_OR_DEPARTMENT_OTHER): Payer: Medicare HMO | Admitting: Radiology

## 2021-07-04 ENCOUNTER — Other Ambulatory Visit (HOSPITAL_BASED_OUTPATIENT_CLINIC_OR_DEPARTMENT_OTHER): Payer: Self-pay

## 2021-07-04 ENCOUNTER — Telehealth (HOSPITAL_COMMUNITY): Payer: Self-pay

## 2021-07-04 ENCOUNTER — Encounter: Payer: Self-pay | Admitting: Gastroenterology

## 2021-07-04 ENCOUNTER — Ambulatory Visit: Payer: Medicare HMO | Admitting: Gastroenterology

## 2021-07-04 ENCOUNTER — Encounter (HOSPITAL_BASED_OUTPATIENT_CLINIC_OR_DEPARTMENT_OTHER): Payer: Self-pay | Admitting: Emergency Medicine

## 2021-07-04 ENCOUNTER — Other Ambulatory Visit: Payer: Self-pay

## 2021-07-04 ENCOUNTER — Emergency Department (HOSPITAL_BASED_OUTPATIENT_CLINIC_OR_DEPARTMENT_OTHER)
Admission: EM | Admit: 2021-07-04 | Discharge: 2021-07-04 | Disposition: A | Discharge status: Home / Self Care | Payer: Medicare HMO | Attending: Emergency Medicine | Admitting: Emergency Medicine

## 2021-07-04 VITALS — BP 159/83 | HR 115 | Temp 98.1°F | Ht 70.0 in | Wt 212.0 lb

## 2021-07-04 DIAGNOSIS — R5383 Other fatigue: Secondary | ICD-10-CM | POA: Insufficient documentation

## 2021-07-04 DIAGNOSIS — Z86010 Personal history of colonic polyps: Secondary | ICD-10-CM

## 2021-07-04 DIAGNOSIS — I4891 Unspecified atrial fibrillation: Secondary | ICD-10-CM | POA: Diagnosis not present

## 2021-07-04 DIAGNOSIS — Z800 Family history of malignant neoplasm of digestive organs: Secondary | ICD-10-CM

## 2021-07-04 DIAGNOSIS — R42 Dizziness and giddiness: Secondary | ICD-10-CM | POA: Insufficient documentation

## 2021-07-04 DIAGNOSIS — R002 Palpitations: Secondary | ICD-10-CM | POA: Diagnosis present

## 2021-07-04 LAB — TSH: TSH: 1.669 u[IU]/mL (ref 0.350–4.500)

## 2021-07-04 LAB — PROTIME-INR
INR: 1 (ref 0.8–1.2)
Prothrombin Time: 13.6 seconds (ref 11.4–15.2)

## 2021-07-04 LAB — CBC
HCT: 44.3 % (ref 39.0–52.0)
Hemoglobin: 14.7 g/dL (ref 13.0–17.0)
MCH: 30.8 pg (ref 26.0–34.0)
MCHC: 33.2 g/dL (ref 30.0–36.0)
MCV: 92.7 fL (ref 80.0–100.0)
Platelets: 220 10*3/uL (ref 150–400)
RBC: 4.78 MIL/uL (ref 4.22–5.81)
RDW: 12.4 % (ref 11.5–15.5)
WBC: 5.1 10*3/uL (ref 4.0–10.5)
nRBC: 0 % (ref 0.0–0.2)

## 2021-07-04 LAB — MAGNESIUM: Magnesium: 1.7 mg/dL (ref 1.7–2.4)

## 2021-07-04 LAB — BASIC METABOLIC PANEL
Anion gap: 12 (ref 5–15)
BUN: 15 mg/dL (ref 8–23)
CO2: 22 mmol/L (ref 22–32)
Calcium: 9.6 mg/dL (ref 8.9–10.3)
Chloride: 104 mmol/L (ref 98–111)
Creatinine, Ser: 0.8 mg/dL (ref 0.61–1.24)
GFR, Estimated: >60 mL/min (ref >60)
Glucose, Bld: 104 mg/dL — ABNORMAL HIGH (ref 70–99)
Potassium: 4.1 mmol/L (ref 3.5–5.1)
Sodium: 138 mmol/L (ref 135–145)

## 2021-07-04 MED ORDER — SODIUM CHLORIDE 0.9 % IV SOLN: 500.0000 mL | Freq: Once | INTRAVENOUS | Status: DC

## 2021-07-04 MED ORDER — METOPROLOL TARTRATE 25 MG PO TABS
25.0000 mg | ORAL_TABLET | Freq: Two times a day (BID) | ORAL | 0 refills | Status: DC
  Filled 2021-07-04: qty 60, 30d supply, fill #0

## 2021-07-04 MED ORDER — APIXABAN 5 MG PO TABS
5.0000 mg | ORAL_TABLET | Freq: Two times a day (BID) | ORAL | 0 refills | Status: DC
  Filled 2021-07-04: qty 60, 30d supply, fill #0

## 2021-07-04 MED ORDER — METOPROLOL TARTRATE 5 MG/5ML IV SOLN
5.0000 mg | Freq: Once | INTRAVENOUS | Status: AC
  Administered 2021-07-04: 5 mg via INTRAVENOUS
  Filled 2021-07-04: qty 5

## 2021-07-04 MED ORDER — APIXABAN 2.5 MG PO TABS
5.0000 mg | ORAL_TABLET | Freq: Two times a day (BID) | ORAL | Status: DC
  Administered 2021-07-04: 5 mg via ORAL
  Filled 2021-07-04: qty 2

## 2021-07-04 MED ORDER — SODIUM CHLORIDE 0.9 % IV BOLUS
500.0000 mL | Freq: Once | INTRAVENOUS | Status: AC
  Administered 2021-07-04: 500 mL via INTRAVENOUS

## 2021-07-04 MED ORDER — METOPROLOL TARTRATE 25 MG PO TABS
25.0000 mg | ORAL_TABLET | Freq: Once | ORAL | Status: AC
  Administered 2021-07-04: 25 mg via ORAL
  Filled 2021-07-04: qty 1

## 2021-07-04 NOTE — Telephone Encounter (Signed)
Reached out to patient regarding ED follow up appointment. He is scheduled to come to the [facility] on 5/9 @ 9:00am to see [facility]. Patient given directions and mailed him a copy with a map and his appointment details. Patient verbalized understanding. ?

## 2021-07-04 NOTE — Discharge Instructions (Addendum)
You were seen in the emergency room today with atrial fibrillation.  I have placed a referral for you to see the A-fib clinic and have started you on blood thinning medicines.  Please read through the handout regarding Eliquis.  You cannot take aspirin, ibuprofen, naproxen while taking this medicine as it can increase the chance of bleeding.  ? ?If you are involved in even minor trauma to the head or begin to notice black/blood in the bowel movements or urine you should be evaluated immediately. ? ?Please take your medication as prescribed and follow with your primary care doctor as well as your cardiology team.  They should be calling you for an appointment.  ? ?Information on my medicine - ELIQUIS? (apixaban) ? ?This medication education was reviewed with me or my healthcare representative as part of my discharge preparation.  ? ?Why was Eliquis? prescribed for you? ?Eliquis? was prescribed for you to reduce the risk of a blood clot forming that can cause a stroke if you have a medical condition called atrial fibrillation (a type of irregular heartbeat). ? ?What do You need to know about Eliquis? ? ?Take your Eliquis? TWICE DAILY - one tablet in the morning and one tablet in the evening with or without food. If you have difficulty swallowing the tablet whole please discuss with your pharmacist how to take the medication safely. ? ?Take Eliquis? exactly as prescribed by your doctor and DO NOT stop taking Eliquis? without talking to the doctor who prescribed the medication.  Stopping may increase your risk of developing a stroke.  Refill your prescription before you run out. ? ?After discharge, you should have regular check-up appointments with your healthcare provider that is prescribing your Eliquis?.  In the future your dose may need to be changed if your kidney function or weight changes by a significant amount or as you get older. ? ?What do you do if you miss a dose? ?If you miss a dose, take it as soon as you  remember on the same day and resume taking twice daily.  Do not take more than one dose of ELIQUIS at the same time to make up a missed dose. ? ?Important Safety Information ?A possible side effect of Eliquis? is bleeding. You should call your healthcare provider right away if you experience any of the following: ?Bleeding from an injury or your nose that does not stop. ?Unusual colored urine (red or dark brown) or unusual colored stools (red or black). ?Unusual bruising for unknown reasons. ?A serious fall or if you hit your head (even if there is no bleeding). ? ?Some medicines may interact with Eliquis? and might increase your risk of bleeding or clotting while on Eliquis?Marland Kitchen To help avoid this, consult your healthcare provider or pharmacist prior to using any new prescription or non-prescription medications, including herbals, vitamins, non-steroidal anti-inflammatory drugs (NSAIDs) and supplements. ? ?This website has more information on Eliquis? (apixaban): http://www.eliquis.com/eliquis/home ? ?

## 2021-07-04 NOTE — Progress Notes (Signed)
Pt brought to room placed on the monitors HR 100-120's. Confirmed A-fib with ECG. ?This is new onset, pt said he has felt more tired and occasional SOB. Pt will be cancelled and referred to cardiology.tb ?

## 2021-07-04 NOTE — Progress Notes (Signed)
Pt states no changes to health hx since previsit ?

## 2021-07-04 NOTE — Telephone Encounter (Signed)
Spoke with Levada Dy at [facility] and advised Dr Harrington Challenger is out of the office until Jul 11, 2021.  Levada Dy states pt was advised to be further evaluated in the ED for Afib.  Lorelle Formosa RN for the callback. ?

## 2021-07-04 NOTE — ED Triage Notes (Signed)
Pt sent from [facility]  for AFIB after EKG for a colonoscopy showed him to be in AFIb. Pt Denis pain and states that he feels fine. Pt is not on thinners  ?

## 2021-07-04 NOTE — Progress Notes (Signed)
HPI: ?This is a man with FH CRC (father died in his 12s). Colonoscopy May 28, 2009 three subCM adenomas. Colonoscopy May 28, 2016 no precancerous polyps. ? ? ?ROS: complete GI ROS as described in HPI, all other review negative. ? ?Constitutional:  No unintentional weight loss ? ? ?Past Medical History:  ?Diagnosis Date  ? GERD (gastroesophageal reflux disease)   ? Hyperlipidemia   ? Obesity   ? Prediabetes   ? Sleep apnea   ? CPAP  ? Vitamin D deficiency   ? ? ?Past Surgical History:  ?Procedure Laterality Date  ? COLONOSCOPY  05/02/2016  ? Dr.Merdith Boyd  ? SPINE SURGERY    ? [phone]  ? ? ?Current Outpatient Medications  ?Medication Sig Dispense Refill  ? acetaminophen (TYLENOL) 500 MG tablet Take 500 mg by mouth 2 (two) times daily.    ? Ascorbic Acid (VITAMIN C PO) Take by mouth.    ? aspirin EC 81 MG tablet Take 81 mg by mouth every other day. Swallow whole.    ? atorvastatin (LIPITOR) 80 MG tablet Take  1 tablet  Daily  for Cholesterol / Patient knows to take by mouth 90 tablet 3  ? cholecalciferol (VITAMIN D) 1000 UNITS tablet Take 5,000 Units by mouth daily.     ? cyclobenzaprine (FLEXERIL) 10 MG tablet Take 1/2 to 1 tablet 3 x day as needed for Muscle Spasm 90 tablet 0  ? ibuprofen (ADVIL) 800 MG tablet TAKE 1 TABLET BY MOUTH TWO TIMES DAILY WITH MEALS AS NEEDED FOR PAIN AND FOR INFLAMMATION 180 tablet 0  ? Omega-3 Fatty Acids (FISH OIL) 1000 MG CAPS Take by mouth.    ? omeprazole (PRILOSEC) 20 MG capsule Take 20 mg by mouth daily.    ? zinc gluconate 50 MG tablet Take 50 mg by mouth daily.    ? phentermine (ADIPEX-P) 37.5 MG tablet Take  1/2 to 1 tablet every Morning for Dieting & Weight Loss                                                  /         TAKE            BY     MOUTH 90 tablet 1  ? ?Current Facility-Administered Medications  ?Medication Dose Route Frequency Provider Last Rate Last Admin  ? 0.9 %  sodium chloride infusion  500 mL Intravenous Once Milus Banister, MD      ? ? ?Allergies as of 07/04/2021  ? (No  Known Allergies)  ? ? ?Family History  ?Problem Relation Age of Onset  ? Hypertension Mother   ? Heart disease Mother   ? COPD Mother   ? Heart failure Mother   ? Heart failure Father   ? Colon cancer Father   ?     died at 22;pt unsure age,never knew father  ? Heart attack Father   ? Diabetes Brother   ? Esophageal cancer Neg Hx   ? Rectal cancer Neg Hx   ? Stomach cancer Neg Hx   ? Colon polyps Neg Hx   ? ? ?Social History  ? ?Socioeconomic History  ? Marital status: Married  ?  Spouse name: Not on file  ? Number of children: Not on file  ? Years of education: Not on file  ? Highest education level: Not on  file  ?Occupational History  ? Not on file  ?Tobacco Use  ? Smoking status: Former  ?  Packs/day: 1.00  ?  Years: 32.00  ?  Pack years: 32.00  ?  Types: Cigarettes  ?  Quit date: 02/03/1990  ?  Years since quitting: 31.4  ? Smokeless tobacco: Never  ?Vaping Use  ? Vaping Use: Never used  ?Substance and Sexual Activity  ? Alcohol use: Yes  ?  Alcohol/week: 14.0 standard drinks  ?  Types: 14 Standard drinks or equivalent per week  ?  Comment: 2-3 drinks per night per pt  ? Drug use: No  ? Sexual activity: Not Currently  ?Other Topics Concern  ? Not on file  ?Social History Narrative  ? Not on file  ? ?Social Determinants of Health  ? ?Financial Resource Strain: Not on file  ?Food Insecurity: Not on file  ?Transportation Needs: Not on file  ?Physical Activity: Not on file  ?Stress: Not on file  ?Social Connections: Not on file  ?Intimate Partner Violence: Not on file  ? ? ? ?Physical Exam: ?BP (!) 159/83   Pulse (!) 115   Temp 98.1 ?F (36.7 ?C)   Ht '5\' 10"'$  (1.778 m)   Wt 212 lb (96.2 kg)   SpO2 97%   BMI 30.42 kg/m?  ?Constitutional: generally well-appearing ?Psychiatric: alert and oriented x3 ?Lungs: CTA bilaterally ?Heart: no MCR ? ?Assessment and plan: ?71 y.o. male with increased risk for crc (FH CRC, personal h/o polyps) ? ?IRR IRR heart.  12 lead checked and it shows new afib, no ischemic changes, VRR  110-120. SBP 170sHe has felt a bit tired lately and has had rare chest discomforts he thinks were MS related. ? ?Colonoscopy cancelled. We are contacting [facility] today about a new patient appt today or tomorrow for the patient. ? ?Owens Loffler, MD ?[facility] ?07/04/2021, 9:12 AM ? ? ? ?

## 2021-07-04 NOTE — Telephone Encounter (Signed)
Levada Dy called stating that Dr. Owens Loffler would like Dr. Harrington Challenger to return call today regarding pt. Please advise ?

## 2021-07-04 NOTE — ED Notes (Signed)
Patient verbalizes understanding of discharge instructions. Opportunity for questioning and answers were provided. Patient discharged from ED.  °

## 2021-07-04 NOTE — ED Provider Notes (Signed)
? ?Emergency Department Provider Note ? ? ?I have reviewed the triage vital signs and the nursing notes. ? ? ?HISTORY ? ?Chief Complaint ?Atrial Fibrillation ? ? ?HPI ?Jason Dennis is a 71 y.o. male with past medical history reviewed below presents emergency department for evaluation of new onset atrial fibrillation.  He was due to have a screening colonoscopy this morning but upon checking and was found to be in A-fib with heart rate in the low 100 range.  He has no prior history of this.  He describes some fatigue but is not experiencing other symptoms of A-fib such as palpitations, chest pain, lightheadedness.  He states he is feeling somewhat tired and fatigued especially after completing the prep for colonoscopy but otherwise is feeling okay.  No prior history of bleeding disorder, GI bleeding, or intracranial hemorrhage.  The procedure was canceled and patient referred to the ED for evaluation. ? ? ?Past Medical History:  ?Diagnosis Date  ? GERD (gastroesophageal reflux disease)   ? Hyperlipidemia   ? Obesity   ? Prediabetes   ? Sleep apnea   ? CPAP  ? Vitamin D deficiency   ? ? ?Review of Systems ? ?Constitutional: No fever/chills. Positive fatigue.  ?Eyes: No visual changes. ?ENT: No sore throat. ?Cardiovascular: Denies chest pain. ?Respiratory: Denies shortness of breath. ?Gastrointestinal: No abdominal pain.  No nausea, no vomiting.  No diarrhea.  No constipation. ?Genitourinary: Negative for dysuria. ?Musculoskeletal: Negative for back pain. ?Skin: Negative for rash. ?Neurological: Negative for headaches, focal weakness or numbness. ? ?____________________________________________ ? ? ?PHYSICAL EXAM: ? ?VITAL SIGNS: ?ED Triage Vitals  ?Enc Vitals Group  ?   BP 07/04/21 1038 (!) 170/112  ?   Pulse Rate 07/04/21 1038 (!) 115  ?   Resp 07/04/21 1038 17  ?   Temp 07/04/21 1038 98.1 ?F (36.7 ?C)  ?   Temp Source 07/04/21 1038 Oral  ?   SpO2 07/04/21 1038 99 %  ?   Weight 07/04/21 1037 207 lb (93.9 kg)  ?    Height 07/04/21 1037 '5\' 10"'$  (1.778 m)  ? ?Constitutional: Alert and oriented. Well appearing and in no acute distress. ?Eyes: Conjunctivae are normal.  ?Head: Atraumatic. ?Nose: No congestion/rhinnorhea. ?Mouth/Throat: Mucous membranes are moist. ?Neck: No stridor.   ?Cardiovascular: A fib. Good peripheral circulation. Grossly normal heart sounds.   ?Respiratory: Normal respiratory effort.  No retractions. Lungs CTAB. ?Gastrointestinal: Soft and nontender. No distention.  ?Musculoskeletal: No lower extremity tenderness nor edema. No gross deformities of extremities. ?Neurologic:  Normal speech and language. No gross focal neurologic deficits are appreciated.  ?Skin:  Skin is warm, dry and intact. No rash noted. ? ?____________________________________________ ?  ?LABS ?(all labs ordered are listed, but only abnormal results are displayed) ? ?Labs Reviewed  ?BASIC METABOLIC PANEL - Abnormal; Notable for the following components:  ?    Result Value  ? Glucose, Bld 104 (*)   ? All other components within normal limits  ?CBC  ?PROTIME-INR  ?MAGNESIUM  ?TSH  ? ?____________________________________________ ? ?EKG ? ? EKG Interpretation ? ?Date/Time:  Tuesday Jul 04 2021 10:36:26 EDT ?Ventricular Rate:  110 ?PR Interval:    ?QRS Duration: 82 ?QT Interval:  361 ?QTC Calculation: 489 ?R Axis:   39 ?Text Interpretation: Atrial fibrillation Borderline prolonged QT interval No prior for comparison Confirmed by Nanda Quinton [phone]) on 07/04/2021 11:06:21 AM ?  ? ?  ? ?____________________________________________ ? ? ?PROCEDURES ? ?Procedure(s) performed:  ? ?Procedures ? ?CRITICAL CARE ?Performed by: Vonna Kotyk  G Valdis Bevill ?Total critical care time: 35 minutes ?Critical care time was exclusive of separately billable procedures and treating other patients. ?Critical care was necessary to treat or prevent imminent or life-threatening deterioration. ?Critical care was time spent personally by me on the following activities: development of  treatment plan with patient and/or surrogate as well as nursing, discussions with consultants, evaluation of patient's response to treatment, examination of patient, obtaining history from patient or surrogate, ordering and performing treatments and interventions, ordering and review of laboratory studies, ordering and review of radiographic studies, pulse oximetry and re-evaluation of patient's condition. ? ?Nanda Quinton, MD ?Emergency Medicine ? ?____________________________________________ ? ? ?INITIAL IMPRESSION / ASSESSMENT AND PLAN / ED COURSE ? ?Pertinent labs & imaging results that were available during my care of the patient were reviewed by me and considered in my medical decision making (see chart for details). ?  ?This patient is Presenting for Evaluation of fatigue/new a fib, which does require a range of treatment options, and is a complaint that involves a high risk of morbidity and mortality. ? ?The Differential Diagnoses include symptomatic a fib, ACS, thyroid disorder, electrolyte derrangement. ? ?Critical Interventions-  ?  ?Medications  ?metoprolol tartrate (LOPRESSOR) injection 5 mg (5 mg Intravenous Given 07/04/21 1105)  ?sodium chloride 0.9 % bolus 500 mL (0 mLs Intravenous Stopped 07/04/21 1238)  ?metoprolol tartrate (LOPRESSOR) tablet 25 mg (25 mg Oral Given 07/04/21 1238)  ? ? ?Reassessment after intervention:  HR improved but remains in a fib.  ? ? ?I decided to review pertinent External Data, and in summary patient evaluated today with [facility] for colonoscopy. Attempted to reach Cardiology but ultimately referred to the ED. ?  ?Clinical Laboratory Tests Ordered, included no acute kidney injury or electrolyte disturbance.  Magnesium is normal.  TSH is normal. ? ?Radiologic Tests Ordered, included CXR. I independently interpreted the images and agree with radiology interpretation.  ? ?Cardiac Monitor Tracing which shows a fib (rate 95-110) ? ? ?Social Determinants of Health Risk patient is a former  smoker. No COPD. ? ?Medical Decision Making: Summary:  ?Patient presents to the emergency department for evaluation of asymptomatic A-fib.  Describes some mild fatigue but no clear sudden onset of symptoms.  Patient is not a candidate for ED cardioversion.  He is not anticoagulated but do not see an absolute contraindication to anticoagulation.  I discussed A-fib management with the patient.  Will obtain screening blood work and give IV metoprolol.  If able to rate control, would be able to anticoagulate and refer to the A-fib clinic for follow-up.  ? ? ?CHA2DS2-VASc Score =  2  ? This indicates a  2.2% annual risk of stroke. ?The patient's score is based upon: ?  Age, and HTN history  ?   ? ?Reevaluation with update and discussion with patient and wife at bedside.  We discussed the risk/benefit of anticoagulation as well as the stroke risk related with A-fib.  Have made referral to the A-fib clinic and will discharge patient home with rate control medication as prescribed.  Discussed strict ED return precautions as well as cardiology and PCP follow-up plan.  Patient is comfortable with the plan at discharge. ? ?Disposition: discharge ? ?____________________________________________ ? ?FINAL CLINICAL IMPRESSION(S) / ED DIAGNOSES ? ?Final diagnoses:  ?Atrial fibrillation, unspecified type ([place])  ? ? ? ?NEW OUTPATIENT MEDICATIONS STARTED DURING THIS VISIT: ? ?Discharge Medication List as of 07/04/2021 12:39 PM  ?  ? ?START taking these medications  ? Details  ?apixaban (ELIQUIS)  5 MG TABS tablet Take 1 tablet (5 mg total) by mouth 2 (two) times daily., Starting Tue 07/04/2021, Until Thu 08/03/2021, Normal  ?  ?metoprolol tartrate (LOPRESSOR) 25 MG tablet Take 1 tablet (25 mg total) by mouth 2 (two) times daily., Starting Tue 07/04/2021, Until Thu 08/03/2021, Normal  ?  ?  ? ? ?Note:  This document was prepared using Dragon voice recognition software and may include unintentional dictation errors. ? ?Nanda Quinton, MD,  FACEP ?Emergency Medicine ? ?  ?Margette Fast, MD ?07/07/21 [phone] ? ?

## 2021-07-04 NOTE — Progress Notes (Signed)
Per Dr. Ardis Hughs request, [facility] was called to see if the patient could be seen within the next 24 hours for evaluation and clearance.  Unfortunately there are no openings to see the patient for evaluation.  Based on this information it was advised for the patient to go to the emergency room to be evaluated and have testing done to rule out any recent cardiac events due to the new onset A Fib and recent fatigue and SOB.  This was relayed to the patient and his wife who is present.  The patient declines to go to ER for evaluation.   The procedure has been cancelled for today and patient was encouraged to reach out after being evaluated and receiving cardiac clearance to have procedure rescheduled in the future.  Patient reports he will go to his PCP to possibly get a referral to be seen by another cardiologist.  Patient was A&Ox4 with no complaints of current symptoms at the time of release.  Recovery nurse strongly encouraged patient to report to ER if any chest pain, shortness of breath, or new onset of symptoms. ?

## 2021-07-07 DIAGNOSIS — G4733 Obstructive sleep apnea (adult) (pediatric): Secondary | ICD-10-CM | POA: Diagnosis not present

## 2021-07-11 ENCOUNTER — Ambulatory Visit: Payer: Medicare HMO | Admitting: Internal Medicine

## 2021-07-11 ENCOUNTER — Ambulatory Visit (HOSPITAL_COMMUNITY)
Admission: RE | Admit: 2021-07-11 | Discharge: 2021-07-11 | Disposition: A | Discharge status: Home / Self Care | Payer: Medicare HMO | Source: Ambulatory Visit | Attending: Physician Assistant | Admitting: Physician Assistant

## 2021-07-11 ENCOUNTER — Encounter (HOSPITAL_COMMUNITY): Payer: Self-pay | Admitting: Physician Assistant

## 2021-07-11 ENCOUNTER — Encounter: Payer: Self-pay | Admitting: Internal Medicine

## 2021-07-11 VITALS — BP 132/92 | HR 87 | Ht 70.0 in | Wt 208.8 lb

## 2021-07-11 VITALS — BP 140/80 | HR 98 | Ht 70.0 in | Wt 208.0 lb

## 2021-07-11 DIAGNOSIS — Z7901 Long term (current) use of anticoagulants: Secondary | ICD-10-CM | POA: Insufficient documentation

## 2021-07-11 DIAGNOSIS — Z87891 Personal history of nicotine dependence: Secondary | ICD-10-CM | POA: Insufficient documentation

## 2021-07-11 DIAGNOSIS — Z79899 Other long term (current) drug therapy: Secondary | ICD-10-CM | POA: Diagnosis not present

## 2021-07-11 DIAGNOSIS — I4819 Other persistent atrial fibrillation: Secondary | ICD-10-CM | POA: Diagnosis not present

## 2021-07-11 DIAGNOSIS — K219 Gastro-esophageal reflux disease without esophagitis: Secondary | ICD-10-CM | POA: Diagnosis not present

## 2021-07-11 DIAGNOSIS — G4733 Obstructive sleep apnea (adult) (pediatric): Secondary | ICD-10-CM | POA: Diagnosis not present

## 2021-07-11 DIAGNOSIS — D6869 Other thrombophilia: Secondary | ICD-10-CM | POA: Insufficient documentation

## 2021-07-11 DIAGNOSIS — Z9989 Dependence on other enabling machines and devices: Secondary | ICD-10-CM | POA: Insufficient documentation

## 2021-07-11 DIAGNOSIS — I10 Essential (primary) hypertension: Secondary | ICD-10-CM | POA: Insufficient documentation

## 2021-07-11 DIAGNOSIS — E785 Hyperlipidemia, unspecified: Secondary | ICD-10-CM | POA: Diagnosis not present

## 2021-07-11 DIAGNOSIS — Z01812 Encounter for preprocedural laboratory examination: Secondary | ICD-10-CM | POA: Diagnosis not present

## 2021-07-11 MED ORDER — APIXABAN 5 MG PO TABS
5.0000 mg | ORAL_TABLET | Freq: Two times a day (BID) | ORAL | 2 refills | Status: DC
Start: 2021-07-11 — End: 2021-10-30

## 2021-07-11 MED ORDER — METOPROLOL TARTRATE 25 MG PO TABS: 25.0000 mg | ORAL_TABLET | Freq: Two times a day (BID) | ORAL | 2 refills | Status: DC

## 2021-07-11 NOTE — Patient Instructions (Addendum)
?  You are scheduled for a Cardioversion on Thursday, May 25th with Dr. Harrell Gave.  Please arrive at the [facility] (Main Entrance A) at [facility]: [address] [place], [place] [address] at 10 AM.  ? ?DIET: Nothing to eat or drink after midnight except a sip of water with medications (see medication instructions below) ? ?FYI: For your safety, and to allow [place] to monitor your vital signs accurately during the surgery/procedure we request that    ?if you have artificial nails, gel coating, SNS etc. Please have those removed prior to your surgery/procedure. Not having the nail coverings /polish removed may result in cancellation or delay of your surgery/procedure. ? ? ?Medication Instructions: ? ?Continue your anticoagulant: Eliquis ? ?You will need to continue your anticoagulant after your procedure until you are told by your  ?Provider that it is safe to stop ? ? ?Labs: Please return for labs 1 week prior to procedure (BMET, CBC) ? ?Our in office lab hours are Monday-Friday 8:00-4:00, closed for lunch 12:45-1:45 pm.  No appointment needed. ? ?[facility] locations: ?  ?[place] ?- [address] (Dr. Nelly Laurence office) ?- [place] ([facility]) ?- [address] ?- [place]  ?[place] ?- [address]  ?  ?[place]  ?- [place] [address]  ?  ?[place] ?- [place]  ?[place]  ?- [place] ?- [place] [address] ([facility]) ? ?You must have a responsible person to drive you home and stay in the waiting area during your procedure. Failure to do so could result in cancellation. ? ?Interior and spatial designer cards. ? ?*Special Note: Every effort is made to have your procedure done on time. Occasionally there are emergencies that occur at the hospital that may cause delays. Please be patient if a delay does occur.  ? ? ?Follow up in 3 months with Dr.  Harl Bowie  ? ?

## 2021-07-11 NOTE — Progress Notes (Signed)
?Cardiology Office Note:   ? ?Date:  07/11/2021  ? ?ID:  Jason Dennis, DOB 09/15/1950, MRN [id number] ? ?PCP:  [name], MD ?  ?[facility] Providers ?Cardiologist:  None    ? ?Referring MD: Margette Fast, MD  ? ?No chief complaint on file. ?New Onset atrial fibrillation ? ?History of Present Illness:   ? ?Jason Dennis is a 71 y.o. male with a hx of GERD, HTN, sleep apnea on CPAP, who presented to the ED 5/3 for new onset afib ? ?He was prepping for colonoscopy and then he was noted to have tachycardia and EKG showed afib. He was evaluated in the ED. Started on eliquis and BB. He is asymptomatic currently.  He was seen in afib clinic today. Recommended DCCV. Wanted to follow up here first. This is his first visit. He has no other cardiac disease history. ? ?EKG 5/3- Afib HR 110 bpm ?EKG 07/11/2021- Afib 87 bpm ? ?CMR 08/17/2016 ?Indication unexplained syncope ?EF normal ?No LGE ?Normal LA/RA size ?Mild MR ? ?Past Medical History:  ?Diagnosis Date  ? GERD (gastroesophageal reflux disease)   ? Hyperlipidemia   ? Obesity   ? Prediabetes   ? Sleep apnea   ? CPAP  ? Vitamin D deficiency   ? ? ?Past Surgical History:  ?Procedure Laterality Date  ? COLONOSCOPY  05/02/2016  ? Dr.Jacobs  ? SPINE SURGERY    ? [phone]  ? ? ?Current Medications: ?Current Meds  ?Medication Sig  ? acetaminophen (TYLENOL) 500 MG tablet Take 500 mg by mouth 2 (two) times daily.  ? apixaban (ELIQUIS) 5 MG TABS tablet Take 1 tablet (5 mg total) by mouth 2 (two) times daily.  ? Ascorbic Acid (VITAMIN C PO) Take by mouth.  ? aspirin EC 81 MG tablet Take 81 mg by mouth every other day. Swallow whole.  ? atorvastatin (LIPITOR) 80 MG tablet Take  1 tablet  Daily  for Cholesterol / Patient knows to take by mouth  ? cholecalciferol (VITAMIN D) 1000 UNITS tablet Take 5,000 Units by mouth daily.   ? cyclobenzaprine (FLEXERIL) 10 MG tablet Take 1/2 to 1 tablet 3 x day as needed for Muscle Spasm  ? ibuprofen (ADVIL) 800 MG tablet TAKE 1 TABLET BY  MOUTH TWO TIMES DAILY WITH MEALS AS NEEDED FOR PAIN AND FOR INFLAMMATION  ? metoprolol tartrate (LOPRESSOR) 25 MG tablet Take 1 tablet (25 mg total) by mouth 2 (two) times daily.  ? Omega-3 Fatty Acids (FISH OIL) 1000 MG CAPS Take by mouth.  ? omeprazole (PRILOSEC) 20 MG capsule Take 20 mg by mouth daily.  ? ?Current Facility-Administered Medications for the 07/11/21 encounter (Office Visit) with [name], MD  ?Medication  ? 0.9 %  sodium chloride infusion  ?  ? ?Allergies:   Patient has no known allergies.  ? ?Social History  ? ?Socioeconomic History  ? Marital status: Married  ?  Spouse name: Not on file  ? Number of children: Not on file  ? Years of education: Not on file  ? Highest education level: Not on file  ?Occupational History  ? Not on file  ?Tobacco Use  ? Smoking status: Former  ?  Packs/day: 1.00  ?  Years: 32.00  ?  Pack years: 32.00  ?  Types: Cigarettes  ?  Quit date: 02/03/1990  ?  Years since quitting: 31.4  ? Smokeless tobacco: Never  ? Tobacco comments:  ?  Former smoker 07/11/21  ?Vaping Use  ? Vaping  Use: Never used  ?Substance and Sexual Activity  ? Alcohol use: Yes  ?  Alcohol/week: 14.0 standard drinks  ?  Types: 14 Standard drinks or equivalent per week  ?  Comment: 2-3 drinks per night per pt 07/11/21  ? Drug use: No  ? Sexual activity: Not Currently  ?Other Topics Concern  ? Not on file  ?Social History Narrative  ? Not on file  ? ?Social Determinants of Health  ? ?Financial Resource Strain: Not on file  ?Food Insecurity: Not on file  ?Transportation Needs: Not on file  ?Physical Activity: Not on file  ?Stress: Not on file  ?Social Connections: Not on file  ?  ? ?Family History: ?The patient's family history includes COPD in his mother; Colon cancer in his father; Diabetes in his brother; Heart attack in his father; Heart disease in his mother; Heart failure in his father and mother; Hypertension in his mother. There is no history of Esophageal cancer, Rectal cancer, Stomach cancer,  or Colon polyps. ? ?ROS:   ?Please see the history of present illness.    ? All other systems reviewed and are negative. ? ?EKGs/Labs/Other Studies Reviewed:   ? ?The following studies were reviewed today: ? ? ?EKG:  EKG is  ordered today.  The ekg ordered today demonstrates  ?Afib HR 87 QTc 438 ms ? ?Recent Labs: ?02/21/2021: ALT 39 ?07/04/2021: BUN 15; Creatinine, Ser 0.80; Hemoglobin 14.7; Magnesium 1.7; Platelets 220; Potassium 4.1; Sodium 138; TSH 1.669  ?Recent Lipid Panel ?   ?Component Value Date/Time  ? CHOL 155 02/21/2021 1621  ? TRIG 176 (H) 02/21/2021 1621  ? HDL 52 02/21/2021 1621  ? CHOLHDL 3.0 02/21/2021 1621  ? VLDL 30 09/12/2016 1147  ? [place] 76 02/21/2021 1621  ? ? ? ?Risk Assessment/Calculations:   ? ?CHA2DS2-VASc Score = 2  ? This indicates a 2.2% annual risk of stroke. ?The patient's score is based upon: ?CHF History: 0 ?HTN History: 1 ?Diabetes History: 0 ?Stroke History: 0 ?Vascular Disease History: 0 ?Age Score: 1 ?Gender Score: 0 ?  ? ? ?    ? ?Physical Exam:   ? ?VS:  ?Vitals:  ? 07/11/21 1407  ?BP: 140/80  ?Pulse: 98  ?SpO2: 98%  ? ? ? ?Wt Readings from Last 3 Encounters:  ?07/11/21 208 lb (94.3 kg)  ?07/11/21 208 lb 12.8 oz (94.7 kg)  ?07/04/21 207 lb (93.9 kg)  ?  ? ?GEN:  Well nourished, well developed in no acute distress ?HEENT: Normal ?NECK: No JVD; No carotid bruits ?LYMPHATICS: No lymphadenopathy ?CARDIAC: irregularly irregular rhythm, no murmurs, rubs, gallops ?RESPIRATORY:  Clear to auscultation without rales, wheezing or rhonchi  ?ABDOMEN: Soft, non-tender, non-distended ?MUSCULOSKELETAL:  No edema; No deformity  ?SKIN: Warm and dry ?NEUROLOGIC:  Alert and oriented x 3 ?PSYCHIATRIC:  Normal affect  ? ?ASSESSMENT:   ? ?#Paroxysmal Afib:  CHADS2VASC=2 We discussed sleep apnea being associated and he is compliant with his cpap. No thyroid dx. Prior MR in 2018 showed normal EF and LA size. Would be a good PVI/AAD candidate if persistent afib develops ?- Plan for DCCV in a couple of  weeks. Will be 3 weeks of uninterrupted AC. ?- continue metop tartrate 25 mg BID ?- continue eliquis 5 mg BID ? ?#HLD: continue atorvastatin 80 mg daily ? ?PLAN:   ? ?In order of problems listed above: ? ?DCCV May 25h ?Follow up 3 months ? ?   ? ?   ? ? ?Medication Adjustments/Labs and Tests Ordered: ?  Current medicines are reviewed at length with the patient today.  Concerns regarding medicines are outlined above.  ?Orders Placed This Encounter  ?Procedures  ? Basic metabolic panel  ? CBC  ? ?No orders of the defined types were placed in this encounter. ? ? ?Patient Instructions  ? ?You are scheduled for a Cardioversion on Thursday, May 25th with Dr. Harrell Gave.  Please arrive at the [facility] (Main Entrance A) at [facility]: [address] [place], [place] [address] at 10 AM.  ? ?DIET: Nothing to eat or drink after midnight except a sip of water with medications (see medication instructions below) ? ?FYI: For your safety, and to allow [place] to monitor your vital signs accurately during the surgery/procedure we request that    ?if you have artificial nails, gel coating, SNS etc. Please have those removed prior to your surgery/procedure. Not having the nail coverings /polish removed may result in cancellation or delay of your surgery/procedure. ? ? ?Medication Instructions: ? ?Continue your anticoagulant: Eliquis ? ?You will need to continue your anticoagulant after your procedure until you are told by your  ?Provider that it is safe to stop ? ? ?Labs: Please return for labs 1 week prior to procedure (BMET, CBC) ? ?Our in office lab hours are Monday-Friday 8:00-4:00, closed for lunch 12:45-1:45 pm.  No appointment needed. ? ?[facility] locations: ?  ?[place] ?- [address] (Dr. Nelly Laurence office) ?- [place] ([facility]) ?- [address] ?- [place]  ?[place] ?- [address]  ?  ?[place]  ?- [place] [address]  ?  ?[place] ?- [place]  ?[place]  ?- [place] ?- [place] [address] ([facility]) ? ?You must have a responsible person to d

## 2021-07-11 NOTE — Progress Notes (Signed)
? ? ?Primary Care Physician: [name], MD ?Primary Cardiologist: Dr Phineas Inches (new) ?Primary Electrophysiologist: none ?Referring Physician: [facility] ? ? ?Jason Dennis is a 71 y.o. male with a history of HLD, HTN, OSA, atrial fibrillation who presents for consultation in the [facility].  The patient was initially diagnosed with atrial fibrillation 07/04/21 after presenting to endoscopy for a routine colonoscopy. Preop, he was found to be in afib with heart rates in low 100s. He did have symptoms of fatigue but no other associated symptoms. The procedure was cancelled and he was referred to the ED for evaluation. He was started on metoprolol for rate control and Eliquis for a CHADS2VASC score of 2. Patient reports that he feels well today and is fairly asymptomatic. He is compliant with his CPAP machine. He does drink alcohol on a regular basis (works at a liquor store).  ? ?Today, he denies symptoms of palpitations, chest pain, shortness of breath, orthopnea, PND, lower extremity edema, dizziness, presyncope, syncope, bleeding, or neurologic sequela. The patient is tolerating medications without difficulties and is otherwise without complaint today.  ? ? ?Atrial Fibrillation Risk Factors: ? ?he does have symptoms or diagnosis of sleep apnea. ?he is compliant with CPAP therapy. ?he does not have a history of rheumatic fever. ?he does have a history of alcohol use. ?The patient does not have a history of early familial atrial fibrillation or other arrhythmias. ? ?he has a BMI of Body mass index is 29.96 kg/m?Marland KitchenMarland Kitchen ?Filed Weights  ? 07/11/21 0912  ?Weight: 94.7 kg  ? ? ?Family History  ?Problem Relation Age of Onset  ? Hypertension Mother   ? Heart disease Mother   ? COPD Mother   ? Heart failure Mother   ? Heart failure Father   ? Colon cancer Father   ?     died at 22;pt unsure age,never knew father  ? Heart attack Father   ? Diabetes Brother   ? Esophageal cancer Neg Hx   ?  Rectal cancer Neg Hx   ? Stomach cancer Neg Hx   ? Colon polyps Neg Hx   ? ? ? ?Atrial Fibrillation Management history: ? ?Previous antiarrhythmic drugs: none ?Previous cardioversions: none ?Previous ablations: none ?CHADS2VASC score: 2 ?Anticoagulation history: Eliquis ? ? ?Past Medical History:  ?Diagnosis Date  ? GERD (gastroesophageal reflux disease)   ? Hyperlipidemia   ? Obesity   ? Prediabetes   ? Sleep apnea   ? CPAP  ? Vitamin D deficiency   ? ?Past Surgical History:  ?Procedure Laterality Date  ? COLONOSCOPY  05/02/2016  ? Dr.Jacobs  ? SPINE SURGERY    ? [phone]  ? ? ?Current Outpatient Medications  ?Medication Sig Dispense Refill  ? acetaminophen (TYLENOL) 500 MG tablet Take 500 mg by mouth 2 (two) times daily.    ? apixaban (ELIQUIS) 5 MG TABS tablet Take 1 tablet (5 mg total) by mouth 2 (two) times daily. 60 tablet 0  ? Ascorbic Acid (VITAMIN C PO) Take by mouth.    ? aspirin EC 81 MG tablet Take 81 mg by mouth every other day. Swallow whole.    ? atorvastatin (LIPITOR) 80 MG tablet Take  1 tablet  Daily  for Cholesterol / Patient knows to take by mouth 90 tablet 3  ? cholecalciferol (VITAMIN D) 1000 UNITS tablet Take 5,000 Units by mouth daily.     ? cyclobenzaprine (FLEXERIL) 10 MG tablet Take 1/2 to 1 tablet 3 x day  as needed for Muscle Spasm 90 tablet 0  ? ibuprofen (ADVIL) 800 MG tablet TAKE 1 TABLET BY MOUTH TWO TIMES DAILY WITH MEALS AS NEEDED FOR PAIN AND FOR INFLAMMATION 180 tablet 0  ? metoprolol tartrate (LOPRESSOR) 25 MG tablet Take 1 tablet (25 mg total) by mouth 2 (two) times daily. 60 tablet 0  ? Omega-3 Fatty Acids (FISH OIL) 1000 MG CAPS Take by mouth.    ? omeprazole (PRILOSEC) 20 MG capsule Take 20 mg by mouth daily.    ? ?Current Facility-Administered Medications  ?Medication Dose Route Frequency Provider Last Rate Last Admin  ? 0.9 %  sodium chloride infusion  500 mL Intravenous Once Milus Banister, MD      ? ? ?No Known Allergies ? ?Social History  ? ?Socioeconomic History  ?  Marital status: Married  ?  Spouse name: Not on file  ? Number of children: Not on file  ? Years of education: Not on file  ? Highest education level: Not on file  ?Occupational History  ? Not on file  ?Tobacco Use  ? Smoking status: Former  ?  Packs/day: 1.00  ?  Years: 32.00  ?  Pack years: 32.00  ?  Types: Cigarettes  ?  Quit date: 02/03/1990  ?  Years since quitting: 31.4  ? Smokeless tobacco: Never  ? Tobacco comments:  ?  Former smoker 07/11/21  ?Vaping Use  ? Vaping Use: Never used  ?Substance and Sexual Activity  ? Alcohol use: Yes  ?  Alcohol/week: 14.0 standard drinks  ?  Types: 14 Standard drinks or equivalent per week  ?  Comment: 2-3 drinks per night per pt 07/11/21  ? Drug use: No  ? Sexual activity: Not Currently  ?Other Topics Concern  ? Not on file  ?Social History Narrative  ? Not on file  ? ?Social Determinants of Health  ? ?Financial Resource Strain: Not on file  ?Food Insecurity: Not on file  ?Transportation Needs: Not on file  ?Physical Activity: Not on file  ?Stress: Not on file  ?Social Connections: Not on file  ?Intimate Partner Violence: Not on file  ? ? ? ?ROS- All systems are reviewed and negative except as per the HPI above. ? ?Physical Exam: ?Vitals:  ? 07/11/21 0912  ?BP: (!) 132/92  ?Pulse: 87  ?Weight: 94.7 kg  ?Height: '5\' 10"'$  (1.778 m)  ? ? ?GEN- The patient is a well appearing male, alert and oriented x 3 today.   ?Head- normocephalic, atraumatic ?Eyes-  Sclera clear, conjunctiva pink ?Ears- hearing intact ?Oropharynx- clear ?Neck- supple  ?Lungs- Clear to ausculation bilaterally, normal work of breathing ?Heart- irregular rate and rhythm, no murmurs, rubs or gallops  ?GI- soft, NT, ND, + BS ?Extremities- no clubbing, cyanosis, or edema ?MS- no significant deformity or atrophy ?Skin- no rash or lesion ?Psych- euthymic mood, full affect ?Neuro- strength and sensation are intact ? ?Wt Readings from Last 3 Encounters:  ?07/11/21 94.7 kg  ?07/04/21 93.9 kg  ?07/04/21 96.2 kg  ? ? ?EKG  today demonstrates  ?Afib ?Vent. rate 87 BPM ?PR interval * ms ?QRS duration 84 ms ?QT/QTcB 364/438 ms ? ? ?Epic records are reviewed at length today ? ?CHA2DS2-VASc Score = 2  ?The patient's score is based upon: ?CHF History: 0 ?HTN History: 1 ?Diabetes History: 0 ?Stroke History: 0 ?Vascular Disease History: 0 ?Age Score: 1 ?Gender Score: 0 ?    ? ? ?ASSESSMENT AND PLAN: ?1. Persistent Atrial Fibrillation (ICD10:  I48.19) ?The patient's CHA2DS2-VASc score is 2, indicating a 2.2% annual risk of stroke.   ?General education about afib provided and questions answered. We also discussed his stroke risk and the risks and benefits of anticoagulation. ?Recommend DCCV after 3 weeks of anticoagulation. Patient would like to discuss with Dr Harl Bowie before proceeding.  ?Would also recommend echocardiogram.  ?Continue Eliquis 5 mg BID ?Continue Lopressor 25 mg BID ? ?2. Secondary Hypercoagulable State (ICD10:  D68.69) ?The patient is at significant risk for stroke/thromboembolism based upon his CHA2DS2-VASc Score of 2.  Continue Apixaban (Eliquis).  ? ?3. HTN ?Stable, no changes today. ? ?4. Obstructive sleep apnea ?The importance of adequate treatment of sleep apnea was discussed today in order to improve our ability to maintain sinus rhythm long term. ?Patient reports compliance with CPAP therapy.  ? ? ?Follow up with Dr Harl Bowie as scheduled.  ? ? ?Ricky Belal Scallon PA-C ?Afib Clinic ?[facility] ?[address] ?[place], [place] [address] ?[phone] ?07/11/2021 ?9:20 AM ? ?

## 2021-07-11 NOTE — H&P (View-Only) (Signed)
Cardiology Office Note:    Date:  07/11/2021   ID:  Jason Dennis, DOB 1950/03/27, MRN [id number]  PCP:  [name], MD   [facility] Providers Cardiologist:  None     Referring MD: Margette Fast, MD   No chief complaint on file. New Onset atrial fibrillation  History of Present Illness:    Jason Dennis is a 71 y.o. male with a hx of GERD, HTN, sleep apnea on CPAP, who presented to the ED 5/3 for new onset afib  He was prepping for colonoscopy and then he was noted to have tachycardia and EKG showed afib. He was evaluated in the ED. Started on eliquis and BB. He is asymptomatic currently.  He was seen in afib clinic today. Recommended DCCV. Wanted to follow up here first. This is his first visit. He has no other cardiac disease history.  EKG 5/3- Afib HR 110 bpm EKG 07/11/2021- Afib 87 bpm  CMR 08/17/2016 Indication unexplained syncope EF normal No LGE Normal LA/RA size Mild MR  Past Medical History:  Diagnosis Date   GERD (gastroesophageal reflux disease)    Hyperlipidemia    Obesity    Prediabetes    Sleep apnea    CPAP   Vitamin D deficiency     Past Surgical History:  Procedure Laterality Date   COLONOSCOPY  05/02/2016   Dr.Jacobs   SPINE SURGERY     [phone]    Current Medications: Current Meds  Medication Sig   acetaminophen (TYLENOL) 500 MG tablet Take 500 mg by mouth 2 (two) times daily.   apixaban (ELIQUIS) 5 MG TABS tablet Take 1 tablet (5 mg total) by mouth 2 (two) times daily.   Ascorbic Acid (VITAMIN C PO) Take by mouth.   aspirin EC 81 MG tablet Take 81 mg by mouth every other day. Swallow whole.   atorvastatin (LIPITOR) 80 MG tablet Take  1 tablet  Daily  for Cholesterol / Patient knows to take by mouth   cholecalciferol (VITAMIN D) 1000 UNITS tablet Take 5,000 Units by mouth daily.    cyclobenzaprine (FLEXERIL) 10 MG tablet Take 1/2 to 1 tablet 3 x day as needed for Muscle Spasm   ibuprofen (ADVIL) 800 MG tablet TAKE 1 TABLET BY  MOUTH TWO TIMES DAILY WITH MEALS AS NEEDED FOR PAIN AND FOR INFLAMMATION   metoprolol tartrate (LOPRESSOR) 25 MG tablet Take 1 tablet (25 mg total) by mouth 2 (two) times daily.   Omega-3 Fatty Acids (FISH OIL) 1000 MG CAPS Take by mouth.   omeprazole (PRILOSEC) 20 MG capsule Take 20 mg by mouth daily.   Current Facility-Administered Medications for the 07/11/21 encounter (Office Visit) with [name], MD  Medication   0.9 %  sodium chloride infusion     Allergies:   Patient has no known allergies.   Social History   Socioeconomic History   Marital status: Married    Spouse name: Not on file   Number of children: Not on file   Years of education: Not on file   Highest education level: Not on file  Occupational History   Not on file  Tobacco Use   Smoking status: Former    Packs/day: 1.00    Years: 32.00    Pack years: 32.00    Types: Cigarettes    Quit date: 02/03/1990    Years since quitting: 31.4   Smokeless tobacco: Never   Tobacco comments:    Former smoker 07/11/21  Vaping Use   Vaping  Use: Never used  Substance and Sexual Activity   Alcohol use: Yes    Alcohol/week: 14.0 standard drinks    Types: 14 Standard drinks or equivalent per week    Comment: 2-3 drinks per night per pt 07/11/21   Drug use: No   Sexual activity: Not Currently  Other Topics Concern   Not on file  Social History Narrative   Not on file   Social Determinants of Health   Financial Resource Strain: Not on file  Food Insecurity: Not on file  Transportation Needs: Not on file  Physical Activity: Not on file  Stress: Not on file  Social Connections: Not on file     Family History: The patient's family history includes COPD in his mother; Colon cancer in his father; Diabetes in his brother; Heart attack in his father; Heart disease in his mother; Heart failure in his father and mother; Hypertension in his mother. There is no history of Esophageal cancer, Rectal cancer, Stomach cancer,  or Colon polyps.  ROS:   Please see the history of present illness.     All other systems reviewed and are negative.  EKGs/Labs/Other Studies Reviewed:    The following studies were reviewed today:   EKG:  EKG is  ordered today.  The ekg ordered today demonstrates  Afib HR 87 QTc 438 ms  Recent Labs: 02/21/2021: ALT 39 07/04/2021: BUN 15; Creatinine, Ser 0.80; Hemoglobin 14.7; Magnesium 1.7; Platelets 220; Potassium 4.1; Sodium 138; TSH 1.669  Recent Lipid Panel    Component Value Date/Time   CHOL 155 02/21/2021 1621   TRIG 176 (H) 02/21/2021 1621   HDL 52 02/21/2021 1621   CHOLHDL 3.0 02/21/2021 1621   VLDL 30 09/12/2016 1147   LDLCALC 76 02/21/2021 1621     Risk Assessment/Calculations:    CHA2DS2-VASc Score = 2   This indicates a 2.2% annual risk of stroke. The patient's score is based upon: CHF History: 0 HTN History: 1 Diabetes History: 0 Stroke History: 0 Vascular Disease History: 0 Age Score: 1 Gender Score: 0          Physical Exam:    VS:  Vitals:   07/11/21 1407  BP: 140/80  Pulse: 98  SpO2: 98%     Wt Readings from Last 3 Encounters:  07/11/21 208 lb (94.3 kg)  07/11/21 208 lb 12.8 oz (94.7 kg)  07/04/21 207 lb (93.9 kg)     GEN:  Well nourished, well developed in no acute distress HEENT: Normal NECK: No JVD; No carotid bruits LYMPHATICS: No lymphadenopathy CARDIAC: irregularly irregular rhythm, no murmurs, rubs, gallops RESPIRATORY:  Clear to auscultation without rales, wheezing or rhonchi  ABDOMEN: Soft, non-tender, non-distended MUSCULOSKELETAL:  No edema; No deformity  SKIN: Warm and dry NEUROLOGIC:  Alert and oriented x 3 PSYCHIATRIC:  Normal affect   ASSESSMENT:    #Paroxysmal Afib:  CHADS2VASC=2 We discussed sleep apnea being associated and he is compliant with his cpap. No thyroid dx. Prior MR in 2018 showed normal EF and LA size. Would be a good PVI/AAD candidate if persistent afib develops - Plan for DCCV in a couple of  weeks. Will be 3 weeks of uninterrupted AC. - continue metop tartrate 25 mg BID - continue eliquis 5 mg BID  #HLD: continue atorvastatin 80 mg daily  PLAN:    In order of problems listed above:  DCCV May 25h Follow up 3 months           Medication Adjustments/Labs and Tests Ordered:  Current medicines are reviewed at length with the patient today.  Concerns regarding medicines are outlined above.  Orders Placed This Encounter  Procedures   Basic metabolic panel   CBC   No orders of the defined types were placed in this encounter.   Patient Instructions   You are scheduled for a Cardioversion on Thursday, May 25th with Dr. Harrell Gave.  Please arrive at the [facility] (Main Entrance A) at [facility]: [place], [place] [address] at 10 AM.   DIET: Nothing to eat or drink after midnight except a sip of water with medications (see medication instructions below)  FYI: For your safety, and to allow [place] to monitor your vital signs accurately during the surgery/procedure we request that    if you have artificial nails, gel coating, SNS etc. Please have those removed prior to your surgery/procedure. Not having the nail coverings /polish removed may result in cancellation or delay of your surgery/procedure.   Medication Instructions:  Continue your anticoagulant: Eliquis  You will need to continue your anticoagulant after your procedure until you are told by your  Provider that it is safe to stop   Labs: Please return for labs 1 week prior to procedure (BMET, CBC)  Our in office lab hours are Monday-Friday 8:00-4:00, closed for lunch 12:45-1:45 pm.  No appointment needed.  [facility] locations:   [address] (Dr. Nelly Laurence office) - [address] [place] ([facility]) - [address] [place] [address] [place] [address] - [address] [place] - [address] ([facility])  You must have a responsible person to drive you home and stay in the waiting area during your procedure. Failure to do so could result in cancellation.  Bring your insurance cards.  *Special Note: Every effort is made to have your procedure done on time. Occasionally there are emergencies that occur at the hospital that may cause delays. Please be patient if a delay does occur.    Follow up in 3 months with Dr. Harl Bowie     Signed, [name], MD  07/11/2021 3:03 PM    [place]

## 2021-07-13 NOTE — Addendum Note (Signed)
Addended byPhineas Inches on: 07/13/2021 10:46 AM ? ? Modules accepted: Orders ? ?

## 2021-07-18 DIAGNOSIS — Z01812 Encounter for preprocedural laboratory examination: Secondary | ICD-10-CM | POA: Diagnosis not present

## 2021-07-18 DIAGNOSIS — H4720 Unspecified optic atrophy: Secondary | ICD-10-CM | POA: Diagnosis not present

## 2021-07-18 DIAGNOSIS — H25811 Combined forms of age-related cataract, right eye: Secondary | ICD-10-CM | POA: Diagnosis not present

## 2021-07-18 DIAGNOSIS — H52203 Unspecified astigmatism, bilateral: Secondary | ICD-10-CM | POA: Diagnosis not present

## 2021-07-18 DIAGNOSIS — I4819 Other persistent atrial fibrillation: Secondary | ICD-10-CM | POA: Diagnosis not present

## 2021-07-18 DIAGNOSIS — H43811 Vitreous degeneration, right eye: Secondary | ICD-10-CM | POA: Diagnosis not present

## 2021-07-19 LAB — CBC
Hematocrit: 41.6 % (ref 37.5–51.0)
Hemoglobin: 14.6 g/dL (ref 13.0–17.7)
MCH: 32.1 pg (ref 26.6–33.0)
MCHC: 35.1 g/dL (ref 31.5–35.7)
MCV: 91 fL (ref 79–97)
Platelets: 247 10*3/uL (ref 150–450)
RBC: 4.55 x10E6/uL (ref 4.14–5.80)
RDW: 12 % (ref 11.6–15.4)
WBC: 5.6 10*3/uL (ref 3.4–10.8)

## 2021-07-19 LAB — BASIC METABOLIC PANEL
BUN/Creatinine Ratio: 22 (ref 10–24)
BUN: 19 mg/dL (ref 8–27)
CO2: 23 mmol/L (ref 20–29)
Calcium: 9.7 mg/dL (ref 8.6–10.2)
Chloride: 103 mmol/L (ref 96–106)
Creatinine, Ser: 0.85 mg/dL (ref 0.76–1.27)
Glucose: 98 mg/dL (ref 70–99)
Potassium: 4.8 mmol/L (ref 3.5–5.2)
Sodium: 139 mmol/L (ref 134–144)
eGFR: 93 mL/min/{1.73_m2} (ref >59)

## 2021-07-20 ENCOUNTER — Encounter (HOSPITAL_COMMUNITY): Payer: Self-pay | Admitting: Cardiology

## 2021-07-20 NOTE — Progress Notes (Signed)
Attempted to obtain medical history via telephone, unable to reach at this time. HIPAA compliant voicemail message left requesting return call to pre surgical testing department. 

## 2021-07-27 ENCOUNTER — Other Ambulatory Visit: Payer: Self-pay

## 2021-07-27 ENCOUNTER — Ambulatory Visit (HOSPITAL_COMMUNITY): Payer: Medicare HMO | Admitting: Anesthesiology

## 2021-07-27 ENCOUNTER — Encounter (HOSPITAL_COMMUNITY): Payer: Self-pay | Admitting: Cardiology

## 2021-07-27 ENCOUNTER — Encounter (HOSPITAL_COMMUNITY)
Admission: RE | Disposition: A | Discharge status: Home / Self Care | Payer: Self-pay | Source: Home / Self Care | Attending: Cardiology

## 2021-07-27 ENCOUNTER — Ambulatory Visit (HOSPITAL_COMMUNITY)
Admission: RE | Admit: 2021-07-27 | Discharge: 2021-07-27 | Disposition: A | Discharge status: Home / Self Care | Payer: Medicare HMO | Attending: Cardiology | Admitting: Cardiology

## 2021-07-27 ENCOUNTER — Ambulatory Visit (HOSPITAL_BASED_OUTPATIENT_CLINIC_OR_DEPARTMENT_OTHER): Payer: Medicare HMO | Admitting: Anesthesiology

## 2021-07-27 DIAGNOSIS — I4891 Unspecified atrial fibrillation: Secondary | ICD-10-CM

## 2021-07-27 DIAGNOSIS — I10 Essential (primary) hypertension: Secondary | ICD-10-CM

## 2021-07-27 DIAGNOSIS — E785 Hyperlipidemia, unspecified: Secondary | ICD-10-CM | POA: Insufficient documentation

## 2021-07-27 DIAGNOSIS — Z87891 Personal history of nicotine dependence: Secondary | ICD-10-CM | POA: Diagnosis not present

## 2021-07-27 DIAGNOSIS — Z7901 Long term (current) use of anticoagulants: Secondary | ICD-10-CM | POA: Diagnosis not present

## 2021-07-27 DIAGNOSIS — Z79899 Other long term (current) drug therapy: Secondary | ICD-10-CM | POA: Insufficient documentation

## 2021-07-27 DIAGNOSIS — G4730 Sleep apnea, unspecified: Secondary | ICD-10-CM | POA: Insufficient documentation

## 2021-07-27 DIAGNOSIS — Z9989 Dependence on other enabling machines and devices: Secondary | ICD-10-CM

## 2021-07-27 DIAGNOSIS — G4733 Obstructive sleep apnea (adult) (pediatric): Secondary | ICD-10-CM

## 2021-07-27 DIAGNOSIS — I480 Paroxysmal atrial fibrillation: Secondary | ICD-10-CM | POA: Diagnosis not present

## 2021-07-27 DIAGNOSIS — I4819 Other persistent atrial fibrillation: Secondary | ICD-10-CM | POA: Diagnosis not present

## 2021-07-27 DIAGNOSIS — K219 Gastro-esophageal reflux disease without esophagitis: Secondary | ICD-10-CM | POA: Insufficient documentation

## 2021-07-27 DIAGNOSIS — Z01812 Encounter for preprocedural laboratory examination: Secondary | ICD-10-CM

## 2021-07-27 HISTORY — PX: CARDIOVERSION: SHX1299

## 2021-07-27 SURGERY — CARDIOVERSION
Anesthesia: General

## 2021-07-27 MED ORDER — PROPOFOL 10 MG/ML IV BOLUS
INTRAVENOUS | Status: DC | PRN
  Administered 2021-07-27: 70 mg via INTRAVENOUS

## 2021-07-27 MED ORDER — SODIUM CHLORIDE 0.9 % IV SOLN
INTRAVENOUS | Status: DC
  Administered 2021-07-27: 500 mL via INTRAVENOUS

## 2021-07-27 MED ORDER — LIDOCAINE 2% (20 MG/ML) 5 ML SYRINGE
INTRAMUSCULAR | Status: DC | PRN
  Administered 2021-07-27: 60 mg via INTRAVENOUS

## 2021-07-27 NOTE — Anesthesia Preprocedure Evaluation (Addendum)
Anesthesia Evaluation  Patient identified by MRN, date of birth, ID band Patient awake    Reviewed: Allergy & Precautions, NPO status , Patient's Chart, lab work & pertinent test results  Airway Mallampati: III  TM Distance: <3 FB Neck ROM: Full  Mouth opening: Limited Mouth Opening  Dental no notable dental hx.    Pulmonary sleep apnea and Continuous Positive Airway Pressure Ventilation , former smoker,    Pulmonary exam normal breath sounds clear to auscultation       Cardiovascular hypertension, Pt. on medications and Pt. on home beta blockers  Rhythm:Irregular Rate:Abnormal  Atrial fibrillation Abnormal ECG Confirmed by Eleonore Chiquito [phone]) on 07/13/2021 6:47:56 AM   Neuro/Psych negative neurological ROS  negative psych ROS   GI/Hepatic GERD  ,(+)     substance abuse  alcohol use,   Endo/Other  Obesity prediabetes  Renal/GU negative Renal ROS  negative genitourinary   Musculoskeletal  (+) Arthritis , Osteoarthritis,    Abdominal   Peds negative pediatric ROS (+)  Hematology negative hematology ROS (+)   Anesthesia Other Findings   Reproductive/Obstetrics negative OB ROS                            Anesthesia Physical Anesthesia Plan  ASA: 3  Anesthesia Plan: General   Post-op Pain Management: Minimal or no pain anticipated   Induction: Intravenous  PONV Risk Score and Plan: 2 and Treatment may vary due to age or medical condition  Airway Management Planned: Mask  Additional Equipment: None  Intra-op Plan:   Post-operative Plan:   Informed Consent: I have reviewed the patients History and Physical, chart, labs and discussed the procedure including the risks, benefits and alternatives for the proposed anesthesia with the patient or authorized representative who has indicated his/her understanding and acceptance.     Dental advisory given  Plan Discussed with:  CRNA, Anesthesiologist and Surgeon  Anesthesia Plan Comments:        Anesthesia Quick Evaluation

## 2021-07-27 NOTE — CV Procedure (Signed)
Procedure:   DCCV  Indication:  Symptomatic atrial fibrillation  Procedure Note:  The patient signed informed consent.  They have had had therapeutic anticoagulation with apixaban greater than 3 weeks.  Anesthesia was administered by Dr. Elgie Congo.  Patient received 60 mg IV lidocaine and 70 mg IV propofol.Adequate airway was maintained throughout and vital followed per protocol.  They were cardioverted x 1 with 150J of biphasic synchronized energy.  They converted to NSR.  There were no apparent complications.  The patient had normal neuro status and respiratory status post procedure with vitals stable as recorded elsewhere.    Follow up:  They will continue on current medical therapy and follow up with cardiology as scheduled.  Buford Dresser, MD PhD 07/27/2021 11:16 AM

## 2021-07-27 NOTE — Discharge Instructions (Signed)

## 2021-07-27 NOTE — Transfer of Care (Signed)
Immediate Anesthesia Transfer of Care Note  Patient: Jason Dennis  Procedure(s) Performed: CARDIOVERSION  Patient Location: Endoscopy Unit  Anesthesia Type:MAC  Level of Consciousness: awake and drowsy  Airway & Oxygen Therapy: Patient Spontanous Breathing  Post-op Assessment: Report given to RN and Post -op Vital signs reviewed and stable  Post vital signs: Reviewed and stable  Last Vitals:  Vitals Value Taken Time  BP 150/94 (113)   Temp    Pulse 78   Resp 16   SpO2 97     Last Pain:  Vitals:   07/27/21 1031  TempSrc: Temporal  PainSc:          Complications: No notable events documented.

## 2021-07-27 NOTE — Anesthesia Postprocedure Evaluation (Signed)
Anesthesia Post Note  Patient: Jason Dennis  Procedure(s) Performed: CARDIOVERSION     Patient location during evaluation: PACU Anesthesia Type: General Level of consciousness: awake Pain management: pain level controlled Vital Signs Assessment: post-procedure vital signs reviewed and stable Respiratory status: spontaneous breathing and respiratory function stable Cardiovascular status: stable Postop Assessment: no apparent nausea or vomiting Anesthetic complications: no   No notable events documented.  Last Vitals:  Vitals:   07/27/21 1120 07/27/21 1130  BP: (!) 142/80 125/79  Pulse: 81 81  Resp: 15 14  Temp: 36.8 C   SpO2: 100% 97%    Last Pain:  Vitals:   07/27/21 1130  TempSrc:   PainSc: 0-No pain                 Merlinda Frederick

## 2021-07-27 NOTE — Interval H&P Note (Signed)
History and Physical Interval Note:  07/27/2021 10:37 AM  Jason Dennis  has presented today for surgery, with the diagnosis of atrial fibrillation.  The various methods of treatment have been discussed with the patient and family. After consideration of risks, benefits and other options for treatment, the patient has consented to  Procedure(s): CARDIOVERSION (N/A) as a surgical intervention.  The patient's history has been reviewed, patient examined, no change in status, stable for surgery.  I have reviewed the patient's chart and labs.  Questions were answered to the patient's satisfaction.     Yanina Knupp Harrell Gave

## 2021-07-27 NOTE — Anesthesia Procedure Notes (Signed)
Procedure Name: General with mask airway Date/Time: 07/27/2021 11:10 AM Performed by: Dorann Lodge, CRNA Pre-anesthesia Checklist: Patient identified, Emergency Drugs available, Suction available and Patient being monitored Patient Re-evaluated:Patient Re-evaluated prior to induction Oxygen Delivery Method: Ambu bag Preoxygenation: Pre-oxygenation with 100% oxygen Induction Type: IV induction

## 2021-08-07 ENCOUNTER — Other Ambulatory Visit: Payer: Self-pay | Admitting: Internal Medicine

## 2021-08-07 DIAGNOSIS — G4733 Obstructive sleep apnea (adult) (pediatric): Secondary | ICD-10-CM | POA: Diagnosis not present

## 2021-08-21 NOTE — Progress Notes (Unsigned)
FOLLOW UP 3 MONTH  Assessment:   Essential hypertension - Lifestyle controlled  DASH diet, exercise and monitor at home. Call if greater than 130/80.  -     CBC with Differential/Platelet -     CMP/GFR  Persistent Atrial Fibrillation(HCC)/Secondary Hypercoagulable State(HCC) Continue on Metoprolol and Eliquis Continue to follow with cardiology  OSA on CPAP Weight loss advised, continue CPAP Continue CPAP/BiPAP, using nightly for at least 8 hours  Helping with daytime fatigue Weight loss still advised Discussed mask & tubing hygeine  Mixed hyperlipidemia Continue medications: Atorvastatin '80mg'$  Discussed dietary and exercise modifications Low fat diet -     Lipid panel  Abnormal glucose Discussed general issues about diabetes pathophysiology and management., Educational material distributed., Suggested low cholesterol diet., Encouraged aerobic exercise., Discussed foot care., Reminded to get yearly retinal exam. - A1c  Gastroesophageal reflux disease, esophagitis presence not specified Continue PPI/H2 blocker, diet discussed  Vitamin D deficiency Continue supplement  Morbid obesity ([place]) - BMI 30 + with OSA - long discussion about weight loss, diet, and exercise  Former smoker (32pk, quit 1991) Monitor via CT Discussed with patient   Medication management Continued    Further disposition pending results if labs check today. Discussed med's effects and SE's.   Over 20 minutes of face to face interview, exam, counseling, chart review, and critical decision making was performed.    Future Appointments  Date Time Provider [place]  08/22/2021  9:30 AM [name], NP [facility] None  10/12/2021  9:20 AM [name], MD [facility]  11/27/2021 11:00 AM [name], MD [facility] None  02/21/2022  4:00 PM [name], NP [facility] None      Subjective:  Jason Dennis is a 71 y.o. male who presents for Medicare Annual  Wellness Visit and 3 month follow up for HTN, hyperlipidemia, glucose, and vitamin D Def.  He reports chronic right knee pain, previously getting injections by ortho ([facility] - Dr. Mayer Camel) and planning to have replaced in the next year. Also hx L shoulder impingement, saw Dr. Erlinda Hong and had an injction 90 days ago. Takes 800 mg ibuprofen in the AM prior to work, takes tylenol later in the day, also uses topical CBD.  Report he knows he will need another injection and poassibility of replacement.  Keeps his granddaughter [name].  BMI is There is no height or weight on file to calculate BMI., he is not currently working on diet and exercise.  He has started back on the phentermine to help with weight loss.  He is down 3lbs from last OV.   He is on CPAP for OSA.   Trying to walk for exercise.  Wt Readings from Last 3 Encounters:  07/11/21 208 lb 12.8 oz (94.7 kg)  07/11/21 208 lb (94.3 kg)  07/04/21 207 lb (93.9 kg)    His blood pressure has been controlled at home, today their BP is     He does workout. He denies chest pain, shortness of breath, dizziness.   He is on cholesterol medication and denies myalgias. His cholesterol is at goal. The cholesterol last visit was:   Lab Results  Component Value Date   CHOL 155 02/21/2021   HDL 52 02/21/2021   LDLCALC 76 02/21/2021   TRIG 176 (H) 02/21/2021   CHOLHDL 3.0 02/21/2021   He does have diet controlled prediabetes.  He reports that he is doing well with diet and exercise.   Lab Results  Component Value Date  HGBA1C 5.3 10/25/2020    Last GFR Lab Results  Component Value Date   GFRNONAA >60 07/04/2021    Patient is on Vitamin D supplement 5,000IU daily. Lab Results  Component Value Date   VD25OH 51 10/25/2020       Medication Review: Current Outpatient Medications on File Prior to Visit  Medication Sig Dispense Refill   acetaminophen (TYLENOL) 500 MG tablet Take 500 mg by mouth in the morning and at bedtime.      apixaban (ELIQUIS) 5 MG TABS tablet Take 1 tablet (5 mg total) by mouth 2 (two) times daily. 60 tablet 2   aspirin EC 81 MG tablet Take 81 mg by mouth every other day. Swallow whole.     atorvastatin (LIPITOR) 80 MG tablet Take  1 tablet  Daily  for Cholesterol / Patient knows to take by mouth 90 tablet 3   Cholecalciferol (VITAMIN D) 125 MCG (5000 UT) CAPS Take 5,000 Units by mouth daily.      cyclobenzaprine (FLEXERIL) 10 MG tablet Take 1/2 to 1 tablet 3 x day as needed for Muscle Spasm (Patient taking differently: Take 5-10 mg by mouth 3 (three) times daily as needed for muscle spasms. Take 1/2 to 1 tablet 3 x day as needed for Muscle Spasm) 90 tablet 0   ibuprofen (ADVIL) 800 MG tablet TAKE 1 TABLET BY MOUTH TWO TIMES DAILY WITH MEALS AS NEEDED FOR PAIN AND FOR INFLAMMATION 180 tablet 0   Liniments (DEEP BLUE RELIEF) GEL Apply 1 application. topically daily as needed (knee pain).     Menthol, Topical Analgesic, (BLUE-EMU MAXIMUM STRENGTH) 2.5 % LIQD Apply 1 application. topically daily as needed (knee pain).     metoprolol tartrate (LOPRESSOR) 25 MG tablet Take 1 tablet (25 mg total) by mouth 2 (two) times daily. 60 tablet 2   Multiple Vitamins-Minerals (CENTRUM SILVER 50+MEN PO) Take 1 tablet by mouth daily.     Omega-3 Fatty Acids (FISH OIL) 1000 MG CAPS Take 1,000 mg by mouth daily.     omeprazole (PRILOSEC) 20 MG capsule Take 20 mg by mouth daily.     Current Facility-Administered Medications on File Prior to Visit  Medication Dose Route Frequency Provider Last Rate Last Admin   0.9 %  sodium chloride infusion  500 mL Intravenous Once Milus Banister, MD        Allergies: No Known Allergies  Current Problems (verified) has Hyperlipidemia, mixed; Hypertension; Abnormal glucose; Vitamin D deficiency; Overweight (BMI 25.0-29.9); OSA on CPAP; GERD (gastroesophageal reflux disease); Medication management; Tear of left supraspinatus tendon; Impingement syndrome of left shoulder; Tendinopathy  of left biceps; FHx: heart disease; Former smoker (32 pack year, quit 1991); Morbid obesity ([place]); Persistent atrial fibrillation ([place]); and Secondary hypercoagulable state ([place]) on their problem list.  Screening Tests Immunization History  Administered Date(s) Administered   Influenza Inj Mdck Quad With Preservative 02/18/2020   Influenza, High Dose Seasonal PF 10/21/2015, 12/24/2016, 01/10/2018, 11/20/2018, 12/22/2020   PPD Test 11/10/2013, 11/11/2014   Pneumococcal Conjugate-13 10/21/2015   Pneumococcal Polysaccharide-23 05/31/2016   Pneumococcal-Unspecified 04/15/2009   Td 11/17/2004   Tdap 11/11/2014   Zoster, Live 10/28/2012    Preventative care: Last colonoscopy: 04/2016 due 2023  Prior vaccinations: TD or Tdap: 2016  Influenza: 2021  Pneumococcal: 2018 Prevnar13: 2017 Shingles/Zostavax: 2014 Covid 19: declines   Names of Other Physician/Practitioners you currently use: 1.  [facility] here for primary care 2. Dr. Delman Cheadle, eye doctor, last visit 2021, may scheduled for  2022 3. Dr. Aggie Moats, dentist, last visit 2022, q64m Patient Care Team: MUnk Pinto MD as PCP - General  Surgical: He  has a past surgical history that includes Spine surgery; Colonoscopy (05/02/2016); and Cardioversion (N/A, 07/27/2021). Family His family history includes COPD in his mother; Colon cancer in his father; Diabetes in his brother; Heart attack in his father; Heart disease in his mother; Heart failure in his father and mother; Hypertension in his mother. Social history  He reports that he quit smoking about 31 years ago. His smoking use included cigarettes. He has a 32.00 pack-year smoking history. He has never used smokeless tobacco. He reports current alcohol use of about 14.0 standard drinks of alcohol per week. He reports that he does not use drugs.   Objective:   There were no vitals filed for this visit.  There is no height or weight on file  to calculate BMI.  General appearance: alert, no distress, WD/WN, male HEENT: normocephalic, sclerae anicteric, TMs pearly, nares patent, no discharge or erythema, pharynx normal Oral cavity: MMM, no lesions Neck: supple, no lymphadenopathy, no thyromegaly, no masses Heart: RRR, normal S1, S2, no murmurs Lungs: CTA bilaterally, no wheezes, rhonchi, or rales Abdomen: +bs, soft, non tender, non distended, no masses, no hepatomegaly, no splenomegaly Musculoskeletal: nontender, no swelling, no obvious deformity Extremities: no edema, no cyanosis, no clubbing Pulses: 2+ symmetric, upper and lower extremities, normal cap refill Neurological: alert, oriented x 3, CN2-12 intact, strength normal upper extremities and lower extremities, sensation normal throughout, DTRs 2+ throughout, no cerebellar signs, gait normal Psychiatric: normal affect, behavior normal, pleasant    [facility]  08/21/2021

## 2021-08-22 ENCOUNTER — Ambulatory Visit (INDEPENDENT_AMBULATORY_CARE_PROVIDER_SITE_OTHER): Payer: Medicare HMO | Admitting: Nurse Practitioner

## 2021-08-22 ENCOUNTER — Encounter: Payer: Self-pay | Admitting: Nurse Practitioner

## 2021-08-22 VITALS — BP 122/82 | HR 97 | Temp 98.0°F | Wt 211.0 lb

## 2021-08-22 DIAGNOSIS — Z87891 Personal history of nicotine dependence: Secondary | ICD-10-CM | POA: Diagnosis not present

## 2021-08-22 DIAGNOSIS — R7309 Other abnormal glucose: Secondary | ICD-10-CM

## 2021-08-22 DIAGNOSIS — G4733 Obstructive sleep apnea (adult) (pediatric): Secondary | ICD-10-CM | POA: Diagnosis not present

## 2021-08-22 DIAGNOSIS — I4819 Other persistent atrial fibrillation: Secondary | ICD-10-CM | POA: Diagnosis not present

## 2021-08-22 DIAGNOSIS — E782 Mixed hyperlipidemia: Secondary | ICD-10-CM | POA: Diagnosis not present

## 2021-08-22 DIAGNOSIS — K219 Gastro-esophageal reflux disease without esophagitis: Secondary | ICD-10-CM | POA: Diagnosis not present

## 2021-08-22 DIAGNOSIS — D6869 Other thrombophilia: Secondary | ICD-10-CM

## 2021-08-22 DIAGNOSIS — Z9989 Dependence on other enabling machines and devices: Secondary | ICD-10-CM | POA: Diagnosis not present

## 2021-08-22 DIAGNOSIS — E559 Vitamin D deficiency, unspecified: Secondary | ICD-10-CM

## 2021-08-22 DIAGNOSIS — I10 Essential (primary) hypertension: Secondary | ICD-10-CM

## 2021-08-22 DIAGNOSIS — Z79899 Other long term (current) drug therapy: Secondary | ICD-10-CM | POA: Diagnosis not present

## 2021-09-04 ENCOUNTER — Other Ambulatory Visit: Payer: Self-pay | Admitting: Nurse Practitioner

## 2021-10-06 DIAGNOSIS — G4733 Obstructive sleep apnea (adult) (pediatric): Secondary | ICD-10-CM | POA: Diagnosis not present

## 2021-10-12 ENCOUNTER — Ambulatory Visit: Payer: Medicare HMO | Admitting: Internal Medicine

## 2021-10-12 ENCOUNTER — Encounter: Payer: Self-pay | Admitting: Internal Medicine

## 2021-10-12 VITALS — BP 130/72 | HR 96 | Resp 99 | Ht 70.0 in | Wt 210.4 lb

## 2021-10-12 DIAGNOSIS — R079 Chest pain, unspecified: Secondary | ICD-10-CM | POA: Diagnosis not present

## 2021-10-12 NOTE — Progress Notes (Signed)
Cardiology Office Note:    Date:  10/12/2021   ID:  Jason Dennis, DOB [birth year], MRN [id number]  PCP:  Jason Pinto, Dennis   [place] Providers Cardiologist:  None     Referring Dennis: Jason Pinto, Dennis   No chief complaint on file. New Onset atrial fibrillation  History of Present Illness:    Jason Dennis is a 71 y.o. male with a hx of GERD, HTN, sleep apnea on CPAP, who presented to the ED 5/3 for new onset afib  He was prepping for colonoscopy and then he was noted to have tachycardia and EKG showed afib. He was evaluated in the ED. Started on eliquis and BB. He is asymptomatic currently.  He was seen in afib clinic today. Recommended DCCV. Wanted to follow up here first. This is his first visit. He has no other cardiac disease history.  Interim 49/10 Hx Jason Dennis comes in today with his wife. He is s/p DCCV. He converted to NSR. He denies further palpitations. He notes that he has noticed chest tightness and SOB with carrying heavy cases. He works at the [employer] and noticed this recently.   EKG 5/3- Afib HR 110 bpm EKG 07/11/2021- Afib 87 bpm  CMR 08/17/2016 Indication unexplained syncope EF normal No LGE Normal LA/RA size Mild MR  Past Medical History:  Diagnosis Date   GERD (gastroesophageal reflux disease)    Hyperlipidemia    Obesity    Prediabetes    Sleep apnea    CPAP   Vitamin D deficiency     Past Surgical History:  Procedure Laterality Date   CARDIOVERSION N/A 07/27/2021   Procedure: CARDIOVERSION;  Surgeon: Jason Dresser, Dennis;  Location: [facility];  Service: Cardiovascular;  Laterality: N/A;   COLONOSCOPY  05/02/2016   Dr.Jacobs   SPINE SURGERY     [phone]    Current Medications: No outpatient medications have been marked as taking for the 10/12/21 encounter (Appointment) with [name], Dennis.   Current Facility-Administered Medications for the 10/12/21 encounter (Appointment) with [name], Dennis  Medication   0.9  %  sodium chloride infusion     Allergies:   Patient has no known allergies.   Social History   Socioeconomic History   Marital status: Married    Spouse name: Not on file   Number of children: Not on file   Years of education: Not on file   Highest education level: Not on file  Occupational History   Not on file  Tobacco Use   Smoking status: Former    Packs/day: 1.00    Years: 32.00    Total pack years: 32.00    Types: Cigarettes    Quit date: 02/03/1990    Years since quitting: 31.7   Smokeless tobacco: Never   Tobacco comments:    Former smoker 07/11/21  Vaping Use   Vaping Use: Never used  Substance and Sexual Activity   Alcohol use: Yes    Alcohol/week: 14.0 standard drinks of alcohol    Types: 14 Standard drinks or equivalent per week    Comment: 2-3 drinks per night per pt 07/11/21   Drug use: No   Sexual activity: Not Currently  Other Topics Concern   Not on file  Social History Narrative   Not on file   Social Determinants of Health   Financial Resource Strain: Not on file  Food Insecurity: Not on file  Transportation Needs: Not on file  Physical Activity: Not on file  Stress: Not on file  Social Connections: Not on file     Family History: The patient's family history includes COPD in his mother; Colon cancer in his father; Diabetes in his brother; Heart attack in his father; Heart disease in his mother; Heart failure in his father and mother; Hypertension in his mother. There is no history of Esophageal cancer, Rectal cancer, Stomach cancer, or Colon polyps.  ROS:   Please see the history of present illness.     All other systems reviewed and are negative.  EKGs/Labs/Other Studies Reviewed:    The following studies were reviewed today:   EKG:  EKG is  ordered today.  The ekg ordered today demonstrates   07/11/2021-Afib HR 87 QTc 438 ms  Recent Labs: 02/21/2021: ALT 39 07/04/2021: Magnesium 1.7; TSH 1.669 07/18/2021: BUN 19; Creatinine, Ser  0.85; Hemoglobin 14.6; Platelets 247; Potassium 4.8; Sodium 139   Recent Lipid Panel    Component Value Date/Time   CHOL 155 02/21/2021 1621   TRIG 176 (H) 02/21/2021 1621   HDL 52 02/21/2021 1621   CHOLHDL 3.0 02/21/2021 1621   VLDL 30 09/12/2016 1147   LDLCALC 76 02/21/2021 1621     Risk Assessment/Calculations:    CHA2DS2-VASc Score = 2   This indicates a 2.2% annual risk of stroke. The patient's score is based upon: CHF History: 0 HTN History: 1 Diabetes History: 0 Stroke History: 0 Vascular Disease History: 0 Age Score: 1 Gender Score: 0          Physical Exam:    VS:  Vitals:   10/12/21 0907  BP: 130/72  Pulse: 96  Resp: (!) 99     Wt Readings from Last 3 Encounters:  08/22/21 211 lb (95.7 kg)  07/11/21 208 lb 12.8 oz (94.7 kg)  07/11/21 208 lb (94.3 kg)     GEN:  Well nourished, well developed in no acute distress HEENT: Normal NECK: No JVD; No carotid bruits LYMPHATICS: No lymphadenopathy CARDIAC: R,R,R, no murmurs, rubs, gallops RESPIRATORY:  Clear to auscultation without rales, wheezing or rhonchi  ABDOMEN: Soft, non-tender, non-distended MUSCULOSKELETAL:  No edema; No deformity  SKIN: Warm and dry NEUROLOGIC:  Alert and oriented x 3 PSYCHIATRIC:  Normal affect   ASSESSMENT:    #CP: Has CCS II symptoms considering this and his age , his CVD risk is intermediate. Will plan to do exercise myoview  #Paroxysmal Afib:  CHADS2VASC=2 We discussed sleep apnea being associated and he is compliant with his cpap. No thyroid dx. Prior MR in 2018 showed normal EF and LA size. Would be a good PVI/AAD candidate if persistent afib develops. He is s/p DCCV with 150Jx1 07/27/2021 and converted to NSR - continue metop tartrate 25 mg BID - continue eliquis 5 mg BID  #HLD: continue atorvastatin 80 mg daily  PLAN:    In order of problems listed above:   Exercise myoview Follow up 6 months           Medication Adjustments/Labs and Tests  Ordered: Current medicines are reviewed at length with the patient today.  Concerns regarding medicines are outlined above.  No orders of the defined types were placed in this encounter.  No orders of the defined types were placed in this encounter.   There are no Patient Instructions on file for this visit.   Signed, [name], Dennis  10/12/2021 8:56 AM    [facility]

## 2021-10-12 NOTE — Patient Instructions (Addendum)
Medication Instructions:  Your physician recommends that you continue on your current medications as directed. Please refer to the Current Medication list given to you today.  *If you need a refill on your cardiac medications before your next appointment, please call your pharmacy*   Testing/Procedures: Dr. Harl Bowie has ordered a Myocardial Perfusion Imaging Study.   The test will take approximately 3 to 4 hours to complete; you may bring reading material.  If someone comes with you to your appointment, they will need to remain in the main lobby due to limited space in the testing area.  You will need to hold the following medications prior to your stress test: beta-blockers (24 hours prior to test) -Metoprolol tartrate   How to prepare for your Myocardial Perfusion Test: Do not eat or drink 3 hours prior to your test, except you may have water. Do not consume products containing caffeine (regular or decaffeinated) 12 hours prior to your test. (ex: coffee, chocolate, sodas, tea). Do wear comfortable clothes (no dresses or overalls) and walking shoes, tennis shoes preferred (No heels or open toe shoes are allowed). Do NOT wear cologne, perfume, aftershave, or lotions (deodorant is allowed). If these instructions are not followed, your test will have to be rescheduled.   Follow-Up: At [facility], you and your health needs are our priority.  As part of our continuing mission to provide you with exceptional heart care, we have created designated Provider Care Teams.  These Care Teams include your primary Cardiologist (physician) and Advanced Practice Providers (APPs -  Physician Assistants and Nurse Practitioners) who all work together to provide you with the care you need, when you need it.  We recommend signing up for the patient portal called "MyChart".  Sign up information is provided on this After Visit Summary.  MyChart is used to connect with patients for Virtual Visits (Telemedicine).   Patients are able to view lab/test results, encounter notes, upcoming appointments, etc.  Non-urgent messages can be sent to your provider as well.   To learn more about what you can do with MyChart, go to [email or web address].    Your next appointment:   6 month(s)  The format for your next appointment:   In Person  Provider:   Phineas Inches, MD

## 2021-10-17 ENCOUNTER — Telehealth: Payer: Self-pay | Admitting: Internal Medicine

## 2021-10-17 ENCOUNTER — Other Ambulatory Visit: Payer: Self-pay

## 2021-10-17 DIAGNOSIS — R079 Chest pain, unspecified: Secondary | ICD-10-CM

## 2021-10-17 NOTE — Telephone Encounter (Signed)
Called patient back- he states he would be unable to do the exercise testing of the stress test and asked what other options were open.  I did see a comment it would be okay to switch to Lexi if he did not reach HR- however, I switched testing so he could still have this completed as he is unable to walk for long periods (he has a bad knee)   Will route to MD to make aware.  Thanks!

## 2021-10-17 NOTE — Telephone Encounter (Signed)
Pt has a stress test on 10/24/21 however he states he has a bad knee and does not think he will be able to withstand this test. He is looking for more options, please advise.

## 2021-10-20 ENCOUNTER — Telehealth (HOSPITAL_COMMUNITY): Payer: Self-pay | Admitting: *Deleted

## 2021-10-20 NOTE — Telephone Encounter (Signed)
Close encounter 

## 2021-10-24 ENCOUNTER — Ambulatory Visit (HOSPITAL_COMMUNITY)
Admission: RE | Admit: 2021-10-24 | Discharge: 2021-10-24 | Disposition: A | Discharge status: Home / Self Care | Payer: Medicare HMO | Source: Ambulatory Visit | Attending: Cardiovascular Disease | Admitting: Cardiovascular Disease

## 2021-10-24 DIAGNOSIS — R079 Chest pain, unspecified: Secondary | ICD-10-CM

## 2021-10-24 LAB — MYOCARDIAL PERFUSION IMAGING
Peak HR: 117 {beats}/min
Rest HR: 97 {beats}/min
Rest Nuclear Isotope Dose: 10.9 mCi
SDS: 6
SRS: 1
SSS: 7
ST Depression (mm): 0 mm
Stress Nuclear Isotope Dose: 31.6 mCi
TID: 1.04

## 2021-10-24 MED ORDER — REGADENOSON 0.4 MG/5ML IV SOLN
0.4000 mg | Freq: Once | INTRAVENOUS | Status: AC
  Administered 2021-10-24: 0.4 mg via INTRAVENOUS

## 2021-10-24 MED ORDER — TECHNETIUM TC 99M TETROFOSMIN IV KIT
10.9000 | PACK | Freq: Once | INTRAVENOUS | Status: AC | PRN
  Administered 2021-10-24: 10.9 via INTRAVENOUS

## 2021-10-24 MED ORDER — TECHNETIUM TC 99M TETROFOSMIN IV KIT
31.6000 | PACK | Freq: Once | INTRAVENOUS | Status: AC | PRN
  Administered 2021-10-24: 31.6 via INTRAVENOUS

## 2021-10-25 ENCOUNTER — Telehealth: Payer: Self-pay | Admitting: Internal Medicine

## 2021-10-25 ENCOUNTER — Encounter: Payer: Medicare HMO | Admitting: Internal Medicine

## 2021-10-25 MED ORDER — ISOSORBIDE MONONITRATE ER 30 MG PO TB24: 30.0000 mg | ORAL_TABLET | Freq: Every day | ORAL | 3 refills | Status: DC

## 2021-10-25 NOTE — Telephone Encounter (Signed)
[name], MD  10/25/2021 10:05 AM EDT     Jason Dennis, please let Jason Dennis know that his stress test showed a very small spot on the scan that could just be an artifact. If he is having worsening symptoms, he can let us know and will consider LHC. Otherwise, he can continue to take his medications. Please start imdur 30 mg daily as well, to help with symptoms. I tried calling, no answer.   Spoke with patient regarding the following results. Patient made aware and patient verbalized understanding.   Patient states that  he feels okay and would like to try the Imdur and see how he feels before discussing a procedure. Patient states that he only has Chest pain when he over exerts himself and reports no other symptoms. Advised patient of Chest pain precautions as well as education for imdur. Advised patient to call with any issues, questions, or concerns/worsening symptoms. Patient aware of all instructions and verbalized understanding. Will forward to MD to make aware.

## 2021-10-25 NOTE — Telephone Encounter (Signed)
Pt is returning call for results. Requesting call back.

## 2021-10-30 ENCOUNTER — Other Ambulatory Visit (HOSPITAL_COMMUNITY): Payer: Self-pay | Admitting: Physician Assistant

## 2021-10-30 DIAGNOSIS — I4819 Other persistent atrial fibrillation: Secondary | ICD-10-CM

## 2021-10-30 NOTE — Telephone Encounter (Signed)
Eliquis '5mg'$  refill request received. Patient is 71 years old, weight-95.3kg, Crea-0.85 on 07/18/2021, Diagnosis-Afib, and last seen by Dr. Harl Bowie on 10/12/2021. Dose is appropriate based on dosing criteria. Will send in refill to requested pharmacy.

## 2021-11-02 ENCOUNTER — Telehealth: Payer: Self-pay | Admitting: Internal Medicine

## 2021-11-02 NOTE — Telephone Encounter (Signed)
The two medications can be taken together. However if patient is having side effects to the isosorbide it may be beneficial to space the medications apart to see if he tolerates them better

## 2021-11-02 NOTE — Telephone Encounter (Signed)
Wife stated that since patient has been taking Imdur, he wakes with a headache that will resolve after eating and moving around in the mornings. I explained how the medication works and that it may take another week or 2 for the patient to adjust to the medication. Also, he takes imdur at night with his metoprolol tartrate. Should those meds be taken together or at least 2 hours apart? Please advise.

## 2021-11-02 NOTE — Telephone Encounter (Signed)
Pt c/o medication issue:  1. Name of Medication:   isosorbide mononitrate (IMDUR) 30 MG 24 hr tablet  2. How are you currently taking this medication (dosage and times per day)? As prescribed  3. Are you having a reaction (difficulty breathing--STAT)? Headache  4. What is your medication issue?   Wife called stating patient has been taking this medication for a couple of days now and he has had a headache every morning.  Wife stated patient would like to know if he should continue to take this medication.

## 2021-11-03 NOTE — Telephone Encounter (Signed)
Returned call to patient's wife (okay per DPR) made her aware of PharmD's recommendations and patients wife verbalized understanding.

## 2021-11-06 DIAGNOSIS — G4733 Obstructive sleep apnea (adult) (pediatric): Secondary | ICD-10-CM | POA: Diagnosis not present

## 2021-11-08 ENCOUNTER — Other Ambulatory Visit: Payer: Self-pay | Admitting: Nurse Practitioner

## 2021-11-11 ENCOUNTER — Other Ambulatory Visit: Payer: Self-pay | Admitting: Nurse Practitioner

## 2021-11-12 ENCOUNTER — Other Ambulatory Visit: Payer: Self-pay | Admitting: Internal Medicine

## 2021-11-12 DIAGNOSIS — E782 Mixed hyperlipidemia: Secondary | ICD-10-CM

## 2021-11-12 MED ORDER — ATORVASTATIN CALCIUM 80 MG PO TABS: ORAL_TABLET | ORAL | 0 refills | Status: DC

## 2021-11-16 DIAGNOSIS — Z0100 Encounter for examination of eyes and vision without abnormal findings: Secondary | ICD-10-CM | POA: Diagnosis not present

## 2021-11-26 ENCOUNTER — Encounter: Payer: Self-pay | Admitting: Internal Medicine

## 2021-11-26 NOTE — Patient Instructions (Signed)

## 2021-11-26 NOTE — Progress Notes (Signed)
Annual  Screening/Preventative Visit  & Comprehensive Evaluation & Examination  Future Appointments  Date Time Provider Department  11/27/2021                             cpe 11:00 AM [name], MD [facility]  02/21/2022                           wellness   4:00 PM [name], NP [facility]  04/12/2022  4:20 PM [name], MD [facility]  11/30/2022                             cpe 11:00 AM [name], MD [facility]              This very nice 71 y.o. MWM presents for a Screening /Preventative Visit & comprehensive evaluation and management of multiple medical co-morbidities.  Patient has been followed for HTN, HLD, Prediabetes and Vitamin D Deficiency.   Patient is on CPAP for OSA with improved restorative sleep.  Patient has GERD controlled with his diet & meds.      HTN predates circa 2016. Patient's BP has been controlled at home.  Today's BP is at goal - 128/74 .  In 2018 , patient had a normal cardiac Myoview.   In May, 2023  he was dx'd with new Afib & started on Eliquis & BB. On 07/27/21, he  had successful CV by Dr Harrell Gave & now follows with Dr Phineas Inches.  On Aug 22,2023 he had a low risk exercise Lexiscan. Patient denies any cardiac symptoms as chest pain, palpitations, shortness of breath, dizziness or ankle swelling.       Patient's hyperlipidemia is controlled with diet and Atorvastatin. Patient denies myalgias or other medication SE's. Last lipids were at goal except elevated Trig's:  Lab Results  Component Value Date   CHOL 146 05/25/2020   HDL 46 05/25/2020   LDLCALC 67 05/25/2020   TRIG 238 (H) 05/25/2020   CHOLHDL 3.2 05/25/2020         Patient has hx/o prediabetes (A1c 5.9% /2011) and he has not had hypoglycemic symptoms, visual blurring, diabetic polys or paresthesias. Last A1c was normal & at goal:   Lab Results  Component Value Date   HGBA1C 5.3 10/25/2020         Finally, patient has history of Vitamin D Deficiency  ("31" /  2008) and last vitamin D was at goal:   Lab Results  Component Value Date   VD25OH 51 10/25/2020       Current Outpatient Medications on File Prior to Visit  Medication Sig   atorvastatin 80 MG tablet Take  1 tablet  Daily   VITAMIN D 5,000 UnitsUNITS tablet Take  daily.    cyclobenzaprine 10 MG tablet TAKE 1/2 TO 1  TABLET  THREE TIMES DAILY    ibuprofen 800 MG tablet Take  1 tablet  3 x /day  with Meals  as Needed    Omega-3 FISH OIL 1000 MG C    omeprazole 20 MG capsule Take 20 mg by mouth daily.   phentermine 37.5 MG tablet TAKE 1/2 TO 1  TABLET IN THE MORNING    sildenafil  100 MG tablet Take  1/2 to 1 tablet  Daily  if Needed for XXXX    No Known Allergies  Past Medical History:  Diagnosis Date   GERD (gastroesophageal reflux disease)    Hyperlipidemia    Obesity    Prediabetes    Sleep apnea    CPAP   Vitamin D deficiency      Health Maintenance  Topic Date Due   COVID-19 Vaccine (1) Never done   Zoster Vaccines- Shingrix (1 of 2) Never done   INFLUENZA VACCINE  10/03/2020   COLONOSCOPY (Pts 45-84yr Insurance coverage will need to be confirmed)  05/02/2021   TETANUS/TDAP  11/10/2024   Hepatitis C Screening  Completed   PNA vac Low Risk Adult  Completed   HPV VACCINES  Aged Out     Immunization History  Administered Date(s) Administered   Influenza Inj Mdck Quad  02/18/2020   Influenza, High Dose   12/24/2016, 01/10/2018, 11/20/2018   PPD Test 11/10/2013, 11/11/2014   Pneumococcal  - 13 10/21/2015   Pneumococcal  - 23 05/31/2016   Pneumococcal  - 23 04/15/2009   Td 11/17/2004   Tdap 11/11/2014   Zoster, Live 10/28/2012    Last Colon - 05/02/2016 - Dr JArdis Hughs- Recc a 5 year f/u   recently deferred when discovered new Afib as above.    Past Surgical History:  Procedure Laterality Date   Lumbar  SURGERY  1992       Family History  Problem Relation Age of Onset   Hypertension Mother    Heart disease Mother    COPD Mother    Heart failure  Mother    Heart failure Father    Colon cancer Father        died at 626;ptunsure age,never knew father   Diabetes Brother     Social History   Socioeconomic History   Marital status: Married    Spouse name: Not on file   Number of children: None  Occupational History   Sales, works  PT  in [employer]  Tobacco Use   Smoking status: Former    Packs/day: 1.00    Years: 32.00    Pack years: 32.00    Types: Cigarettes    Quit date: 02/03/1990    Years since quitting: 30.7   Smokeless tobacco: Never  Substance and Sexual Activity   Alcohol use: Yes    Alcohol/week: 14.0 standard drinks    Types: 14 Standard drinks or equivalent per week    Comment: 7 beers a week plus 1 mixed drink daily.   Drug use: No   Sexual activity: Not Currently      ROS Constitutional: Denies fever, chills, weight loss/gain, headaches, insomnia,  night sweats or change in appetite. Does c/o fatigue. Eyes: Denies redness, blurred vision, diplopia, discharge, itchy or watery eyes.  ENT: Denies discharge, congestion, post nasal drip, epistaxis, sore throat, earache, hearing loss, dental pain, Tinnitus, Vertigo, Sinus pain or snoring.  Cardio: Denies chest pain, palpitations, irregular heartbeat, syncope, dyspnea, diaphoresis, orthopnea, PND, claudication or edema Respiratory: denies cough, dyspnea, DOE, pleurisy, hoarseness, laryngitis or wheezing.  Gastrointestinal: Denies dysphagia, heartburn, reflux, water brash, pain, cramps, nausea, vomiting, bloating, diarrhea, constipation, hematemesis, melena, hematochezia, jaundice or hemorrhoids Genitourinary: Denies dysuria, frequency, discharge, hematuria or flank pain. Has urgency, nocturia x 2-3 & occasional hesitancy. Musculoskeletal: Denies arthralgia, myalgia, stiffness, Jt. Swelling, pain, limp or strain/sprain. Denies Falls. Skin: Denies puritis, rash, hives, warts, acne, eczema or change in skin lesion Neuro: No weakness, tremor, incoordination, spasms,  paresthesia or pain Psychiatric: Denies confusion, memory loss or sensory loss. Denies Depression. Endocrine:  Denies change in weight, skin, hair change, nocturia, and paresthesia, diabetic polys, visual blurring or hyper / hypo glycemic episodes.  Heme/Lymph: No excessive bleeding, bruising or enlarged lymph nodes.   Physical Exam  BP 128/74   Pulse 64   Temp 97.9 F (36.6 C)   Resp 16   Ht '5\' 10"'$  (1.778 m)   Wt 211 lb 12.8 oz (96.1 kg)   SpO2 98%   BMI 30.39 kg/m   General Appearance: Well nourished and well groomed and in no apparent distress.  Eyes: PERRLA, EOMs, conjunctiva no swelling or erythema, normal fundi and vessels. Sinuses: No frontal/maxillary tenderness ENT/Mouth: EACs patent / TMs  nl. Nares clear without erythema, swelling, mucoid exudates. Oral hygiene is good. No erythema, swelling, or exudate. Tongue normal, non-obstructing. Tonsils not swollen or erythematous. Hearing normal.  Neck: Supple, thyroid not palpable. No bruits, nodes or JVD. Respiratory: Respiratory effort normal.  BS equal and clear bilateral without rales, rhonci, wheezing or stridor. Cardio: Heart sounds are normal with slightly irregular rate and rhythm and no murmurs, rubs or gallops. Peripheral pulses are normal and equal bilaterally & pedal pulses 3+/3+ bilaterally without edema. No aortic or femoral bruits. Chest: symmetric with normal excursions and percussion.  Abdomen: Soft, with Nl bowel sounds. Nontender, no guarding, rebound, hernias, masses, or organomegaly.  Lymphatics: Non tender without lymphadenopathy.  Musculoskeletal: Full ROM all peripheral extremities, joint stability, 5/5 strength, and normal gait. Skin: Warm and dry without rashes, lesions, cyanosis, clubbing or  ecchymosis.  Neuro: Cranial nerves intact, reflexes equal bilaterally. Normal muscle tone, no cerebellar symptoms. Sensation intact.  Pysch: Alert and oriented X 3 with normal affect, insight and judgment  appropriate.   Assessment and Plan  1. Annual Preventative/Screening Exam    2. Essential hypertension  - Microalbumin / creatinine urine ratio - CBC with Differential/Platelet - COMPLETE METABOLIC PANEL WITH GFR - Magnesium - TSH - EKG 12-Lead - [place], RETROPERITNL ABD,  LTD - Urinalysis, Routine w reflex microscopic  3. Hyperlipidemia, mixed  - Lipid panel - TSH - EKG 12-Lead - [place], RETROPERITNL ABD,  LTD  4. Abnormal glucose  - Hemoglobin A1c - Insulin, random - EKG 12-Lead - [place], RETROPERITNL ABD,  LTD  5. Vitamin D deficiency  - VITAMIN D 25 Hydroxy   6. Paroxysmal atrial fibrillation (HCC)  - TSH - EKG 12-Lead - > shows back in Afib  - Patient advised f/u with Cardiologist    7. Gastroesophageal reflux disease  - POC Hemoccult Bld/Stl   8. BPH with obstruction/lower urinary tract symptoms  - PSA  9. OSA on CPAP   10. Screening for colorectal cancer  - POC Hemoccult Bld/Stl   11. Prostate cancer screening  - PSA  12. Screening for heart disease  - EKG 12-Lead- > shows back in Afib  - Patient advised f/u with Cardiologist   13. FHx: heart disease  - EKG 12-Lead- > shows back in Afib  - Patient advised f/u with Cardiologist   - [place], RETROPERITNL ABD,  LTD  14. Former smoker  - EKG 12-Lead - [place], RETROPERITNL ABD,  LTD  15. Screening for AAA (aortic abdominal aneurysm)  - [place], RETROPERITNL ABD,  LTD  16. Medication management  - Microalbumin / creatinine urine ratio - CBC with Differential/Platelet - COMPLETE METABOLIC PANEL WITH GFR - Magnesium - Lipid panel - TSH - Hemoglobin A1c - Insulin, random - VITAMIN D 25 Hydroxy - Urinalysis, Routine w reflex microscopic  Patient was counseled in prudent diet, weight control to achieve/maintain BMI less than 25, BP monitoring, regular exercise and medications as discussed.  Discussed med effects and SE's. Routine screening labs and tests as requested with regular follow-up as  recommended. Over 40 minutes of exam, counseling, chart review and high complex critical decision making was performed   Kirtland Bouchard, MD

## 2021-11-27 ENCOUNTER — Encounter: Payer: Self-pay | Admitting: Internal Medicine

## 2021-11-27 ENCOUNTER — Ambulatory Visit (INDEPENDENT_AMBULATORY_CARE_PROVIDER_SITE_OTHER): Payer: Medicare HMO | Admitting: Internal Medicine

## 2021-11-27 ENCOUNTER — Telehealth: Payer: Self-pay | Admitting: Internal Medicine

## 2021-11-27 VITALS — BP 128/74 | HR 64 | Temp 97.9°F | Resp 16 | Ht 70.0 in | Wt 211.8 lb

## 2021-11-27 DIAGNOSIS — I700 Atherosclerosis of aorta: Secondary | ICD-10-CM

## 2021-11-27 DIAGNOSIS — Z8249 Family history of ischemic heart disease and other diseases of the circulatory system: Secondary | ICD-10-CM

## 2021-11-27 DIAGNOSIS — Z87891 Personal history of nicotine dependence: Secondary | ICD-10-CM

## 2021-11-27 DIAGNOSIS — R7309 Other abnormal glucose: Secondary | ICD-10-CM

## 2021-11-27 DIAGNOSIS — I480 Paroxysmal atrial fibrillation: Secondary | ICD-10-CM | POA: Diagnosis not present

## 2021-11-27 DIAGNOSIS — Z79899 Other long term (current) drug therapy: Secondary | ICD-10-CM

## 2021-11-27 DIAGNOSIS — E559 Vitamin D deficiency, unspecified: Secondary | ICD-10-CM

## 2021-11-27 DIAGNOSIS — Z0000 Encounter for general adult medical examination without abnormal findings: Secondary | ICD-10-CM

## 2021-11-27 DIAGNOSIS — I10 Essential (primary) hypertension: Secondary | ICD-10-CM

## 2021-11-27 DIAGNOSIS — E782 Mixed hyperlipidemia: Secondary | ICD-10-CM

## 2021-11-27 DIAGNOSIS — G4733 Obstructive sleep apnea (adult) (pediatric): Secondary | ICD-10-CM

## 2021-11-27 DIAGNOSIS — Z136 Encounter for screening for cardiovascular disorders: Secondary | ICD-10-CM

## 2021-11-27 DIAGNOSIS — N401 Enlarged prostate with lower urinary tract symptoms: Secondary | ICD-10-CM

## 2021-11-27 DIAGNOSIS — Z0001 Encounter for general adult medical examination with abnormal findings: Secondary | ICD-10-CM

## 2021-11-27 DIAGNOSIS — K219 Gastro-esophageal reflux disease without esophagitis: Secondary | ICD-10-CM

## 2021-11-27 DIAGNOSIS — N138 Other obstructive and reflux uropathy: Secondary | ICD-10-CM | POA: Diagnosis not present

## 2021-11-27 DIAGNOSIS — Z125 Encounter for screening for malignant neoplasm of prostate: Secondary | ICD-10-CM | POA: Diagnosis not present

## 2021-11-27 DIAGNOSIS — Z1211 Encounter for screening for malignant neoplasm of colon: Secondary | ICD-10-CM

## 2021-11-27 NOTE — Telephone Encounter (Signed)
Patient c/o Palpitations:  High priority if patient c/o lightheadedness, shortness of breath, or chest pain  How long have you had palpitations/irregular HR/ Afib? Are you having the symptoms now? Went to primary doctor today, they did an EKG- She just received a call that patient was in [place]- they hold him to see his cardiologist  Are you currently experiencing lightheadedness, SOB or CP?  no  Do you have a history of afib (atrial fibrillation) or irregular heart rhythm? yes  Have you checked your BP or HR? (document readings if available):   Are you experiencing any other symptoms? No- made an appointment wit Isaac Laud- please call  to evaluate

## 2021-11-27 NOTE — Telephone Encounter (Signed)
Called provided number. Spoke to patient's wife. She states pt is not having any symptoms. "He has actually went to his part time job." No further questions/concerns expressed at this time.

## 2021-11-28 LAB — CBC WITH DIFFERENTIAL/PLATELET
Absolute Monocytes: 478 cells/uL (ref 200–950)
Basophils Absolute: 31 cells/uL (ref 0–200)
Basophils Relative: 0.6 %
Eosinophils Absolute: 161 cells/uL (ref 15–500)
Eosinophils Relative: 3.1 %
HCT: 42.4 % (ref 38.5–50.0)
Hemoglobin: 14.5 g/dL (ref 13.2–17.1)
Lymphs Abs: 1082 cells/uL (ref 850–3900)
MCH: 31.9 pg (ref 27.0–33.0)
MCHC: 34.2 g/dL (ref 32.0–36.0)
MCV: 93.4 fL (ref 80.0–100.0)
MPV: 10 fL (ref 7.5–12.5)
Monocytes Relative: 9.2 %
Neutro Abs: 3448 cells/uL (ref 1500–7800)
Neutrophils Relative %: 66.3 %
Platelets: 230 10*3/uL (ref 140–400)
RBC: 4.54 10*6/uL (ref 4.20–5.80)
RDW: 12.3 % (ref 11.0–15.0)
Total Lymphocyte: 20.8 %
WBC: 5.2 10*3/uL (ref 3.8–10.8)

## 2021-11-28 LAB — COMPLETE METABOLIC PANEL WITH GFR
AG Ratio: 2.5 (calc) (ref 1.0–2.5)
ALT: 39 U/L (ref 9–46)
AST: 22 U/L (ref 10–35)
Albumin: 4.8 g/dL (ref 3.6–5.1)
Alkaline phosphatase (APISO): 87 U/L (ref 35–144)
BUN/Creatinine Ratio: 28 (calc) — ABNORMAL HIGH (ref 6–22)
BUN: 28 mg/dL — ABNORMAL HIGH (ref 7–25)
CO2: 28 mmol/L (ref 20–32)
Calcium: 9.9 mg/dL (ref 8.6–10.3)
Chloride: 105 mmol/L (ref 98–110)
Creat: 1.01 mg/dL (ref 0.70–1.28)
Globulin: 1.9 g/dL (calc) (ref 1.9–3.7)
Glucose, Bld: 93 mg/dL (ref 65–99)
Potassium: 4.4 mmol/L (ref 3.5–5.3)
Sodium: 141 mmol/L (ref 135–146)
Total Bilirubin: 0.6 mg/dL (ref 0.2–1.2)
Total Protein: 6.7 g/dL (ref 6.1–8.1)
eGFR: 80 mL/min/{1.73_m2} (ref >=60)

## 2021-11-28 LAB — HEMOGLOBIN A1C
Hgb A1c MFr Bld: 5.4 % of total Hgb (ref <5.7)
Mean Plasma Glucose: 108 mg/dL
eAG (mmol/L): 6 mmol/L

## 2021-11-28 LAB — URINALYSIS, ROUTINE W REFLEX MICROSCOPIC
Bilirubin Urine: NEGATIVE
Glucose, UA: NEGATIVE
Hgb urine dipstick: NEGATIVE
Ketones, ur: NEGATIVE
Leukocytes,Ua: NEGATIVE
Nitrite: NEGATIVE
Protein, ur: NEGATIVE
Specific Gravity, Urine: 1.024 (ref 1.001–1.035)
pH: 5.5 (ref 5.0–8.0)

## 2021-11-28 LAB — LIPID PANEL
Cholesterol: 168 mg/dL (ref <200)
HDL: 59 mg/dL (ref >=40)
LDL Cholesterol (Calc): 78 mg/dL (calc)
Non-HDL Cholesterol (Calc): 109 mg/dL (calc) (ref <130)
Total CHOL/HDL Ratio: 2.8 (calc) (ref <5.0)
Triglycerides: 214 mg/dL — ABNORMAL HIGH (ref <150)

## 2021-11-28 LAB — TSH: TSH: 2 mIU/L (ref 0.40–4.50)

## 2021-11-28 LAB — MAGNESIUM: Magnesium: 1.9 mg/dL (ref 1.5–2.5)

## 2021-11-28 LAB — MICROALBUMIN / CREATININE URINE RATIO
Creatinine, Urine: 111 mg/dL (ref 20–320)
Microalb Creat Ratio: 12 mcg/mg creat (ref <30)
Microalb, Ur: 1.3 mg/dL

## 2021-11-28 LAB — PSA: PSA: 0.83 ng/mL (ref <=4.00)

## 2021-11-28 LAB — VITAMIN D 25 HYDROXY (VIT D DEFICIENCY, FRACTURES): Vit D, 25-Hydroxy: 50 ng/mL (ref 30–100)

## 2021-11-28 LAB — INSULIN, RANDOM: Insulin: 13 u[IU]/mL

## 2021-11-28 NOTE — Progress Notes (Signed)
<><><><><><><><><><><><><><><><><><><><><><><><><><><><><><><><><> <><><><><><><><><><><><><><><><><><><><><><><><><><><><><><><><><> -   Test results slightly outside the reference range are not unusual. If there is anything important, I will review this with you,  otherwise it is considered normal test values.  If you have further questions,  please do not hesitate to contact me at the office or via My Chart.  <><><><><><><><><><><><><><><><><><><><><><><><><><><><><><><><><> <><><><><><><><><><><><><><><><><><><><><><><><><><><><><><><><><>  -  Total Chol =  168    &   LDL  Chol = 78  - Both  Excellent   - Very low risk for Heart Attack  / Stroke <><><><><><><><><><><><><><><><><><><><><><><><><><><><><><><><><>  -  But  Triglycerides (   214   ) or fats in blood are too high                 (   Ideal or  Goal is less than 150  !  )    - Recommend avoid fried & greasy foods,  sweets / candy,   - Avoid white rice  (brown or wild rice or Quinoa is OK),   - Avoid white potatoes  (sweet potatoes are OK)   - Avoid anything made from white flour  - bagels, doughnuts, rolls, buns, biscuits, white and   wheat breads, pizza crust and traditional  pasta made of white flour & egg white  - (vegetarian pasta or spinach or wheat pasta is OK).    - Multi-grain bread is OK - like multi-grain flat bread or  sandwich thins.   - Avoid alcohol in excess.   - Exercise is also important. <><><><><><><><><><><><><><><><><><><><><><><><><><><><><><><><><> <><><><><><><><><><><><><><><><><><><><><><><><><><><><><><><><><>  -  PSA - Very Low  - Great !  <><><><><><><><><><><><><><><><><><><><><><><><><><><><><><><><><>  -  A1c - Normal - No Diabetes  - Great !  <><><><><><><><><><><><><><><><><><><><><><><><><><><><><><><><><>  -  Vit D = 50  - is a little low   - Vitamin D goal is between 70-100.   - Please INCREASE your Vitamin D 5,000 units to 2 capsules = 10,000 units /day   - It is  very important as a natural anti-inflammatory and helping the  immune system protect against viral infections, like the Covid-19  helping hair, skin, and nails, as well as reducing stroke and heart attack risk.   - It helps your bones and helps with mood.  - It also decreases numerous cancer risks so please take it as directed.   - Low Vit D is associated with a 200-300% higher risk for CANCER   and 200-300% higher risk for HEART   ATTACK  &  STROKE.    - It is also associated with higher death rate at younger ages,   autoimmune diseases like Rheumatoid arthritis, Lupus,  Multiple Sclerosis.     - Also many other serious conditions, like depression, Alzheimer's  Dementia, infertility, muscle aches, fatigue, fibromyalgia   - just to name a few. <><><><><><><><><><><><><><><><><><><><><><><><><><><><><><><><><> <><><><><><><><><><><><><><><><><><><><><><><><><><><><><><><><><>  -  All Else - CBC - Kidneys - Electrolytes - Liver - Magnesium & Thyroid    - all  Normal / OK  <><><><><><><><><><><><><><><><><><><><><><><><><><><><><><><><><> <><><><><><><><><><><><><><><><><><><><><><><><><><><><><><><><><>  - Keep up the [place] Work   !  <><><><><><><><><><><><><><><><><><><><><><><><><><><><><><><><><> <><><><><><><><><><><><><><><><><><><><><><><><><><><><><><><><><>

## 2021-11-29 ENCOUNTER — Telehealth: Payer: Self-pay

## 2021-11-29 ENCOUNTER — Ambulatory Visit: Payer: Medicare HMO | Attending: Physician Assistant | Admitting: Physician Assistant

## 2021-11-29 ENCOUNTER — Encounter: Payer: Self-pay | Admitting: Physician Assistant

## 2021-11-29 VITALS — BP 136/76 | HR 99 | Ht 70.0 in | Wt 215.4 lb

## 2021-11-29 DIAGNOSIS — I4819 Other persistent atrial fibrillation: Secondary | ICD-10-CM | POA: Diagnosis not present

## 2021-11-29 DIAGNOSIS — Z79899 Other long term (current) drug therapy: Secondary | ICD-10-CM | POA: Diagnosis not present

## 2021-11-29 DIAGNOSIS — T462X4D Poisoning by other antidysrhythmic drugs, undetermined, subsequent encounter: Secondary | ICD-10-CM

## 2021-11-29 MED ORDER — AMIODARONE HCL 100 MG PO TABS: 100.0000 mg | ORAL_TABLET | Freq: Every day | ORAL | 1 refills | Status: DC

## 2021-11-29 MED ORDER — AMIODARONE HCL 100 MG PO TABS: 100.0000 mg | ORAL_TABLET | Freq: Every day | ORAL | 2 refills | Status: DC

## 2021-11-29 NOTE — Telephone Encounter (Signed)
-----   Message from [name], MD sent at 11/27/2021  3:00 PM EDT ----- Regarding: RE: Back in A fib Thanks. Betsey Sossamon please call Mr Melford Aase and let him know that if he is having symptoms of palpitations or rapid heart rates, we can schedule a FU visit.  Otherwise he can continue his BB and eliquis. ----- Message ----- From: [name], MD Sent: 11/27/2021   2:27 PM EDT To: [name], MD Subject: Back in A fib                                    Dr Patrina Levering was in today for his annual CPE &                          unfortunately his EKG shows that he's back in Afib  - He was advised to contact your office for f/u  - Thanks,  - bill mck

## 2021-11-29 NOTE — Telephone Encounter (Signed)
Attempted to call patient, left message for patient to call back to office.   

## 2021-11-29 NOTE — Telephone Encounter (Signed)
Patient being seen today in office with Almyra Deforest, PA-C

## 2021-11-29 NOTE — Patient Instructions (Addendum)
Medication Instructions:  START AMIODARONE for 10 days 400 mg (4 tablets) twice daily and after take 100 mg (1 Tablet) daily. STOP take ASPIRIN and IMDUR. *If you need a refill on your cardiac medications before your next appointment, please call your pharmacy*   Lab Work: CBC, TSH, CMP do this 1 week before Cardioversion.(01/05/2022). If you have labs (blood work) drawn today and your tests are completely normal, you will receive your results only by: [place] (if you have MyChart) OR A paper copy in the mail If you have any lab test that is abnormal or we need to change your treatment, we will call you to review the results.   Testing/Procedures: A chest x-ray takes a picture of the organs and structures inside the chest, including the heart, lungs, and blood vessels. This test can show several things, including, whether the heart is enlarges; whether fluid is building up in the lungs; and whether pacemaker / defibrillator leads are still in place. You can do this at [place] 1 Week before the Cardioversion. (01/05/2022).   Follow-Up: At [facility], you and your health needs are our priority.  As part of our continuing mission to provide you with exceptional heart care, we have created designated Provider Care Teams.  These Care Teams include your primary Cardiologist (physician) and Advanced Practice Providers (APPs -  Physician Assistants and Nurse Practitioners) who all work together to provide you with the care you need, when you need it.  We recommend signing up for the patient portal called "MyChart".  Sign up information is provided on this After Visit Summary.  MyChart is used to connect with patients for Virtual Visits (Telemedicine).  Patients are able to view lab/test results, encounter notes, upcoming appointments, etc.  Non-urgent messages can be sent to your provider as well.   To learn more about what you can do with MyChart, go to  [email or web address].    Your next appointment:   December (after 02/11/2022)  The format for your next appointment:   In Person  Provider:   DR. Phineas Inches  Other Instructions:  Dear Jason Dennis are scheduled for a Cardioversion on 01/12/2022 with Dr. Harl Bowie.  Please arrive at the [facility] (Main Entrance A) at [facility]: [address] [place], [place] [address] at 09:00 AM.  DIET: Nothing to eat or drink after midnight except a sip of water with medications (see medication instructions below)  FYI: For your safety, and to allow [place] to monitor your vital signs accurately during the surgery/procedure we request that   if you have artificial nails, gel coating, SNS etc. Please have those removed prior to your surgery/procedure. Not having the nail coverings /polish removed may result in cancellation or delay of your surgery/procedure.  Continue your anticoagulant: Eliquis  Labs:  CBC, BMET within 1 week before Cardioversion  Come to: Any [facility] location  You must have a responsible person to drive you home and stay in the waiting area during your procedure. Failure to do so could result in cancellation.  Bring your insurance cards.  *Special Note: Every effort is made to have your procedure done on time. Occasionally there are emergencies that occur at the hospital that may cause delays. Please be patient if a delay does occur.

## 2021-11-29 NOTE — Progress Notes (Signed)
Cardiology Office Note:    Date:  12/01/2021   ID:  Jason Dennis, DOB February 25, 1951, MRN [id number]  PCP:  Jason Dennis, Jason Dennis Cardiologist:  Jason Mayo, MD     Referring MD: Jason Pinto, MD   Chief Complaint  Patient presents with   Follow-up    Seen for Dr. Phineas Dennis    History of Present Illness:    Jason Dennis is a 71 y.o. male with a hx of hypertension, OSA on CPAP, GERD and recently diagnosed atrial fibrillation.  Previous cardiac MRI in June 2018 for unexplained syncope showed normal EF, no LGE, normal LA and RA size, mild MR.  Patient was prepping for colonoscopy when he was noted to be tachycardic and EKG showed A-fib.  He was started on Eliquis and beta-blocker.Patient was first seen by A-fib clinic on 5/9 and was immediately seen by Dr. Phineas Dennis on the same day.  He ultimately underwent outpatient DCCV on 07/27/2021 by Dr. Al Dennis.  Since cardioversion, he has been seen by Dr. Phineas Dennis on 10/12/2021 at which time he was maintaining sinus rhythm however was having chest discomfort and shortness of breath carrying heavy objects.  Myoview obtained on 10/24/2021 came back low risk, small defect of moderate reduction in uptake present in the apex location that was partially reversible, apical thinning consistent with artifact, the study was not gated due to atrial fibrillation.  He was started on Imdur 30 mg daily to help with his symptom.  If he continues to have worsening symptoms, we will consider cardiac catheterization in the future.  He is PCP also noted he was back in atrial fibrillation again.  Patient presents today for follow-up.  He has no cardiac awareness of atrial fibrillation.  He appears to be euvolemic on exam.  I discussed his case with Dr. Phineas Dennis, Jason Dennis recommended add amiodarone 400 mg twice a day for 10 days then go down to amiodarone 200 mg daily thereafter.  In 3 to 4 weeks for now, patient  will need CMP, TSH, chest x-ray, CBC and he will go for another cardioversion in 4 weeks for now.  He can follow-up with Jason Dennis in 3 months.  He has been unable to tolerate Imdur due to side effect of headache.  Risk and the benefit of the cardioversion has been explained to the patient, he has agreed to proceed.  Past Medical History:  Diagnosis Date   GERD (gastroesophageal reflux disease)    Hyperlipidemia    Obesity    Prediabetes    Sleep apnea    CPAP   Vitamin D deficiency     Past Surgical History:  Procedure Laterality Date   CARDIOVERSION N/A 07/27/2021   Procedure: CARDIOVERSION;  Surgeon: Jason Dresser, MD;  Location: [facility];  Service: Cardiovascular;  Laterality: N/A;   COLONOSCOPY  05/02/2016   JasonJacobs   SPINE SURGERY     [phone]    Current Medications: Current Meds  Medication Sig   acetaminophen (TYLENOL) 500 MG tablet Take 500 mg by mouth in the morning and at bedtime.   apixaban (ELIQUIS) 5 MG TABS tablet Take 1 tablet by mouth twice daily   atorvastatin (LIPITOR) 80 MG tablet Take  1 tablet  Daily  for Cholesterol                                                          /  TAKE                                           BY                             MOUTH   Cholecalciferol (VITAMIN D) 125 MCG (5000 UT) CAPS Take 5,000 Units by mouth daily.    cyclobenzaprine (FLEXERIL) 10 MG tablet Take 1/2 to 1 tablet 3 x day as needed for Muscle Spasm (Patient taking differently: Take 5-10 mg by mouth 3 (three) times daily as needed for muscle spasms. Take 1/2 to 1 tablet 3 x day as needed for Muscle Spasm)   ibuprofen (ADVIL) 800 MG tablet TAKE 1 TABLET BY MOUTH TWICE DAILY WITH MEALS AS NEEDED FOR PAIN AND FOR INFLAMMATION   Liniments (DEEP BLUE RELIEF) GEL Apply 1 application. topically daily as needed (knee pain).   Menthol, Topical Analgesic, (BLUE-EMU MAXIMUM STRENGTH) 2.5 % LIQD Apply 1 application. topically daily as needed (knee  pain).   metoprolol tartrate (LOPRESSOR) 25 MG tablet Take 1 tablet by mouth twice daily   Multiple Vitamins-Minerals (CENTRUM SILVER 50+MEN PO) Take 1 tablet by mouth daily.   Omega-3 Fatty Acids (FISH OIL) 1000 MG CAPS Take 1,000 mg by mouth daily.   omeprazole (PRILOSEC) 20 MG capsule Take 20 mg by mouth daily.   [DISCONTINUED] amiodarone (PACERONE) 100 MG tablet Take 1 tablet (100 mg total) by mouth daily.   [DISCONTINUED] aspirin EC 81 MG tablet Take 81 mg by mouth every other day. Swallow whole.   [DISCONTINUED] isosorbide mononitrate (IMDUR) 30 MG 24 hr tablet Take 1 tablet (30 mg total) by mouth daily.     Allergies:   Patient has no known allergies.   Social History   Socioeconomic History   Marital status: Married    Spouse name: Not on file   Number of children: Not on file   Years of education: Not on file   Highest education level: Not on file  Occupational History   Not on file  Tobacco Use   Smoking status: Former    Packs/day: 1.00    Years: 32.00    Total pack years: 32.00    Types: Cigarettes    Quit date: 02/03/1990    Years since quitting: 31.8   Smokeless tobacco: Never   Tobacco comments:    Former smoker 07/11/21  Vaping Use   Vaping Use: Never used  Substance and Sexual Activity   Alcohol use: Yes    Alcohol/week: 14.0 standard drinks of alcohol    Types: 14 Standard drinks or equivalent per week    Comment: 2-3 drinks per night per pt 07/11/21   Drug use: No   Sexual activity: Not Currently  Other Topics Concern   Not on file  Social History Narrative   Not on file   Social Determinants of Health   Financial Resource Strain: Not on file  Food Insecurity: Not on file  Transportation Needs: Not on file  Physical Activity: Not on file  Stress: Not on file  Social Connections: Not on file     Family History: The patient's family history includes COPD in his mother; Colon cancer in his father; Diabetes in his brother; Heart attack in his  father; Heart disease in his mother; Heart failure in his father and mother;  Hypertension in his mother. There is no history of Esophageal cancer, Rectal cancer, Stomach cancer, or Colon polyps.  ROS:   Please see the history of present illness.     All other systems reviewed and are negative.  EKGs/Labs/Other Studies Reviewed:    The following studies were reviewed today:  Myoview 10/24/2021   Findings are equivocal. The study is low risk.   No ST deviation was noted.   LV perfusion is abnormal. Defect 1: There is a small defect with moderate reduction in uptake present in the apical apex location(s) that is partially reversible. Apical thinning consistent with artifact.   End diastolic cavity size is normal. End systolic cavity size is normal.   Prior study not available for comparison.   Mildly abnormal, low risk stress nuclear study with probable apical thinning; cannot rule out very mild apical ischemia as defect is partially reversible; study not gated due to atrial fibrillation.  EKG:  EKG is ordered today.  The ekg ordered today demonstrates atrial fibrillation, heart rate 99 bpm.  Recent Labs: 11/27/2021: ALT 39; BUN 28; Creat 1.01; Hemoglobin 14.5; Magnesium 1.9; Platelets 230; Potassium 4.4; Sodium 141; TSH 2.00  Recent Lipid Panel    Component Value Date/Time   CHOL 168 11/27/2021 1111   TRIG 214 (H) 11/27/2021 1111   HDL 59 11/27/2021 1111   CHOLHDL 2.8 11/27/2021 1111   VLDL 30 09/12/2016 1147   LDLCALC 78 11/27/2021 1111     Risk Assessment/Calculations:    CHA2DS2-VASc Score = 2   This indicates a 2.2% annual risk of stroke. The patient's score is based upon: CHF History: 0 HTN History: 1 Diabetes History: 0 Stroke History: 0 Vascular Disease History: 0 Age Score: 1 Gender Score: 0          Physical Exam:    VS:  BP 136/76   Pulse 99   Ht '5\' 10"'$  (1.778 m)   Wt 215 lb 6.4 oz (97.7 kg)   SpO2 99%   BMI 30.91 kg/m        Wt Readings from Last  3 Encounters:  11/29/21 215 lb 6.4 oz (97.7 kg)  11/27/21 211 lb 12.8 oz (96.1 kg)  10/24/21 210 lb (95.3 kg)     GEN:  Well nourished, well developed in no acute distress HEENT: Normal NECK: No JVD; No carotid bruits LYMPHATICS: No lymphadenopathy CARDIAC: RRR, no murmurs, rubs, gallops RESPIRATORY:  Clear to auscultation without rales, wheezing or rhonchi  ABDOMEN: Soft, non-tender, non-distended MUSCULOSKELETAL:  No edema; No deformity  SKIN: Warm and dry NEUROLOGIC:  Alert and oriented x 3 PSYCHIATRIC:  Normal affect   ASSESSMENT:    1. Persistent atrial fibrillation ([place])   2. Medication management   3. Amiodarone toxicity, undetermined intent, subsequent encounter    PLAN:    In order of problems listed above:  Persistent atrial fibrillation: Patient was recently diagnosed with atrial fibrillation and underwent cardioversion on 07/27/2021.  Unfortunately he has went back into atrial fibrillation again.  I discussed the case with Dr. Phineas Dennis, we decided to start the patient on amiodarone at 400 mg twice a day for 10 days then go down to 200 mg daily thereafter.  He will need chest x-ray, TSH CBC and CMP in 2 to 4 weeks prior to cardioversion.  We will attempt another cardioversion in 4 weeks.  He is aware that he should not be missing any anticoagulation dosing, if he does miss a dose of anticoagulation, we have to reset  the clock and count another 3 weeks before he can undergo cardioversion.      Shared Decision Making/Informed Consent The risks (stroke, cardiac arrhythmias rarely resulting in the need for a temporary or permanent pacemaker, skin irritation or burns and complications associated with conscious sedation including aspiration, arrhythmia, respiratory failure and death), benefits (restoration of normal sinus rhythm) and alternatives of a direct current cardioversion were explained in detail to Mr. Flenner and he agrees to proceed.      Medication  Adjustments/Labs and Tests Ordered: Current medicines are reviewed at length with the patient today.  Concerns regarding medicines are outlined above.  Orders Placed This Encounter  Procedures   DG Chest 2 View   CBC   Comprehensive metabolic panel   TSH   EKG 12-Lead   Meds ordered this encounter  Medications   DISCONTD: amiodarone (PACERONE) 100 MG tablet    Sig: Take 1 tablet (100 mg total) by mouth daily.    Dispense:  60 tablet    Refill:  2   DISCONTD: amiodarone (PACERONE) 100 MG tablet    Sig: Take 1 tablet (100 mg total) by mouth daily. For 10 days take 400 mg twice daily and after that take 100 mg daily.    Dispense:  100 tablet    Refill:  1    Patient Instructions  Medication Instructions:  START AMIODARONE for 10 days 400 mg (4 tablets) twice daily and after take 100 mg (1 Tablet) daily. STOP take ASPIRIN and IMDUR. *If you need a refill on your cardiac medications before your next appointment, please call your pharmacy*   Lab Work: CBC, TSH, CMP do this 1 week before Cardioversion.(01/05/2022). If you have labs (blood work) drawn today and your tests are completely normal, you will receive your results only by: [facility] (if you have MyChart) OR A paper copy in the mail If you have any lab test that is abnormal or we need to change your treatment, we will call you to review the results.   Testing/Procedures: A chest x-ray takes a picture of the organs and structures inside the chest, including the heart, lungs, and blood vessels. This test can show several things, including, whether the heart is enlarges; whether fluid is building up in the lungs; and whether pacemaker / defibrillator leads are still in place. You can do this at [facility] 1 Week before the Cardioversion. (01/05/2022).   Follow-Up: At [facility], you and your health needs are our priority.  As part of our continuing mission to provide you with exceptional heart care, we  have created designated Provider Care Teams.  These Care Teams include your primary Cardiologist (physician) and [facility] (APPs -  Physician Assistants and Nurse Practitioners) who all work together to provide you with the care you need, when you need it.  We recommend signing up for the patient portal called "MyChart".  Sign up information is provided on this After Visit Summary.  MyChart is used to connect with patients for Virtual Visits (Telemedicine).  Patients are able to view lab/test results, encounter notes, upcoming appointments, etc.  Non-urgent messages can be sent to your provider as well.   To learn more about what you can do with MyChart, go to [email or web address].    Your next appointment:   December (after 02/11/2022)  The format for your next appointment:   In Person  Provider:   DR. Phineas Dennis  Other Instructions:  Dear Jason Dennis are scheduled  for a Cardioversion on 01/12/2022 with Jason Dennis.  Please arrive at the [facility] (Main Entrance A) at [facility] [address] [place], [place] [address] at 09:00 AM.  DIET: Nothing to eat or drink after midnight except a sip of water with medications (see medication instructions below)  FYI: For your safety, and to allow [place] to monitor your vital signs accurately during the surgery/procedure we request that   if you have artificial nails, gel coating, SNS etc. Please have those removed prior to your surgery/procedure. Not having the nail coverings /polish removed may result in cancellation or delay of your surgery/procedure.  Continue your anticoagulant: Eliquis  Labs:  CBC, BMET within 1 week before Cardioversion  Come to: Any [facility] location  You must have a responsible person to drive you home and stay in the waiting area during your procedure. Failure to do so could result in cancellation.  Bring your insurance cards.  *Special Note: Every effort is made to have your procedure  done on time. Occasionally there are emergencies that occur at the hospital that may cause delays. Please be patient if a delay does occur.     Hilbert Corrigan, [place]  12/01/2021 11:55 PM    [place]

## 2021-11-30 ENCOUNTER — Telehealth: Payer: Self-pay | Admitting: Internal Medicine

## 2021-11-30 NOTE — Telephone Encounter (Signed)
Spoke to patient's wife stated she needs clarification on Amiodarone instructions. Spoke to [place] she advised take Amiodarone 400 mg twice a day for 10 days then take 200 mg daily. Stated since pharmacy will not take back the 100 mg tablets  husband will use those up and she will call back for a 200 mg tablet.

## 2021-11-30 NOTE — Telephone Encounter (Signed)
Pt c/o medication issue:  1. Name of Medication:   amiodarone (PACERONE) 100 MG tablet  2. How are you currently taking this medication (dosage and times per day)?  Took 2 tablets today  3. Are you having a reaction (difficulty breathing--STAT)? N/A  4. What is your medication issue?   Wife would like confirmation on the dosage and how to take this medication.

## 2021-12-01 ENCOUNTER — Encounter: Payer: Self-pay | Admitting: Physician Assistant

## 2021-12-02 ENCOUNTER — Other Ambulatory Visit: Payer: Self-pay | Admitting: Physician Assistant

## 2021-12-02 DIAGNOSIS — I4819 Other persistent atrial fibrillation: Secondary | ICD-10-CM

## 2021-12-06 ENCOUNTER — Telehealth: Payer: Self-pay | Admitting: Internal Medicine

## 2021-12-06 DIAGNOSIS — G4733 Obstructive sleep apnea (adult) (pediatric): Secondary | ICD-10-CM | POA: Diagnosis not present

## 2021-12-06 NOTE — Telephone Encounter (Signed)
Pt c/o medication issue:  1. Name of Medication: Cyclobenzaprine  2. How are you currently taking this medication (dosage and times per day)?   3. Are you having a reaction (difficulty breathing--STAT)?   4. What is your medication issue? Wants to know if it is alright to take this medicine for his Arthritidis along with all of his other medicine?

## 2021-12-07 NOTE — Telephone Encounter (Signed)
Returned call to patients wife(okay per DPR) and advised her to pharmacy's recommendations. Patients wife verbalized understanding.

## 2021-12-07 NOTE — Telephone Encounter (Signed)
Ok to take. Patient should be aware that medication can cause drowsiness

## 2021-12-07 NOTE — Telephone Encounter (Signed)
Patient's wife is following up. Please advise if alright to take muscle relaxant.

## 2021-12-07 NOTE — Telephone Encounter (Signed)
Wife notified that it is ok to take and that it may make Pt drowsy.

## 2021-12-13 ENCOUNTER — Telehealth: Payer: Self-pay | Admitting: Internal Medicine

## 2021-12-13 MED ORDER — AMIODARONE HCL 200 MG PO TABS: 200.0000 mg | ORAL_TABLET | Freq: Every day | ORAL | 3 refills | Status: DC

## 2021-12-13 NOTE — Telephone Encounter (Signed)
-  Wife called stating they received a new refill for Amiodarone 100 mg and its supposed to be 200 mg. -Per chart review, order was for 400 mg BID for 10 days, then 200 mg daily thereafter. -New Rx sent to pharmacy as requested.

## 2021-12-13 NOTE — Telephone Encounter (Signed)
Pt c/o medication issue:  1. Name of Medication: amiodarone (PACERONE) 200 MG tablet  2. How are you currently taking this medication (dosage and times per day)? Two 100 mg tablets daily   3. Are you having a reaction (difficulty breathing--STAT)? No   4. What is your medication issue? Patient's wife is calling stating 100 mg tablets were sent to the pharmacy for his last prescription. They were advised when he is running low to call the office and request 200 mg tablets be sent instead. Patient only has 6 tablets of the 100 mg medication left. Please advise.

## 2021-12-14 ENCOUNTER — Ambulatory Visit (INDEPENDENT_AMBULATORY_CARE_PROVIDER_SITE_OTHER): Payer: Medicare HMO

## 2021-12-14 VITALS — Temp 97.9°F

## 2021-12-14 DIAGNOSIS — Z23 Encounter for immunization: Secondary | ICD-10-CM

## 2022-01-05 ENCOUNTER — Ambulatory Visit
Admission: RE | Admit: 2022-01-05 | Discharge: 2022-01-05 | Disposition: A | Discharge status: Home / Self Care | Payer: Medicare HMO | Source: Ambulatory Visit | Attending: Physician Assistant | Admitting: Physician Assistant

## 2022-01-05 DIAGNOSIS — Z79899 Other long term (current) drug therapy: Secondary | ICD-10-CM

## 2022-01-05 DIAGNOSIS — I4819 Other persistent atrial fibrillation: Secondary | ICD-10-CM | POA: Diagnosis not present

## 2022-01-05 DIAGNOSIS — T462X4D Poisoning by other antidysrhythmic drugs, undetermined, subsequent encounter: Secondary | ICD-10-CM | POA: Diagnosis not present

## 2022-01-05 DIAGNOSIS — I4891 Unspecified atrial fibrillation: Secondary | ICD-10-CM | POA: Diagnosis not present

## 2022-01-06 LAB — COMPREHENSIVE METABOLIC PANEL
ALT: 37 IU/L (ref 0–44)
AST: 23 IU/L (ref 0–40)
Albumin/Globulin Ratio: 2.6 — ABNORMAL HIGH (ref 1.2–2.2)
Albumin: 4.7 g/dL (ref 3.8–4.8)
Alkaline Phosphatase: 100 IU/L (ref 44–121)
BUN/Creatinine Ratio: 21 (ref 10–24)
BUN: 19 mg/dL (ref 8–27)
Bilirubin Total: 0.6 mg/dL (ref 0.0–1.2)
CO2: 22 mmol/L (ref 20–29)
Calcium: 9.8 mg/dL (ref 8.6–10.2)
Chloride: 107 mmol/L — ABNORMAL HIGH (ref 96–106)
Creatinine, Ser: 0.91 mg/dL (ref 0.76–1.27)
Globulin, Total: 1.8 g/dL (ref 1.5–4.5)
Glucose: 98 mg/dL (ref 70–99)
Potassium: 4.7 mmol/L (ref 3.5–5.2)
Sodium: 142 mmol/L (ref 134–144)
Total Protein: 6.5 g/dL (ref 6.0–8.5)
eGFR: 90 mL/min/{1.73_m2} (ref >59)

## 2022-01-06 LAB — CBC
Hematocrit: 42.1 % (ref 37.5–51.0)
Hemoglobin: 14.2 g/dL (ref 13.0–17.7)
MCH: 32.1 pg (ref 26.6–33.0)
MCHC: 33.7 g/dL (ref 31.5–35.7)
MCV: 95 fL (ref 79–97)
Platelets: 215 10*3/uL (ref 150–450)
RBC: 4.43 x10E6/uL (ref 4.14–5.80)
RDW: 12 % (ref 11.6–15.4)
WBC: 5.3 10*3/uL (ref 3.4–10.8)

## 2022-01-06 LAB — TSH: TSH: 4.54 u[IU]/mL — ABNORMAL HIGH (ref 0.450–4.500)

## 2022-01-09 NOTE — Progress Notes (Signed)
No sign of pulmonary toxicity seen on chest pain from amiodarone. Normal chest x ray result.

## 2022-01-09 NOTE — Progress Notes (Signed)
Normal red blood cell count, normal renal function and electrolyte, normal liver function. TSH borderline high when compare to the previous lab, this may be related to amiodarone, recommend continue amiodarone and repeat TSH and free T4 in 4 weeks

## 2022-01-10 ENCOUNTER — Other Ambulatory Visit: Payer: Self-pay | Admitting: *Deleted

## 2022-01-10 DIAGNOSIS — R7989 Other specified abnormal findings of blood chemistry: Secondary | ICD-10-CM

## 2022-01-11 DIAGNOSIS — G4733 Obstructive sleep apnea (adult) (pediatric): Secondary | ICD-10-CM | POA: Diagnosis not present

## 2022-01-12 ENCOUNTER — Ambulatory Visit (HOSPITAL_COMMUNITY)
Admission: RE | Admit: 2022-01-12 | Discharge: 2022-01-12 | Disposition: A | Discharge status: Home / Self Care | Payer: Medicare HMO | Attending: Internal Medicine | Admitting: Internal Medicine

## 2022-01-12 ENCOUNTER — Encounter (HOSPITAL_COMMUNITY)
Admission: RE | Disposition: A | Discharge status: Home / Self Care | Payer: Self-pay | Source: Home / Self Care | Attending: Internal Medicine

## 2022-01-12 DIAGNOSIS — Z539 Procedure and treatment not carried out, unspecified reason: Secondary | ICD-10-CM | POA: Insufficient documentation

## 2022-01-12 DIAGNOSIS — I4819 Other persistent atrial fibrillation: Secondary | ICD-10-CM

## 2022-01-12 SURGERY — CANCELLED PROCEDURE

## 2022-01-12 NOTE — OR Nursing (Signed)
Patient arrived in [facility], EKG obtained. Dr Harl Bowie spoke with patient and procedure cancelled.

## 2022-01-13 DIAGNOSIS — M1712 Unilateral primary osteoarthritis, left knee: Secondary | ICD-10-CM | POA: Diagnosis not present

## 2022-01-13 DIAGNOSIS — M170 Bilateral primary osteoarthritis of knee: Secondary | ICD-10-CM | POA: Diagnosis not present

## 2022-01-13 DIAGNOSIS — M1711 Unilateral primary osteoarthritis, right knee: Secondary | ICD-10-CM | POA: Diagnosis not present

## 2022-01-16 ENCOUNTER — Telehealth: Payer: Self-pay | Admitting: Internal Medicine

## 2022-01-16 NOTE — Telephone Encounter (Signed)
Returned call to patients wife (okay per DPR) who states she had received blood work paper work in [employer] and was wanting to know if he had to come in now for it. Advised patient per Almyra Deforest PA-C recheck TSH + T3/T4 in 4 weeks around December 8th. Patient's wife aware and verbalized understanding.    [place], [place] 01/09/2022 12:59 PM EST     Normal red blood cell count, normal renal function and electrolyte, normal liver function. TSH borderline high when compare to the previous lab, this may be related to amiodarone, recommend continue amiodarone and repeat TSH and free T4 in 4 weeks

## 2022-01-16 NOTE — Telephone Encounter (Signed)
Wife called wanting to know the reason the patient has been scheduled for lab work.  Wife stated the patient's cardioversion was canceled and the patient has been doing well on medication.

## 2022-02-01 ENCOUNTER — Telehealth: Payer: Self-pay | Admitting: Internal Medicine

## 2022-02-01 NOTE — Telephone Encounter (Signed)
Spouse asked if it is ok for patient to use [place] Dream cream for arthritis. Please advise.

## 2022-02-01 NOTE — Telephone Encounter (Signed)
It is ok to use [place] Dream Cream with heart medications - spoke to spouse.

## 2022-02-01 NOTE — Telephone Encounter (Signed)
Patient's wife called wanting to know if it would be okay for patient to use a cream for arthritis called [place] Dream Cream.

## 2022-02-02 ENCOUNTER — Telehealth: Payer: Self-pay | Admitting: Internal Medicine

## 2022-02-02 NOTE — Telephone Encounter (Signed)
Wife returned RN's call and stated patient will do lab work when he comes to his appointment on 12/14.

## 2022-02-02 NOTE — Telephone Encounter (Signed)
noted 

## 2022-02-07 ENCOUNTER — Telehealth: Payer: Self-pay | Admitting: Internal Medicine

## 2022-02-07 NOTE — Telephone Encounter (Signed)
Called patient regarding fax request for CPAP machine replacement and supplies via [employer] [facility]. Per Dr. [name], called patient to advise outcome of request. Spoke with spouse, Helene Kelp. No documented sleep study in chart. Referral to sleep medicine done in 2016, never completed by patient. Dr. Melford Aase recommends a new sleep study since over 5 years since formal sleep study. Spouse will talk to patient and call back if agreeable to a sleep study. Faxed denial for CPAP machine and supplies to [employer] with explanation.

## 2022-02-10 DIAGNOSIS — G4733 Obstructive sleep apnea (adult) (pediatric): Secondary | ICD-10-CM | POA: Diagnosis not present

## 2022-02-14 ENCOUNTER — Other Ambulatory Visit: Payer: Self-pay | Admitting: Internal Medicine

## 2022-02-14 DIAGNOSIS — E782 Mixed hyperlipidemia: Secondary | ICD-10-CM

## 2022-02-15 ENCOUNTER — Ambulatory Visit: Payer: Medicare HMO | Attending: Internal Medicine | Admitting: Internal Medicine

## 2022-02-15 ENCOUNTER — Encounter: Payer: Self-pay | Admitting: Internal Medicine

## 2022-02-15 VITALS — BP 132/70 | HR 55 | Ht 70.0 in | Wt 212.8 lb

## 2022-02-15 DIAGNOSIS — I4891 Unspecified atrial fibrillation: Secondary | ICD-10-CM

## 2022-02-15 MED ORDER — METOPROLOL SUCCINATE ER 50 MG PO TB24: 50.0000 mg | ORAL_TABLET | Freq: Every day | ORAL | 3 refills | Status: DC

## 2022-02-15 NOTE — Progress Notes (Signed)
Cardiology Office Note:    Date:  02/15/2022   ID:  Jason Dennis, DOB 1951/01/11, MRN [id number]  PCP:  Jason Pinto, MD   [facility] Providers Cardiologist:  Jason Mayo, MD     Referring MD: Jason Pinto, MD   No chief complaint on file. New Onset atrial fibrillation  History of Present Illness:    Jason Dennis is a 71 y.o. male with a hx of GERD, HTN, sleep apnea on CPAP, who presented to the ED 5/3 for new onset afib  He was prepping for colonoscopy and then he was noted to have tachycardia and EKG showed afib. He was evaluated in the ED. Started on eliquis and BB. He is asymptomatic currently.  He was seen in afib clinic today. Recommended DCCV. Wanted to follow up here first. This is his first visit. He has no other cardiac disease history.  Interim 63/10 Hx Jason Dennis comes in today with his wife. He is s/p DCCV. He converted to NSR. He denies further palpitations. He notes that he has noticed chest tightness and SOB with carrying heavy cases. He works at the [employer] and noticed this recently.  Interim 02/15/2022 He had CP last visit, myoview showing apical thinning. In September he had an afib episode. I started him on amiodarone.He saw Jason Dennis. He's in sinus. No SOB. No fatigue. Has knee pain. No CP.   EKG 5/3- Afib HR 110 bpm EKG 07/11/2021- Afib 87 bpm  CMR 08/17/2016 Indication unexplained syncope EF normal No LGE Normal LA/RA size Mild MR  Past Medical History:  Diagnosis Date   GERD (gastroesophageal reflux disease)    Hyperlipidemia    Obesity    Prediabetes    Sleep apnea    CPAP   Vitamin D deficiency     Past Surgical History:  Procedure Laterality Date   CARDIOVERSION N/A 07/27/2021   Procedure: CARDIOVERSION;  Surgeon: Buford Dresser, MD;  Location: [facility];  Service: Cardiovascular;  Laterality: N/A;   COLONOSCOPY  05/02/2016   Dr.Jacobs   SPINE SURGERY     [phone]    Current Medications: No outpatient  medications have been marked as taking for the 02/15/22 encounter (Appointment) with Jason Mayo, MD.     Allergies:   Patient has no known allergies.   Social History   Socioeconomic History   Marital status: Married    Spouse name: Not on file   Number of children: Not on file   Years of education: Not on file   Highest education level: Not on file  Occupational History   Not on file  Tobacco Use   Smoking status: Former    Packs/day: 1.00    Years: 32.00    Total pack years: 32.00    Types: Cigarettes    Quit date: 02/03/1990    Years since quitting: 32.0   Smokeless tobacco: Never   Tobacco comments:    Former smoker 07/11/21  Vaping Use   Vaping Use: Never used  Substance and Sexual Activity   Alcohol use: Yes    Alcohol/week: 14.0 standard drinks of alcohol    Types: 14 Standard drinks or equivalent per week    Comment: 2-3 drinks per night per pt 07/11/21   Drug use: No   Sexual activity: Not Currently  Other Topics Concern   Not on file  Social History Narrative   Not on file   Social Determinants of Health   Financial Resource Strain: Not on file  Food Insecurity:  Not on file  Transportation Needs: Not on file  Physical Activity: Not on file  Stress: Not on file  Social Connections: Not on file     Family History: The patient's family history includes COPD in his mother; Colon cancer in his father; Diabetes in his brother; Heart attack in his father; Heart disease in his mother; Heart failure in his father and mother; Hypertension in his mother. There is no history of Esophageal cancer, Rectal cancer, Stomach cancer, or Colon polyps.  ROS:   Please see the history of present illness.     All other systems reviewed and are negative.  EKGs/Labs/Other Studies Reviewed:    The following studies were reviewed today:   EKG:  EKG is  ordered today.  The ekg ordered today demonstrates   07/11/2021-Afib HR 87 QTc 438 ms  Recent Labs: 11/27/2021:  Magnesium 1.9 01/05/2022: ALT 37; BUN 19; Creatinine, Ser 0.91; Hemoglobin 14.2; Platelets 215; Potassium 4.7; Sodium 142; TSH 4.540   Recent Lipid Panel    Component Value Date/Time   CHOL 168 11/27/2021 1111   TRIG 214 (H) 11/27/2021 1111   HDL 59 11/27/2021 1111   CHOLHDL 2.8 11/27/2021 1111   VLDL 30 09/12/2016 1147   LDLCALC 78 11/27/2021 1111     Risk Assessment/Calculations:    CHA2DS2-VASc Score = 2   This indicates a 2.2% annual risk of stroke. The patient's score is based upon: CHF History: 0 HTN History: 1 Diabetes History: 0 Stroke History: 0 Vascular Disease History: 0 Age Score: 1 Gender Score: 0          Physical Exam:    VS:  Vitals:   02/15/22 0811  BP: 132/70  Pulse: (!) 55  SpO2: 95%    Wt Readings from Last 3 Encounters:  11/29/21 215 lb 6.4 oz (97.7 kg)  11/27/21 211 lb 12.8 oz (96.1 kg)  10/24/21 210 lb (95.3 kg)     GEN:  Well nourished, well developed in no acute distress HEENT: Normal NECK: No JVD; No carotid bruits LYMPHATICS: No lymphadenopathy CARDIAC: R,R,R, no murmurs, rubs, gallops RESPIRATORY:  Clear to auscultation without rales, wheezing or rhonchi  ABDOMEN: Soft, non-tender, non-distended MUSCULOSKELETAL:  No edema; No deformity  SKIN: Warm and dry NEUROLOGIC:  Alert and oriented x 3 PSYCHIATRIC:  Normal affect   ASSESSMENT:    #Paroxysmal Afib:  CHADS2VASC=2 We discussed sleep apnea being associated and he is compliant with his cpap. No thyroid dx. Prior MR in 2018 showed normal EF and LA size. Would be a good PVI/AAD candidate if persistent afib develops. He is s/p DCCV with 150Jx1 07/27/2021 and converted to NSR. Started amiodarone in 11/2021. In sinus rhythm - continue amiodarone 200 mg daily - continue metop tartrate 25 mg BID-->metoprolol XL 50 mg daily - continue eliquis 5 mg BID [he can stop his eliquis prior to any surgeries; unless he is planned for DCCV, or just had one]  #HLD: continue atorvastatin 80 mg  daily  PLAN:    In order of problems listed above:   Change tartrate 25 mg BID to XL (metop XL 50 mg daily) Follow up 12 months           Medication Adjustments/Labs and Tests Ordered: Current medicines are reviewed at length with the patient today.  Concerns regarding medicines are outlined above.  No orders of the defined types were placed in this encounter.  No orders of the defined types were placed in this encounter.   There are  no Patient Instructions on file for this visit.   Signed, Jason Mayo, MD  02/15/2022 8:03 AM    [facility]

## 2022-02-15 NOTE — Patient Instructions (Signed)
Medication Instructions:  Your physician has recommended you make the following change in your medication:  STOP: Metoprolol tartrate 25 mg twice daily  START: Metoprolol Succinate '50mg'$  ONCE daily  *If you need a refill on your cardiac medications before your next appointment, please call your pharmacy*   Lab Work: NONE If you have labs (blood work) drawn today and your tests are completely normal, you will receive your results only by: [place] (if you have MyChart) OR A paper copy in the mail If you have any lab test that is abnormal or we need to change your treatment, we will call you to review the results.   Testing/Procedures: NONE   Follow-Up: At [facility], you and your health needs are our priority.  As part of our continuing mission to provide you with exceptional heart care, we have created designated Provider Care Teams.  These Care Teams include your primary Cardiologist (physician) and Advanced Practice Providers (APPs -  Physician Assistants and Nurse Practitioners) who all work together to provide you with the care you need, when you need it.  We recommend signing up for the patient portal called "MyChart".  Sign up information is provided on this After Visit Summary.  MyChart is used to connect with patients for Virtual Visits (Telemedicine).  Patients are able to view lab/test results, encounter notes, upcoming appointments, etc.  Non-urgent messages can be sent to your provider as well.   To learn more about what you can do with MyChart, go to [email or web address].    Your next appointment:   1 year(s)  The format for your next appointment:   In Person  Provider:   [name], MD

## 2022-02-21 ENCOUNTER — Ambulatory Visit: Payer: Medicare HMO | Admitting: Nurse Practitioner

## 2022-02-28 NOTE — Progress Notes (Unsigned)
ANNUAL MEDICARE WELLNESS VISIT AND FOLLOW UP Assessment:   Encounter for Medicare annual wellness exam 1 year  Essential hypertension - continue medications, DASH diet, exercise and monitor at home. Call if greater than 130/80.  -     CBC with Differential/Platelet -     CMP/GFR -     TSH  Paroxysmal Atrial fibrillation Follows with Dr. Harl Bowie Was in rhythm at 02/15/22 visit with Dr. Harl Bowie Continue Amiodarone, Metoprolol and Eliquis  OSA on CPAP Weight loss advised, continue CPAP  Mixed hyperlipidemia -continue medications, check lipids, decrease fatty foods, increase activity.  -     Lipid panel  Abnormal glucose Discussed general issues about diabetes pathophysiology and management., Educational material distributed., Suggested low cholesterol diet., Encouraged aerobic exercise., Discussed foot care., Reminded to get yearly retinal exam.  Medication management -     TSH  Gastroesophageal reflux disease, esophagitis presence not specified Continue PPI/H2 blocker, diet discussed  Vitamin D deficiency Continue supplement  Morbid obesity ([place]) - BMI 30 + with OSA Long discussion about weight loss, diet, and exercise Recommended diet heavy in fruits and veggies and low in animal meats, cheeses, and dairy products, appropriate calorie intake Patient will work on decreasing saturated fats and simple carbs, increase activity Follow up at next visit  Former smoker Monitor  Skin lesion right ear Refer to dermatology for evaluation  Chronic pain of both knees Continue to follow with flexogenics, if symptoms worsen can refer to ortho for evaluation for replacement  Over 30 minutes of exam, counseling, chart review, and critical decision making was performed  Future Appointments  Date Time Provider [place]  04/12/2022  4:20 PM [name], MD [facility] None  05/31/2022  3:30 PM [name], MD [facility] None  09/04/2022  4:00 PM [name], NP  [facility] None  11/30/2022 11:00 AM [name], MD [facility] None     Plan:   During the course of the visit the patient was educated and counseled about appropriate screening and preventive services including:   Pneumococcal vaccine  Influenza vaccine Prevnar 13 Td vaccine Screening electrocardiogram Colorectal cancer screening Diabetes screening Glaucoma screening Nutrition counseling    Subjective:  Jason Dennis is a 71 y.o. male who presents for Medicare Annual Wellness Visit and 3 month follow up for Paroxysmal Atrial Fibrillation, HTN, hyperlipidemia, glucose, and vitamin D Def.   He reports chronic right knee pain, previously getting injections by ortho(flexogenics). Is getting plasma injections. Had last one 11/10/ 2023. Currently is currently semi controlled.   BMI is Body mass index is 30.33 kg/m., he is not currently working on diet and exercise.  He is on CPAP for OSA. 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 Trying to walk for exercise.  Wt Readings from Last 3 Encounters:  03/01/22 211 lb 6.4 oz (95.9 kg)  02/15/22 212 lb 12.8 oz (96.5 kg)  11/29/21 215 lb 6.4 oz (97.7 kg)    He is being followed by Dr. Harl Bowie for paroxysmal atrial fibrillation that was discovered at colonoscopy. Plans to get colonoscopy next year. Had cardioversion 07/27/21. Is currently in sinus rhythm on medications.  His blood pressure has been controlled at home normally running 130/70's, currently on metoprolol 50 mg QD and amiodarone 200 mg QD, today their BP is BP: (!) 170/82  BP Readings from Last 3 Encounters:  03/01/22 (!) 170/82  02/15/22 132/70  11/29/21 136/76   He does workout. He denies chest  pain, shortness of breath, dizziness.   He is on cholesterol medication, Atorvastatin 80 mg QD,  and denies myalgias. His cholesterol is at goal. The cholesterol last visit was:   Lab Results  Component Value Date   CHOL 168 11/27/2021    HDL 59 11/27/2021   LDLCALC 78 11/27/2021   TRIG 214 (H) 11/27/2021   CHOLHDL 2.8 11/27/2021   He does have diet controlled prediabetes.  He reports that he is doing well with diet and exercise.   Lab Results  Component Value Date   HGBA1C 5.4 11/27/2021    He has not been drinking as much water. Last GFR Lab Results  Component Value Date   EGFR 90 01/05/2022     Patient is on Vitamin D supplement.   Lab Results  Component Value Date   VD25OH 50 11/27/2021       Medication Review: Current Outpatient Medications on File Prior to Visit  Medication Sig Dispense Refill   acetaminophen (TYLENOL) 500 MG tablet Take 1,000 mg by mouth in the morning and at bedtime.     amiodarone (PACERONE) 200 MG tablet Take 1 tablet (200 mg total) by mouth daily. 90 tablet 3   apixaban (ELIQUIS) 5 MG TABS tablet Take 1 tablet by mouth twice daily 60 tablet 5   atorvastatin (LIPITOR) 80 MG tablet TAKE 1 TABLET BY MOUTH ONCE DAILY FOR CHOLESTEROL 90 tablet 0   Cholecalciferol (VITAMIN D) 125 MCG (5000 UT) CAPS Take 5,000 Units by mouth daily.      cyclobenzaprine (FLEXERIL) 10 MG tablet Take 1/2 to 1 tablet 3 x day as needed for Muscle Spasm (Patient taking differently: Take 5-10 mg by mouth 3 (three) times daily as needed for muscle spasms. Take 1/2 to 1 tablet 3 x day as needed for Muscle Spasm) 90 tablet 0   ibuprofen (ADVIL) 800 MG tablet TAKE 1 TABLET BY MOUTH TWICE DAILY WITH MEALS AS NEEDED FOR PAIN AND FOR INFLAMMATION 180 tablet 0   metoprolol succinate (TOPROL-XL) 50 MG 24 hr tablet Take 1 tablet (50 mg total) by mouth daily. Take with or immediately following a meal. 90 tablet 3   Multiple Vitamins-Minerals (CENTRUM SILVER 50+MEN PO) Take 1 tablet by mouth daily.     Omega-3 Fatty Acids (FISH OIL) 1000 MG CAPS Take 1,000 mg by mouth daily.     omeprazole (PRILOSEC) 20 MG capsule Take 20 mg by mouth daily.     Liniments (DEEP BLUE RELIEF) GEL Apply 1 application. topically daily as needed (knee  pain). (Patient not taking: Reported on 03/01/2022)     Menthol, Topical Analgesic, (BLUE-EMU MAXIMUM STRENGTH) 2.5 % LIQD Apply 1 application. topically daily as needed (knee pain). (Patient not taking: Reported on 03/01/2022)     No current facility-administered medications on file prior to visit.    Allergies: No Known Allergies  Current Problems (verified) has Hyperlipidemia, mixed; Hypertension; Abnormal glucose; Vitamin D deficiency; Overweight (BMI 25.0-29.9); OSA on CPAP; GERD (gastroesophageal reflux disease); Medication management; Tear of left supraspinatus tendon; Impingement syndrome of left shoulder; Tendinopathy of left biceps; FHx: heart disease; Former smoker (32 pack year, quit 1991); Morbid obesity ([place]); Persistent atrial fibrillation ([place]); and Secondary hypercoagulable state ([place]) on their problem list.  Screening Tests Immunization History  Administered Date(s) Administered   Influenza Inj Mdck Quad With Preservative 02/18/2020   Influenza, High Dose Seasonal PF 10/21/2015, 12/24/2016, 01/10/2018, 11/20/2018, 12/22/2020, 12/14/2021   PPD Test 11/10/2013, 11/11/2014   Pneumococcal Conjugate-13 10/21/2015   Pneumococcal Polysaccharide-23  05/31/2016   Pneumococcal-Unspecified 04/15/2009   Td 11/17/2004   Tdap 11/11/2014   Zoster, Live 10/28/2012   Health Maintenance  Topic Date Due   Zoster Vaccines- Shingrix (1 of 2) 05/31/2022 (Originally 09/27/1969)   COLONOSCOPY (Pts 45-2yr Insurance coverage will need to be confirmed)  03/02/2023 (Originally 05/02/2021)   Medicare Annual Wellness (AWV)  03/02/2023   DTaP/Tdap/Td (3 - Td or Tdap) 11/10/2024   Pneumonia Vaccine 71 Years old  Completed   INFLUENZA VACCINE  Completed   Hepatitis C Screening  Completed   HPV VACCINES  Aged Out   COVID-19 Vaccine  Discontinued     Names of Other Physician/Practitioners you currently use: 1.  [facility] here for primary care 2. Dr.  GDelman Cheadle eye doctor, last visit 07/2021 3. Dr. HAggie Moats dentist, last visit 12/2021  Patient Care Team: MUnk Pinto MD as PCP - General BHarl BowieMRoyetta Crochet MD as PCP - Cardiology (Cardiology)  Surgical: He  has a past surgical history that includes Spine surgery; Colonoscopy (05/02/2016); and Cardioversion (N/A, 07/27/2021). Family His family history includes COPD in his mother; Colon cancer in his father; Diabetes in his brother; Heart attack in his father; Heart disease in his mother; Heart failure in his father and mother; Hypertension in his mother. Social history  He reports that he quit smoking about 32 years ago. His smoking use included cigarettes. He has a 32.00 pack-year smoking history. He has never used smokeless tobacco. He reports current alcohol use of about 14.0 standard drinks of alcohol per week. He reports that he does not use drugs.  MEDICARE WELLNESS OBJECTIVES: Physical activity:   Cardiac risk factors:   Depression/mood screen:      03/01/2022    3:39 PM  Depression screen PHQ 2/9  Decreased Interest 0  Down, Depressed, Hopeless 0  PHQ - 2 Score 0    ADLs:     11/26/2021    6:58 PM  In your present state of health, do you have any difficulty performing the following activities:  Hearing? 0  Vision? 0  Difficulty concentrating or making decisions? 0  Walking or climbing stairs? 0  Dressing or bathing? 0  Doing errands, shopping? 0    Cognitive Testing  Alert? Yes  Normal Appearance?Yes  Oriented to person? Yes  Place? Yes   Time? Yes  Recall of three objects?  Yes  Can perform simple calculations? Yes  Displays appropriate judgment?Yes  Can read the correct time from a watch face?Yes  EOL planning: Does Patient Have a Medical Advance Directive?: Yes Type of Advance Directive: Healthcare Power of Attorney, Living will Does patient want to make changes to medical advance directive?: No - Patient declined Copy of [place] Chart?:  No - copy requested   Objective:   Today's Vitals   03/01/22 1527  BP: (!) 170/82  Pulse: 60  Temp: (!) 97.3 F (36.3 C)  SpO2: 96%  Weight: 211 lb 6.4 oz (95.9 kg)     Body mass index is 30.33 kg/m.  General appearance: alert, no distress, WD/WN, male HEENT: normocephalic, sclerae anicteric, TMs pearly, nares patent, no discharge or erythema, pharynx normal Oral cavity: MMM, no lesions Neck: supple, no lymphadenopathy, no thyromegaly, no masses Heart: RRR, normal S1, S2, no murmurs Lungs: CTA bilaterally, no wheezes, rhonchi, or rales Abdomen: +bs, soft, non tender, non distended, no masses, no hepatomegaly, no splenomegaly Musculoskeletal: nontender, no swelling, no obvious deformity Extremities: no edema, no cyanosis, no clubbing  Pulses: 2+ symmetric, upper and lower extremities, normal cap refill Neurological: alert, oriented x 3, CN2-12 intact, strength normal upper extremities and lower extremities, sensation normal throughout, DTRs 2+ throughout, no cerebellar signs, gait normal Skin: < 1 cm blackened area new on pt on right ear Psychiatric: normal affect, behavior normal, pleasant   Medicare Attestation I have personally reviewed: The patient's medical and social history Their use of alcohol, tobacco or illicit drugs Their current medications and supplements The patient's functional ability including ADLs,fall risks, home safety risks, cognitive, and hearing and visual impairment Diet and physical activities Evidence for depression or mood disorders  The patient's weight, height, BMI, and visual acuity have been recorded in the chart.  I have made referrals, counseling, and provided education to the patient based on review of the above and I have provided the patient with a written personalized care plan for preventive services.     [name], NP   03/01/2022

## 2022-03-01 ENCOUNTER — Ambulatory Visit (INDEPENDENT_AMBULATORY_CARE_PROVIDER_SITE_OTHER): Payer: Medicare HMO | Admitting: Nurse Practitioner

## 2022-03-01 ENCOUNTER — Encounter: Payer: Self-pay | Admitting: Nurse Practitioner

## 2022-03-01 VITALS — BP 170/82 | HR 60 | Temp 97.3°F | Wt 211.4 lb

## 2022-03-01 DIAGNOSIS — Z136 Encounter for screening for cardiovascular disorders: Secondary | ICD-10-CM

## 2022-03-01 DIAGNOSIS — Z79899 Other long term (current) drug therapy: Secondary | ICD-10-CM

## 2022-03-01 DIAGNOSIS — R7309 Other abnormal glucose: Secondary | ICD-10-CM | POA: Diagnosis not present

## 2022-03-01 DIAGNOSIS — E559 Vitamin D deficiency, unspecified: Secondary | ICD-10-CM

## 2022-03-01 DIAGNOSIS — Z1389 Encounter for screening for other disorder: Secondary | ICD-10-CM

## 2022-03-01 DIAGNOSIS — Z0001 Encounter for general adult medical examination with abnormal findings: Secondary | ICD-10-CM | POA: Diagnosis not present

## 2022-03-01 DIAGNOSIS — Z0000 Encounter for general adult medical examination without abnormal findings: Secondary | ICD-10-CM

## 2022-03-01 DIAGNOSIS — E782 Mixed hyperlipidemia: Secondary | ICD-10-CM

## 2022-03-01 DIAGNOSIS — I10 Essential (primary) hypertension: Secondary | ICD-10-CM | POA: Diagnosis not present

## 2022-03-01 DIAGNOSIS — R6889 Other general symptoms and signs: Secondary | ICD-10-CM | POA: Diagnosis not present

## 2022-03-01 DIAGNOSIS — M25562 Pain in left knee: Secondary | ICD-10-CM

## 2022-03-01 DIAGNOSIS — G8929 Other chronic pain: Secondary | ICD-10-CM

## 2022-03-01 DIAGNOSIS — G4733 Obstructive sleep apnea (adult) (pediatric): Secondary | ICD-10-CM | POA: Diagnosis not present

## 2022-03-01 DIAGNOSIS — Z125 Encounter for screening for malignant neoplasm of prostate: Secondary | ICD-10-CM

## 2022-03-01 DIAGNOSIS — M25561 Pain in right knee: Secondary | ICD-10-CM

## 2022-03-01 DIAGNOSIS — H6191 Disorder of right external ear, unspecified: Secondary | ICD-10-CM | POA: Diagnosis not present

## 2022-03-01 DIAGNOSIS — K219 Gastro-esophageal reflux disease without esophagitis: Secondary | ICD-10-CM | POA: Diagnosis not present

## 2022-03-01 DIAGNOSIS — Z1329 Encounter for screening for other suspected endocrine disorder: Secondary | ICD-10-CM

## 2022-03-01 DIAGNOSIS — Z87891 Personal history of nicotine dependence: Secondary | ICD-10-CM | POA: Diagnosis not present

## 2022-03-01 DIAGNOSIS — I480 Paroxysmal atrial fibrillation: Secondary | ICD-10-CM

## 2022-03-01 NOTE — Patient Instructions (Signed)

## 2022-03-02 ENCOUNTER — Other Ambulatory Visit: Payer: Self-pay | Admitting: Internal Medicine

## 2022-03-02 LAB — CBC WITH DIFFERENTIAL/PLATELET
Absolute Monocytes: 663 cells/uL (ref 200–950)
Basophils Absolute: 42 cells/uL (ref 0–200)
Basophils Relative: 0.8 %
Eosinophils Absolute: 254 cells/uL (ref 15–500)
Eosinophils Relative: 4.8 %
HCT: 39.5 % (ref 38.5–50.0)
Hemoglobin: 13.7 g/dL (ref 13.2–17.1)
Lymphs Abs: 1171 cells/uL (ref 850–3900)
MCH: 32.9 pg (ref 27.0–33.0)
MCHC: 34.7 g/dL (ref 32.0–36.0)
MCV: 95 fL (ref 80.0–100.0)
MPV: 9.8 fL (ref 7.5–12.5)
Monocytes Relative: 12.5 %
Neutro Abs: 3169 cells/uL (ref 1500–7800)
Neutrophils Relative %: 59.8 %
Platelets: 228 10*3/uL (ref 140–400)
RBC: 4.16 10*6/uL — ABNORMAL LOW (ref 4.20–5.80)
RDW: 12.5 % (ref 11.0–15.0)
Total Lymphocyte: 22.1 %
WBC: 5.3 10*3/uL (ref 3.8–10.8)

## 2022-03-02 LAB — COMPLETE METABOLIC PANEL WITH GFR
AG Ratio: 1.9 (calc) (ref 1.0–2.5)
ALT: 69 U/L — ABNORMAL HIGH (ref 9–46)
AST: 39 U/L — ABNORMAL HIGH (ref 10–35)
Albumin: 4.4 g/dL (ref 3.6–5.1)
Alkaline phosphatase (APISO): 97 U/L (ref 35–144)
BUN: 22 mg/dL (ref 7–25)
CO2: 26 mmol/L (ref 20–32)
Calcium: 9.5 mg/dL (ref 8.6–10.3)
Chloride: 105 mmol/L (ref 98–110)
Creat: 1.11 mg/dL (ref 0.70–1.28)
Globulin: 2.3 g/dL (calc) (ref 1.9–3.7)
Glucose, Bld: 90 mg/dL (ref 65–99)
Potassium: 4.4 mmol/L (ref 3.5–5.3)
Sodium: 141 mmol/L (ref 135–146)
Total Bilirubin: 0.6 mg/dL (ref 0.2–1.2)
Total Protein: 6.7 g/dL (ref 6.1–8.1)
eGFR: 71 mL/min/{1.73_m2} (ref >=60)

## 2022-03-02 LAB — LIPID PANEL
Cholesterol: 167 mg/dL (ref <200)
HDL: 51 mg/dL (ref >=40)
LDL Cholesterol (Calc): 89 mg/dL (calc)
Non-HDL Cholesterol (Calc): 116 mg/dL (calc) (ref <130)
Total CHOL/HDL Ratio: 3.3 (calc) (ref <5.0)
Triglycerides: 172 mg/dL — ABNORMAL HIGH (ref <150)

## 2022-03-02 LAB — TSH: TSH: 3.34 mIU/L (ref 0.40–4.50)

## 2022-03-07 ENCOUNTER — Other Ambulatory Visit: Payer: Self-pay | Admitting: Internal Medicine

## 2022-03-13 DIAGNOSIS — G4733 Obstructive sleep apnea (adult) (pediatric): Secondary | ICD-10-CM | POA: Diagnosis not present

## 2022-03-27 DIAGNOSIS — M1711 Unilateral primary osteoarthritis, right knee: Secondary | ICD-10-CM | POA: Diagnosis not present

## 2022-03-28 ENCOUNTER — Telehealth: Payer: Self-pay

## 2022-03-28 NOTE — Telephone Encounter (Signed)
   Pre-operative Risk Assessment    Patient Name: Jason Dennis  DOB: [birth year] MRN: [id number]      Request for Surgical Clearance    Procedure:   Right knee arthroplasty   Date of Surgery:  Clearance TBD                                 Surgeon:  Dr. Frederik Pear  Surgeon's Group or Practice Name:  [facility]  Phone number:  [phone] Fax number:  [phone]   Type of Clearance Requested:   - Medical  - Pharmacy:  Hold Apixaban (Eliquis)     Type of Anesthesia:  Spinal   Additional requests/questions:    [name]   03/28/2022, 3:53 PM

## 2022-03-30 NOTE — Telephone Encounter (Signed)
   Patient Name: Jason Dennis  DOB: 1950/08/17 MRN: [id number]  Primary Cardiologist: [name], MD  Chart reviewed as part of pre-operative protocol coverage. Pre-op clearance already addressed by colleagues in earlier phone notes. To summarize recommendations:  -Per office protocol, patient can hold Eliquis for 3 days prior to procedure.   Patient will not need bridging with Lovenox (enoxaparin) around procedure.    He can proceed with knee surgery from a cardiology perspective. He is acceptable cardiac risk.  -Dr. Phineas Inches  Will route this bundled recommendation to requesting provider via Epic fax function and remove from pre-op pool. Please call with questions.  Elgie Collard, PA-C 03/30/2022, 4:26 PM

## 2022-03-30 NOTE — Telephone Encounter (Signed)
Patient with diagnosis of PAF(paroxysmal atrial fibrillation) on Eliquis for anticoagulation.    Procedure:  Right knee arthroplasty   Date of procedure: TBD   CHA2DS2-VASc Score = 2   This indicates a 2.2% annual risk of stroke. The patient's score is based upon: CHF History: 0 HTN History: 1 Diabetes History: 0 Stroke History: 0 Vascular Disease History: 0 Age Score: 1 Gender Score: 0      CrCl 71 ml/min (adj wt) (03/01/2022) Platelet count 228 (03/01/2022)   Per office protocol, patient can hold Eliquis for 3 days prior to procedure.   Patient will not need bridging with Lovenox (enoxaparin) around procedure.  **This guidance is not considered finalized until pre-operative APP has relayed final recommendations.**

## 2022-04-11 ENCOUNTER — Telehealth: Payer: Self-pay | Admitting: Internal Medicine

## 2022-04-11 NOTE — Telephone Encounter (Signed)
Spouse called to request a referral to sleep medicine.  She would like to get this done prior to upcoming hip surgery. Per Dr. [name], referral to sleep medicine dx osa on cpap.

## 2022-04-12 ENCOUNTER — Ambulatory Visit: Payer: Medicare HMO | Admitting: Internal Medicine

## 2022-04-12 DIAGNOSIS — M25661 Stiffness of right knee, not elsewhere classified: Secondary | ICD-10-CM | POA: Diagnosis not present

## 2022-04-12 DIAGNOSIS — R262 Difficulty in walking, not elsewhere classified: Secondary | ICD-10-CM | POA: Diagnosis not present

## 2022-04-12 DIAGNOSIS — M1731 Unilateral post-traumatic osteoarthritis, right knee: Secondary | ICD-10-CM | POA: Diagnosis not present

## 2022-04-13 DIAGNOSIS — R69 Illness, unspecified: Secondary | ICD-10-CM | POA: Diagnosis not present

## 2022-04-23 ENCOUNTER — Other Ambulatory Visit (HOSPITAL_COMMUNITY): Payer: Self-pay | Admitting: Internal Medicine

## 2022-04-23 DIAGNOSIS — I4819 Other persistent atrial fibrillation: Secondary | ICD-10-CM

## 2022-04-23 NOTE — Telephone Encounter (Signed)
Eliquis 31m refill request received. Patient is 72years old, weight-95.9kg, Crea-1.11 on 03/01/22, Diagnosis-Afib, and last seen by Dr. BHarl Bowieon 02/15/22. Dose is appropriate based on dosing criteria. Will send in refill to requested pharmacy.

## 2022-04-24 DIAGNOSIS — R69 Illness, unspecified: Secondary | ICD-10-CM | POA: Diagnosis not present

## 2022-04-25 DIAGNOSIS — M1711 Unilateral primary osteoarthritis, right knee: Secondary | ICD-10-CM | POA: Diagnosis not present

## 2022-05-04 DIAGNOSIS — M1711 Unilateral primary osteoarthritis, right knee: Secondary | ICD-10-CM | POA: Diagnosis not present

## 2022-05-04 DIAGNOSIS — M21161 Varus deformity, not elsewhere classified, right knee: Secondary | ICD-10-CM | POA: Diagnosis not present

## 2022-05-04 DIAGNOSIS — G8918 Other acute postprocedural pain: Secondary | ICD-10-CM | POA: Diagnosis not present

## 2022-05-04 DIAGNOSIS — Z96651 Presence of right artificial knee joint: Secondary | ICD-10-CM | POA: Diagnosis not present

## 2022-05-07 DIAGNOSIS — Z96651 Presence of right artificial knee joint: Secondary | ICD-10-CM | POA: Diagnosis not present

## 2022-05-07 DIAGNOSIS — M6281 Muscle weakness (generalized): Secondary | ICD-10-CM | POA: Diagnosis not present

## 2022-05-07 DIAGNOSIS — M25661 Stiffness of right knee, not elsewhere classified: Secondary | ICD-10-CM | POA: Diagnosis not present

## 2022-05-11 DIAGNOSIS — M6281 Muscle weakness (generalized): Secondary | ICD-10-CM | POA: Diagnosis not present

## 2022-05-11 DIAGNOSIS — M25661 Stiffness of right knee, not elsewhere classified: Secondary | ICD-10-CM | POA: Diagnosis not present

## 2022-05-11 DIAGNOSIS — Z96651 Presence of right artificial knee joint: Secondary | ICD-10-CM | POA: Diagnosis not present

## 2022-05-13 ENCOUNTER — Other Ambulatory Visit: Payer: Self-pay | Admitting: Nurse Practitioner

## 2022-05-13 DIAGNOSIS — E782 Mixed hyperlipidemia: Secondary | ICD-10-CM

## 2022-05-15 DIAGNOSIS — M25661 Stiffness of right knee, not elsewhere classified: Secondary | ICD-10-CM | POA: Diagnosis not present

## 2022-05-16 DIAGNOSIS — M6281 Muscle weakness (generalized): Secondary | ICD-10-CM | POA: Diagnosis not present

## 2022-05-16 DIAGNOSIS — Z96651 Presence of right artificial knee joint: Secondary | ICD-10-CM | POA: Diagnosis not present

## 2022-05-16 DIAGNOSIS — M25661 Stiffness of right knee, not elsewhere classified: Secondary | ICD-10-CM | POA: Diagnosis not present

## 2022-05-18 DIAGNOSIS — Z96651 Presence of right artificial knee joint: Secondary | ICD-10-CM | POA: Diagnosis not present

## 2022-05-18 DIAGNOSIS — M6281 Muscle weakness (generalized): Secondary | ICD-10-CM | POA: Diagnosis not present

## 2022-05-18 DIAGNOSIS — M25661 Stiffness of right knee, not elsewhere classified: Secondary | ICD-10-CM | POA: Diagnosis not present

## 2022-05-23 DIAGNOSIS — Z96651 Presence of right artificial knee joint: Secondary | ICD-10-CM | POA: Diagnosis not present

## 2022-05-23 DIAGNOSIS — M6281 Muscle weakness (generalized): Secondary | ICD-10-CM | POA: Diagnosis not present

## 2022-05-23 DIAGNOSIS — M25661 Stiffness of right knee, not elsewhere classified: Secondary | ICD-10-CM | POA: Diagnosis not present

## 2022-05-25 DIAGNOSIS — M6281 Muscle weakness (generalized): Secondary | ICD-10-CM | POA: Diagnosis not present

## 2022-05-25 DIAGNOSIS — Z96651 Presence of right artificial knee joint: Secondary | ICD-10-CM | POA: Diagnosis not present

## 2022-05-25 DIAGNOSIS — M25661 Stiffness of right knee, not elsewhere classified: Secondary | ICD-10-CM | POA: Diagnosis not present

## 2022-05-29 DIAGNOSIS — Z96651 Presence of right artificial knee joint: Secondary | ICD-10-CM | POA: Diagnosis not present

## 2022-05-29 DIAGNOSIS — M6281 Muscle weakness (generalized): Secondary | ICD-10-CM | POA: Diagnosis not present

## 2022-05-29 DIAGNOSIS — M25661 Stiffness of right knee, not elsewhere classified: Secondary | ICD-10-CM | POA: Diagnosis not present

## 2022-05-31 ENCOUNTER — Ambulatory Visit (INDEPENDENT_AMBULATORY_CARE_PROVIDER_SITE_OTHER): Payer: Medicare HMO | Admitting: Internal Medicine

## 2022-05-31 ENCOUNTER — Encounter: Payer: Self-pay | Admitting: Internal Medicine

## 2022-05-31 VITALS — BP 138/86 | HR 61 | Temp 97.9°F | Resp 17 | Ht 70.0 in | Wt 207.8 lb

## 2022-05-31 DIAGNOSIS — M6281 Muscle weakness (generalized): Secondary | ICD-10-CM | POA: Diagnosis not present

## 2022-05-31 DIAGNOSIS — Z79899 Other long term (current) drug therapy: Secondary | ICD-10-CM

## 2022-05-31 DIAGNOSIS — R7309 Other abnormal glucose: Secondary | ICD-10-CM | POA: Diagnosis not present

## 2022-05-31 DIAGNOSIS — Z96651 Presence of right artificial knee joint: Secondary | ICD-10-CM | POA: Diagnosis not present

## 2022-05-31 DIAGNOSIS — I480 Paroxysmal atrial fibrillation: Secondary | ICD-10-CM

## 2022-05-31 DIAGNOSIS — K219 Gastro-esophageal reflux disease without esophagitis: Secondary | ICD-10-CM | POA: Diagnosis not present

## 2022-05-31 DIAGNOSIS — I10 Essential (primary) hypertension: Secondary | ICD-10-CM | POA: Diagnosis not present

## 2022-05-31 DIAGNOSIS — E559 Vitamin D deficiency, unspecified: Secondary | ICD-10-CM

## 2022-05-31 DIAGNOSIS — M25661 Stiffness of right knee, not elsewhere classified: Secondary | ICD-10-CM | POA: Diagnosis not present

## 2022-05-31 DIAGNOSIS — E782 Mixed hyperlipidemia: Secondary | ICD-10-CM | POA: Diagnosis not present

## 2022-05-31 NOTE — Patient Instructions (Signed)

## 2022-05-31 NOTE — Progress Notes (Signed)
Future Appointments  Date Time Provider Department  05/31/2022                 6 mo ov  3:30 PM [name], MD [facility]  09/04/2022                   wellness  4:00 PM [name], NP [facility]  11/30/2022                  cpe 11:00 AM [name], MD [facility]    History of Present Illness:       This very nice 72 y.o.MWM presents for 3 month follow up with HTN, HLD, Pre-Diabetes and Vitamin D Deficiency.         Patient is treated for HTN  since  & BP has been controlled at home. Today's  . Patient has had no complaints of any cardiac type chest pain, palpitations, dyspnea / orthopnea / PND, dizziness, claudication, or dependent edema.        Hyperlipidemia is controlled with diet & meds. Patient denies myalgias or other med SE's. Last Lipids were  Lab Results  Component Value Date   CHOL 167 03/01/2022   HDL 51 03/01/2022   LDLCALC 89 03/01/2022   TRIG 172 (H) 03/01/2022   CHOLHDL 3.3 03/01/2022      Also, the patient has history of T2_NIDDM PreDiabetes and has had no symptoms of reactive hypoglycemia, diabetic polys, paresthesias or visual blurring.  Last A1c was   Lab Results  Component Value Date   HGBA1C 5.4 11/27/2021         Further, the patient also has history of Vitamin D Deficiency and supplements vitamin D . Last vitamin D was  Lab Results  Component Value Date   VD25OH 7 11/27/2021      Current Outpatient Medications on File Prior to Visit  Medication Sig   acetaminophen (TYLENOL) 500 MG tablet Take 1,000 mg by mouth in the morning and at bedtime.   amiodarone (PACERONE) 200 MG tablet Take 1 tablet (200 mg total) by mouth daily.   apixaban (ELIQUIS) 5 MG TABS tablet Take 1 tablet by mouth twice daily   atorvastatin (LIPITOR) 80 MG tablet TAKE 1 TABLET BY MOUTH ONCE DAILY FOR CHOLESTEROL   Cholecalciferol (VITAMIN D) 125 MCG (5000 UT) CAPS Take 5,000 Units by mouth daily.    cyclobenzaprine (FLEXERIL) 10 MG tablet TAKE  1/2 TO 1 (ONE-HALF TO ONE) TABLET BY MOUTH THREE TIMES DAILY AS NEEDED FOR MUSCLE SPASM   ibuprofen (ADVIL) 800 MG tablet TAKE 1 TABLET BY MOUTH TWICE DAILY WITH MEALS AS NEEDED FOR PAIN AND FOR INFLAMMATION   Liniments (DEEP BLUE RELIEF) GEL Apply 1 application. topically daily as needed (knee pain). (Patient not taking: Reported on 03/01/2022)   Menthol, Topical Analgesic, (BLUE-EMU MAXIMUM STRENGTH) 2.5 % LIQD Apply 1 application. topically daily as needed (knee pain). (Patient not taking: Reported on 03/01/2022)   metoprolol succinate (TOPROL-XL) 50 MG 24 hr tablet Take 1 tablet (50 mg total) by mouth daily. Take with or immediately following a meal.   Multiple Vitamins-Minerals (CENTRUM SILVER 50+MEN PO) Take 1 tablet by mouth daily.   Omega-3 Fatty Acids (FISH OIL) 1000 MG CAPS Take 1,000 mg by mouth daily.   omeprazole (PRILOSEC) 20 MG capsule Take 20 mg by mouth daily.   No current facility-administered medications on file prior to visit.      No Known Allergies    PMHx:  Past Medical History:  Diagnosis Date   GERD (gastroesophageal reflux disease)    Hyperlipidemia    Obesity    Prediabetes    Sleep apnea    CPAP   Vitamin D deficiency       Immunization History  Administered Date(s) Administered   Influenza Inj Mdck Quad With Preservative 02/18/2020   Influenza, High Dose Seasonal PF 10/21/2015, 12/24/2016, 01/10/2018, 11/20/2018, 12/22/2020, 12/14/2021   PPD Test 11/10/2013, 11/11/2014   Pneumococcal Conjugate-13 10/21/2015   Pneumococcal Polysaccharide-23 05/31/2016   Pneumococcal-Unspecified 04/15/2009   Td 11/17/2004   Tdap 11/11/2014   Zoster, Live 10/28/2012      Past Surgical History:  Procedure Laterality Date   CARDIOVERSION N/A 07/27/2021   Procedure: CARDIOVERSION;  Surgeon: Buford Dresser, MD;  Location: [facility];  Service: Cardiovascular;  Laterality: N/A;   COLONOSCOPY  05/02/2016   Dr.Jacobs   SPINE SURGERY     [phone]      FHx:    Reviewed / unchanged   SHx:    Reviewed / unchanged    Systems Review:  Constitutional: Denies fever, chills, wt changes, headaches, insomnia, fatigue, night sweats, change in appetite. Eyes: Denies redness, blurred vision, diplopia, discharge, itchy, watery eyes.  ENT: Denies discharge, congestion, post nasal drip, epistaxis, sore throat, earache, hearing loss, dental pain, tinnitus, vertigo, sinus pain, snoring.  CV: Denies chest pain, palpitations, irregular heartbeat, syncope, dyspnea, diaphoresis, orthopnea, PND, claudication or edema. Respiratory: denies cough, dyspnea, DOE, pleurisy, hoarseness, laryngitis, wheezing.  Gastrointestinal: Denies dysphagia, odynophagia, heartburn, reflux, water brash, abdominal pain or cramps, nausea, vomiting, bloating, diarrhea, constipation, hematemesis, melena, hematochezia  or hemorrhoids. Genitourinary: Denies dysuria, frequency, urgency, nocturia, hesitancy, discharge, hematuria or flank pain. Musculoskeletal: Denies arthralgias, myalgias, stiffness, jt. swelling, pain, limping or strain/sprain.  Skin: Denies pruritus, rash, hives, warts, acne, eczema or change in skin lesion(s). Neuro: No weakness, tremor, incoordination, spasms, paresthesia or pain. Psychiatric: Denies confusion, memory loss or sensory loss. Endo: Denies change in weight, skin or hair change.  Heme/Lymph: No excessive bleeding, bruising or enlarged lymph nodes.   Physical Exam  There were no vitals taken for this visit.  Appears  well nourished, well groomed  and in no distress.  Eyes: PERRLA, EOMs, conjunctiva no swelling or erythema. Sinuses: No frontal/maxillary tenderness ENT/Mouth: EAC's clear, TM's nl w/o erythema, bulging. Nares clear w/o erythema, swelling, exudates. Oropharynx clear without erythema or exudates. Oral hygiene is good. Tongue normal, non obstructing. Hearing intact.  Neck: Supple. Thyroid not palpable. Car 2+/2+ without bruits, nodes  or JVD. Chest: Respirations nl with BS clear & equal w/o rales, rhonchi, wheezing or stridor.  Cor: Heart sounds normal w/ regular rate and rhythm without sig. murmurs, gallops, clicks or rubs. Peripheral pulses normal and equal  without edema.  Abdomen: Soft & bowel sounds normal. Non-tender w/o guarding, rebound, hernias, masses or organomegaly.  Lymphatics: Unremarkable.  Musculoskeletal: Full ROM all peripheral extremities, joint stability, 5/5 strength and normal gait.  Skin: Warm, dry without exposed rashes, lesions or ecchymosis apparent.  Neuro: Cranial nerves intact, reflexes equal bilaterally. Sensory-motor testing grossly intact. Tendon reflexes grossly intact.  Pysch: Alert & oriented x 3.  Insight and judgement nl & appropriate. No ideations.   Assessment and Plan:  - Continue medication, monitor blood pressure at home.  - Continue DASH diet.  Reminder to go to the ER if any CP,  SOB, nausea, dizziness, severe HA, changes vision/speech.  - Continue diet/meds, exercise,& lifestyle modifications.  - Continue monitor periodic cholesterol/liver & renal  functions    - Continue diet, exercise  - Lifestyle modifications.  - Monitor appropriate labs. - Continue supplementation.        Discussed  regular exercise, BP monitoring, weight control to achieve/maintain BMI less than 25 and discussed med and SE's. Recommended labs to assess /monitor clinical status .  I discussed the assessment and treatment plan with the patient. The patient was provided an opportunity to ask questions and all were answered. The patient agreed with the plan and demonstrated an understanding of the instructions.  I provided over 30 minutes of exam, counseling, chart review and  complex critical decision making.        The patient was advised to call back or seek an in-person evaluation if the symptoms worsen or if the condition fails to improve as anticipated.   Kirtland Bouchard, MD

## 2022-06-01 DIAGNOSIS — E860 Dehydration: Secondary | ICD-10-CM | POA: Diagnosis not present

## 2022-06-01 LAB — LIPID PANEL
Cholesterol: 132 mg/dL (ref <200)
HDL: 40 mg/dL (ref >=40)
LDL Cholesterol (Calc): 61 mg/dL (calc)
Non-HDL Cholesterol (Calc): 92 mg/dL (calc) (ref <130)
Total CHOL/HDL Ratio: 3.3 (calc) (ref <5.0)
Triglycerides: 262 mg/dL — ABNORMAL HIGH (ref <150)

## 2022-06-01 LAB — CBC WITH DIFFERENTIAL/PLATELET
Absolute Monocytes: 559 cells/uL (ref 200–950)
Basophils Absolute: 39 cells/uL (ref 0–200)
Basophils Relative: 0.6 %
Eosinophils Absolute: 312 cells/uL (ref 15–500)
Eosinophils Relative: 4.8 %
HCT: 36.3 % — ABNORMAL LOW (ref 38.5–50.0)
Hemoglobin: 12 g/dL — ABNORMAL LOW (ref 13.2–17.1)
Lymphs Abs: 1086 cells/uL (ref 850–3900)
MCH: 30.6 pg (ref 27.0–33.0)
MCHC: 33.1 g/dL (ref 32.0–36.0)
MCV: 92.6 fL (ref 80.0–100.0)
MPV: 10 fL (ref 7.5–12.5)
Monocytes Relative: 8.6 %
Neutro Abs: 4505 cells/uL (ref 1500–7800)
Neutrophils Relative %: 69.3 %
Platelets: 311 10*3/uL (ref 140–400)
RBC: 3.92 10*6/uL — ABNORMAL LOW (ref 4.20–5.80)
RDW: 11.7 % (ref 11.0–15.0)
Total Lymphocyte: 16.7 %
WBC: 6.5 10*3/uL (ref 3.8–10.8)

## 2022-06-01 LAB — VITAMIN D 25 HYDROXY (VIT D DEFICIENCY, FRACTURES): Vit D, 25-Hydroxy: 52 ng/mL (ref 30–100)

## 2022-06-01 LAB — COMPLETE METABOLIC PANEL WITH GFR
AG Ratio: 2.2 (calc) (ref 1.0–2.5)
ALT: 30 U/L (ref 9–46)
AST: 17 U/L (ref 10–35)
Albumin: 4.4 g/dL (ref 3.6–5.1)
Alkaline phosphatase (APISO): 98 U/L (ref 35–144)
BUN/Creatinine Ratio: 15 (calc) (ref 6–22)
BUN: 20 mg/dL (ref 7–25)
CO2: 25 mmol/L (ref 20–32)
Calcium: 9.7 mg/dL (ref 8.6–10.3)
Chloride: 103 mmol/L (ref 98–110)
Creat: 1.32 mg/dL — ABNORMAL HIGH (ref 0.70–1.28)
Globulin: 2 g/dL (calc) (ref 1.9–3.7)
Glucose, Bld: 110 mg/dL — ABNORMAL HIGH (ref 65–99)
Potassium: 4.3 mmol/L (ref 3.5–5.3)
Sodium: 140 mmol/L (ref 135–146)
Total Bilirubin: 0.6 mg/dL (ref 0.2–1.2)
Total Protein: 6.4 g/dL (ref 6.1–8.1)
eGFR: 58 mL/min/{1.73_m2} — ABNORMAL LOW (ref >=60)

## 2022-06-01 LAB — MAGNESIUM: Magnesium: 1.7 mg/dL (ref 1.5–2.5)

## 2022-06-01 LAB — HEMOGLOBIN A1C
Hgb A1c MFr Bld: 5.5 % of total Hgb (ref <5.7)
Mean Plasma Glucose: 111 mg/dL
eAG (mmol/L): 6.2 mmol/L

## 2022-06-01 LAB — TSH: TSH: 3.89 mIU/L (ref 0.40–4.50)

## 2022-06-01 LAB — INSULIN, RANDOM: Insulin: 27.9 u[IU]/mL — ABNORMAL HIGH

## 2022-06-03 NOTE — Progress Notes (Signed)
<><><><><><><><><><><><><><><><><><><><><><><><><><><><><><><><><> <><><><><><><><><><><><><><><><><><><><><><><><><><><><><><><><><> - Test results slightly outside the reference range are not unusual. If there is anything important, I will review this with you,  otherwise it is considered normal test values.  If you have further questions,  please do not hesitate to contact me at the office or via My Chart.  <><><><><><><><><><><><><><><><><><><><><><><><><><><><><><><><><> <><><><><><><><><><><><><><><><><><><><><><><><><><><><><><><><><>  -  Kidney function is decreased and suggests that you are not drinking enough fluids    - Very important to drink adequate amounts of fluids to prevent permanent damage    - Recommend drink at least 6 bottles (16 ounces) of fluids /water /day = 96 Oz ~100 oz  - 100 oz = 3,000 cc or 3 liters / day  - >> That's 1 &1/2 bottles of a 2 liter soda bottle /day !  <><><><><><><><><><><><><><><><><><><><><><><><><><><><><><><><><> <><><><><><><><><><><><><><><><><><><><><><><><><><><><><><><><><>  -  Chol = 132   &   LDL =  61   - both  Excellent   - Very low risk for Heart Attack  / Stroke <><><><><><><><><><><><><><><><><><><><><><><><><><><><><><><><><>  -  But Triglycerides ( = 262 ) or fats in blood are too high                 (   Ideal or  Goal is less than 150  !  )    - Recommend avoid fried & greasy foods,  sweets / candy,   - Avoid white rice  (brown or wild rice or Quinoa is OK),   - Avoid white potatoes  (sweet potatoes are OK)   - Avoid anything made from white flour  - bagels, doughnuts, rolls, buns, biscuits, white and   wheat breads, pizza crust and traditional  pasta made of white flour & egg white  - (vegetarian pasta or spinach or wheat pasta is OK).    - Multi-grain bread is OK - like multi-grain flat bread or  sandwich thins.   - Avoid alcohol in excess.   - Exercise is also  important. <><><><><><><><><><><><><><><><><><><><><><><><><><><><><><><><><> <><><><><><><><><><><><><><><><><><><><><><><><><><><><><><><><><>  -   Magnesium  =     1.7    -  very  low   - goal is betw 2.0 - 2.5,   - So..........Marland Kitchen  Recommend that you take Magnesium 500 mg tablet 3 x /day with meals   - also important to eat lots of  leafy green vegetables   - spinach - Kale - collards - greens - okra - asparagus - broccoli - quinoa - squash - almonds   - black, red, white beans  -  peas - green beans <><><><><><><><><><><><><><><><><><><><><><><><><><><><><><><><><>  -  A1c - Normal - No Diabetes  - Great ! <><><><><><><><><><><><><><><><><><><><><><><><><><><><><><><><><>  -  Vitamin D = 52  - is STILL Low    Vitamin D goal is between 70-100.   - Please INCREASE  your Vitamin D to 10,000 units /day   - It is very important as a natural anti-inflammatory and helping the                                   immune system protect against viral infections, like the Covid-19    helping hair, skin, and nails, as well as reducing stroke and heart attack risk.    - It helps your bones and helps with mood.  - It also decreases numerous cancer risks so please  -    take it as directed.     - Low Vit D is associated  with a 200-300% higher risk for CANCER                                         and 200-300% higher risk for [place].      - It is also associated with higher death rate at younger ages,   autoimmune diseases like Rheumatoid arthritis, Lupus, Multiple Sclerosis.      - Also many other serious conditions, like depression, Alzheimer's  Dementia,   -  muscle aches, fatigue, fibromyalgia  <><><><><><><><><><><><><><><><><><><><><><><><><><><><><><><><><>  -  All Else - CBC - Electrolytes - Liver - Magnesium & Thyroid    - all  Normal /  OK <><><><><><><><><><><><><><><><><><><><><><><><><><><><><><><><><> <><><><><><><><><><><><><><><><><><><><><><><><><><><><><><><><><>

## 2022-06-04 DIAGNOSIS — Z96651 Presence of right artificial knee joint: Secondary | ICD-10-CM | POA: Diagnosis not present

## 2022-06-04 DIAGNOSIS — M6281 Muscle weakness (generalized): Secondary | ICD-10-CM | POA: Diagnosis not present

## 2022-06-04 DIAGNOSIS — M25661 Stiffness of right knee, not elsewhere classified: Secondary | ICD-10-CM | POA: Diagnosis not present

## 2022-06-05 DIAGNOSIS — M25661 Stiffness of right knee, not elsewhere classified: Secondary | ICD-10-CM | POA: Diagnosis not present

## 2022-06-06 DIAGNOSIS — Z96651 Presence of right artificial knee joint: Secondary | ICD-10-CM | POA: Diagnosis not present

## 2022-06-06 DIAGNOSIS — M6281 Muscle weakness (generalized): Secondary | ICD-10-CM | POA: Diagnosis not present

## 2022-06-06 DIAGNOSIS — M25661 Stiffness of right knee, not elsewhere classified: Secondary | ICD-10-CM | POA: Diagnosis not present

## 2022-06-12 DIAGNOSIS — Z96651 Presence of right artificial knee joint: Secondary | ICD-10-CM | POA: Diagnosis not present

## 2022-06-12 DIAGNOSIS — M25661 Stiffness of right knee, not elsewhere classified: Secondary | ICD-10-CM | POA: Diagnosis not present

## 2022-06-12 DIAGNOSIS — G4733 Obstructive sleep apnea (adult) (pediatric): Secondary | ICD-10-CM | POA: Diagnosis not present

## 2022-06-12 DIAGNOSIS — M6281 Muscle weakness (generalized): Secondary | ICD-10-CM | POA: Diagnosis not present

## 2022-06-15 DIAGNOSIS — Z96651 Presence of right artificial knee joint: Secondary | ICD-10-CM | POA: Diagnosis not present

## 2022-06-15 DIAGNOSIS — M6281 Muscle weakness (generalized): Secondary | ICD-10-CM | POA: Diagnosis not present

## 2022-06-15 DIAGNOSIS — M25661 Stiffness of right knee, not elsewhere classified: Secondary | ICD-10-CM | POA: Diagnosis not present

## 2022-06-17 ENCOUNTER — Other Ambulatory Visit: Payer: Self-pay | Admitting: Nurse Practitioner

## 2022-06-17 DIAGNOSIS — E782 Mixed hyperlipidemia: Secondary | ICD-10-CM

## 2022-06-18 DIAGNOSIS — Z96651 Presence of right artificial knee joint: Secondary | ICD-10-CM | POA: Diagnosis not present

## 2022-06-18 DIAGNOSIS — M6281 Muscle weakness (generalized): Secondary | ICD-10-CM | POA: Diagnosis not present

## 2022-06-18 DIAGNOSIS — M25661 Stiffness of right knee, not elsewhere classified: Secondary | ICD-10-CM | POA: Diagnosis not present

## 2022-06-21 DIAGNOSIS — M6281 Muscle weakness (generalized): Secondary | ICD-10-CM | POA: Diagnosis not present

## 2022-06-21 DIAGNOSIS — M25661 Stiffness of right knee, not elsewhere classified: Secondary | ICD-10-CM | POA: Diagnosis not present

## 2022-06-21 DIAGNOSIS — Z96651 Presence of right artificial knee joint: Secondary | ICD-10-CM | POA: Diagnosis not present

## 2022-06-25 DIAGNOSIS — M6281 Muscle weakness (generalized): Secondary | ICD-10-CM | POA: Diagnosis not present

## 2022-06-25 DIAGNOSIS — Z96651 Presence of right artificial knee joint: Secondary | ICD-10-CM | POA: Diagnosis not present

## 2022-06-25 DIAGNOSIS — M25661 Stiffness of right knee, not elsewhere classified: Secondary | ICD-10-CM | POA: Diagnosis not present

## 2022-06-27 DIAGNOSIS — Z96651 Presence of right artificial knee joint: Secondary | ICD-10-CM | POA: Diagnosis not present

## 2022-06-27 DIAGNOSIS — M25661 Stiffness of right knee, not elsewhere classified: Secondary | ICD-10-CM | POA: Diagnosis not present

## 2022-06-27 DIAGNOSIS — M6281 Muscle weakness (generalized): Secondary | ICD-10-CM | POA: Diagnosis not present

## 2022-06-27 DIAGNOSIS — Z96661 Presence of right artificial ankle joint: Secondary | ICD-10-CM | POA: Diagnosis not present

## 2022-06-28 ENCOUNTER — Telehealth: Payer: Self-pay | Admitting: Internal Medicine

## 2022-06-28 NOTE — Telephone Encounter (Signed)
Patient has been have diarrhea and vomiting starting today.  Patient's wife thinks he has "the stomach virus".   She called PCP and they wanted to see him but he feels he can't get out of bed today w all the diarrhea and vomiting.  She is going to give him OTC anti diarrheal medication loperamide. He is not on diuretics.  She states for the past about 2 weeks he has been noticing fatigue "no get up and go".  He wants to know if Dr. Wyline Mood would want to see him.  His labs were checked by PCP at the end of March.  No BP or HR readings obtained.  Denies SOB or chest pain.    Adv will route to Dr. Wyline Mood and call back if any new recommendations.

## 2022-06-28 NOTE — Telephone Encounter (Signed)
Loperamide is QTc prolonging and so is his amiodarone. EKG stable from last November with normal QTc, ok to use loperamide as needed short term.

## 2022-06-28 NOTE — Telephone Encounter (Signed)
Patient's wife states the patient has been very tired and low on energy recently. She states today he also has diarrhea and vomiting, and would like to know if Dr. Wyline Mood can prescribe a medication for these symptoms.

## 2022-06-29 NOTE — Telephone Encounter (Signed)
Left detailed message on Jason Dennis's VM ([place]) ok to [place] loperamide for short term due to being on amiodarone.  Adv to call back with any other concerns.

## 2022-07-12 DIAGNOSIS — G4733 Obstructive sleep apnea (adult) (pediatric): Secondary | ICD-10-CM | POA: Diagnosis not present

## 2022-07-23 DIAGNOSIS — H25813 Combined forms of age-related cataract, bilateral: Secondary | ICD-10-CM | POA: Diagnosis not present

## 2022-07-23 DIAGNOSIS — H52203 Unspecified astigmatism, bilateral: Secondary | ICD-10-CM | POA: Diagnosis not present

## 2022-07-23 DIAGNOSIS — H1789 Other corneal scars and opacities: Secondary | ICD-10-CM | POA: Diagnosis not present

## 2022-07-23 DIAGNOSIS — H31003 Unspecified chorioretinal scars, bilateral: Secondary | ICD-10-CM | POA: Diagnosis not present

## 2022-07-23 DIAGNOSIS — H4720 Unspecified optic atrophy: Secondary | ICD-10-CM | POA: Diagnosis not present

## 2022-08-12 DIAGNOSIS — G4733 Obstructive sleep apnea (adult) (pediatric): Secondary | ICD-10-CM | POA: Diagnosis not present

## 2022-09-03 NOTE — Progress Notes (Unsigned)
ANNUAL MEDICARE WELLNESS VISIT AND FOLLOW UP Assessment:   Encounter for Medicare annual wellness exam 1 year  Essential hypertension - continue medications, DASH diet, exercise and monitor at home. Call if greater than 130/80.  -     CBC with Differential/Platelet -     CMP/GFR -     TSH  Paroxysmal Atrial fibrillation Follows with Dr. Wyline Mood Was in rhythm at 02/15/22 visit with Dr. Wyline Mood Continue Amiodarone, Metoprolol and Eliquis  OSA on CPAP Weight loss advised, continue CPAP  Mixed hyperlipidemia -continue medications, check lipids, decrease fatty foods, increase activity.  -     Lipid panel  Abnormal glucose Discussed general issues about diabetes pathophysiology and management., Educational material distributed., Suggested low cholesterol diet., Encouraged aerobic exercise., Discussed foot care., Reminded to get yearly retinal exam.  Medication management -     TSH  CKD stage 3a(HCC) Increase fluids, avoid NSAIDS, monitor sugars, will monitor   Gastroesophageal reflux disease, esophagitis presence not specified Continue PPI/H2 blocker, diet discussed  Vitamin D deficiency Continue supplement  Overweight Long discussion about weight loss, diet, and exercise Recommended diet heavy in fruits and veggies and low in animal meats, cheeses, and dairy products, appropriate calorie intake Patient will work on decreasing saturated fats and simple carbs, increase activity Follow up at next visit   S/P Right knee replacement Completed PT for right knee after replacement Continue to follow with orthopedics.   Over 30 minutes of exam, counseling, chart review, and critical decision making was performed  Future Appointments  Date Time Provider Department Center  12/31/2022 10:00 AM Lucky Cowboy, MD [facility] None  09/05/2023  3:30 PM Raynelle Dick, NP [facility] None     Plan:   During the course of the visit the patient was educated and counseled about  appropriate screening and preventive services including:   Pneumococcal vaccine  Influenza vaccine Prevnar 13 Td vaccine Screening electrocardiogram Colorectal cancer screening Diabetes screening Glaucoma screening Nutrition counseling    Subjective:  Jason Dennis is a 72 y.o. male who presents for Medicare Annual Wellness Visit and 3 month follow up for Paroxysmal Atrial Fibrillation, HTN, hyperlipidemia, glucose, and vitamin D Def.  He had right knee replacement 05/04/22. He had physical therapy for his right knee and was released 06/2022. Still has some pain in left knee but states it is not that bad, does not want intervention at this time.  BMI is Body mass index is 29.62 kg/m., he is not currently working on diet and exercise.  He is on CPAP for OSA- has been getting new supplies. . He stays busy all day Wt Readings from Last 3 Encounters:  09/04/22 206 lb 6.4 oz (93.6 kg)  05/31/22 207 lb 12.8 oz (94.3 kg)  03/01/22 211 lb 6.4 oz (95.9 kg)    He is being followed by Dr. Wyline Mood for paroxysmal atrial fibrillation that was discovered at colonoscopy. Plans to get colonoscopy next year. Had cardioversion 07/27/21. Is currently in sinus rhythm on amiodarone and metoprolol. Takes eliquis for anticoagulation.  He gets fatigued easier. His blood pressure has been controlled at home normally running 130/70's, currently on metoprolol 50 mg QD and amiodarone 200 mg QD, today their BP is BP: 122/70  BP Readings from Last 3 Encounters:  09/04/22 122/70  05/31/22 138/86  03/01/22 (!) 170/82   He does workout. He denies chest pain, shortness of breath, dizziness.   He is on cholesterol medication, Atorvastatin 80 mg QD,  and denies myalgias. His cholesterol is  at goal. The cholesterol last visit was:   Lab Results  Component Value Date   CHOL 132 05/31/2022   HDL 40 05/31/2022   LDLCALC 61 05/31/2022   TRIG 262 (H) 05/31/2022   CHOLHDL 3.3 05/31/2022   He does have diet controlled  prediabetes.  He reports that he is doing well with diet and exercise.   Lab Results  Component Value Date   HGBA1C 5.5 05/31/2022    He has not been drinking as much water. Last GFR Lab Results  Component Value Date   EGFR 58 (L) 05/31/2022     Patient is on Vitamin D supplement.   Lab Results  Component Value Date   VD25OH 52 05/31/2022       Medication Review: Current Outpatient Medications on File Prior to Visit  Medication Sig Dispense Refill   acetaminophen (TYLENOL) 500 MG tablet Take 1,000 mg by mouth in the morning and at bedtime.     amiodarone (PACERONE) 200 MG tablet Take 1 tablet (200 mg total) by mouth daily. 90 tablet 3   apixaban (ELIQUIS) 5 MG TABS tablet Take 1 tablet by mouth twice daily 60 tablet 5   atorvastatin (LIPITOR) 80 MG tablet TAKE 1 TABLET BY MOUTH ONCE DAILY FOR CHOLESTEROL 90 tablet 0   Cholecalciferol (VITAMIN D) 125 MCG (5000 UT) CAPS Take 5,000 Units by mouth daily.      cyclobenzaprine (FLEXERIL) 10 MG tablet TAKE 1/2 TO 1 (ONE-HALF TO ONE) TABLET BY MOUTH THREE TIMES DAILY AS NEEDED FOR MUSCLE SPASM 90 tablet 0   metoprolol succinate (TOPROL-XL) 50 MG 24 hr tablet Take 1 tablet (50 mg total) by mouth daily. Take with or immediately following a meal. 90 tablet 3   Multiple Vitamins-Minerals (CENTRUM SILVER 50+MEN PO) Take 1 tablet by mouth daily.     Omega-3 Fatty Acids (FISH OIL) 1000 MG CAPS Take 1,000 mg by mouth daily.     omeprazole (PRILOSEC) 20 MG capsule Take 20 mg by mouth daily.     ibuprofen (ADVIL) 800 MG tablet TAKE 1 TABLET BY MOUTH TWICE DAILY WITH MEALS AS NEEDED FOR PAIN AND FOR INFLAMMATION (Patient not taking: Reported on 05/31/2022) 180 tablet 0   Liniments (DEEP BLUE RELIEF) GEL Apply 1 application. topically daily as needed (knee pain). (Patient not taking: Reported on 03/01/2022)     Menthol, Topical Analgesic, (BLUE-EMU MAXIMUM STRENGTH) 2.5 % LIQD Apply 1 application. topically daily as needed (knee pain). (Patient not  taking: Reported on 03/01/2022)     No current facility-administered medications on file prior to visit.    Allergies: No Known Allergies  Current Problems (verified) has Hyperlipidemia, mixed; Hypertension; Abnormal glucose; Vitamin D deficiency; Overweight (BMI 25.0-29.9); OSA on CPAP; GERD (gastroesophageal reflux disease); Medication management; Tear of left supraspinatus tendon; Impingement syndrome of left shoulder; Tendinopathy of left biceps; FHx: heart disease; Former smoker (32 pack year, quit 1991); Morbid obesity (HCC); Persistent atrial fibrillation (HCC); and Secondary hypercoagulable state (HCC) on their problem list.  Screening Tests Immunization History  Administered Date(s) Administered   Influenza Inj Mdck Quad With Preservative 02/18/2020   Influenza, High Dose Seasonal PF 10/21/2015, 12/24/2016, 01/10/2018, 11/20/2018, 12/22/2020, 12/14/2021   PPD Test 11/10/2013, 11/11/2014   Pneumococcal Conjugate-13 10/21/2015   Pneumococcal Polysaccharide-23 05/31/2016   Pneumococcal-Unspecified 04/15/2009   Td 11/17/2004   Tdap 11/11/2014   Zoster, Live 10/28/2012   Health Maintenance  Topic Date Due   Zoster Vaccines- Shingrix (1 of 2) 12/05/2022 (Originally 09/27/1969)   Colonoscopy  03/02/2023 (Originally 05/02/2021)   INFLUENZA VACCINE  10/04/2022   Medicare Annual Wellness (AWV)  09/04/2023   DTaP/Tdap/Td (3 - Td or Tdap) 11/10/2024   Pneumonia Vaccine 84+ Years old  Completed   Hepatitis C Screening  Completed   HPV VACCINES  Aged Out   COVID-19 Vaccine  Discontinued     Names of Other Physician/Practitioners you currently use: 1. [facility] here for primary care 2. Dr. Emily Filbert, eye doctor, last visit 07/23/2022 3. Dr. Mayford Knife, dentist, last visit 12/2021  Patient Care Team: Lucky Cowboy, MD as PCP - General Wyline Mood Alben Spittle, MD as PCP - Cardiology (Cardiology)  Surgical: He  has a past surgical history that includes  Spine surgery; Colonoscopy (05/02/2016); and Cardioversion (N/A, 07/27/2021). Family His family history includes COPD in his mother; Colon cancer in his father; Diabetes in his brother; Heart attack in his father; Heart disease in his mother; Heart failure in his father and mother; Hypertension in his mother. Social history  He reports that he quit smoking about 32 years ago. His smoking use included cigarettes. He has a 32.00 pack-year smoking history. He has never used smokeless tobacco. He reports current alcohol use of about 14.0 standard drinks of alcohol per week. He reports that he does not use drugs.  MEDICARE WELLNESS OBJECTIVES: Physical activity:  busy around the house, no formal exercise Cardiac risk factors: Cardiac Risk Factors include: dyslipidemia;hypertension;advanced age (>62men, >90 women);sedentary lifestyle Depression/mood screen:      09/04/2022    4:21 PM  Depression screen PHQ 2/9  Decreased Interest 0  Down, Depressed, Hopeless 0  PHQ - 2 Score 0    ADLs:     09/04/2022    4:20 PM 03/01/2022    3:39 PM  In your present state of health, do you have any difficulty performing the following activities:  Hearing? 0 0  Vision? 0 0  Difficulty concentrating or making decisions? 0 0  Walking or climbing stairs? 1 0  Dressing or bathing? 0 0  Doing errands, shopping? 0 0    Cognitive Testing  Alert? Yes  Normal Appearance?Yes  Oriented to person? Yes  Place? Yes   Time? Yes  Recall of three objects?  Yes  Can perform simple calculations? Yes  Displays appropriate judgment?Yes  Can read the correct time from a watch face?Yes  EOL planning: Does Patient Have a Medical Advance Directive?: Yes Type of Advance Directive: Living will, Healthcare Power of Attorney Does patient want to make changes to medical advance directive?: No - Patient declined Copy of Healthcare Power of Attorney in Chart?: No - copy requested   Objective:   Today's Vitals   09/04/22 1603   BP: 122/70  Pulse: 62  Temp: 97.7 F (36.5 C)  SpO2: 96%  Weight: 206 lb 6.4 oz (93.6 kg)  Height: 5\' 10"  (1.778 m)      Body mass index is 29.62 kg/m.  General appearance: alert, no distress, WD/WN, male HEENT: normocephalic, sclerae anicteric, TMs pearly, nares patent, no discharge or erythema, pharynx normal Oral cavity: MMM, no lesions Neck: supple, no lymphadenopathy, no thyromegaly, no masses Heart: RRR, normal S1, S2, no murmurs Lungs: CTA bilaterally, no wheezes, rhonchi, or rales Abdomen: +bs, soft, non tender, non distended, no masses, no hepatomegaly, no splenomegaly Musculoskeletal: nontender, no swelling, no obvious deformity Extremities: no edema, no cyanosis, no clubbing Pulses: 2+ symmetric, upper and lower extremities, normal cap refill Neurological: alert, oriented x 3, CN2-12 intact, strength normal  upper extremities and lower extremities, sensation normal throughout, DTRs 2+ throughout, no cerebellar signs, gait normal Skin: warm and dry, multiple skin tears on arms and ecchymoses Psychiatric: normal affect, behavior normal, pleasant   Medicare Attestation I have personally reviewed: The patient's medical and social history Their use of alcohol, tobacco or illicit drugs Their current medications and supplements The patient's functional ability including ADLs,fall risks, home safety risks, cognitive, and hearing and visual impairment Diet and physical activities Evidence for depression or mood disorders  The patient's weight, height, BMI, and visual acuity have been recorded in the chart.  I have made referrals, counseling, and provided education to the patient based on review of the above and I have provided the patient with a written personalized care plan for preventive services.     Raynelle Dick, NP   09/04/2022

## 2022-09-04 ENCOUNTER — Encounter: Payer: Self-pay | Admitting: Nurse Practitioner

## 2022-09-04 ENCOUNTER — Ambulatory Visit (INDEPENDENT_AMBULATORY_CARE_PROVIDER_SITE_OTHER): Payer: Medicare HMO | Admitting: Nurse Practitioner

## 2022-09-04 VITALS — BP 122/70 | HR 62 | Temp 97.7°F | Ht 70.0 in | Wt 206.4 lb

## 2022-09-04 DIAGNOSIS — E559 Vitamin D deficiency, unspecified: Secondary | ICD-10-CM | POA: Diagnosis not present

## 2022-09-04 DIAGNOSIS — E663 Overweight: Secondary | ICD-10-CM | POA: Diagnosis not present

## 2022-09-04 DIAGNOSIS — D6869 Other thrombophilia: Secondary | ICD-10-CM

## 2022-09-04 DIAGNOSIS — R6889 Other general symptoms and signs: Secondary | ICD-10-CM | POA: Diagnosis not present

## 2022-09-04 DIAGNOSIS — G4733 Obstructive sleep apnea (adult) (pediatric): Secondary | ICD-10-CM

## 2022-09-04 DIAGNOSIS — N1831 Chronic kidney disease, stage 3a: Secondary | ICD-10-CM

## 2022-09-04 DIAGNOSIS — Z0000 Encounter for general adult medical examination without abnormal findings: Secondary | ICD-10-CM

## 2022-09-04 DIAGNOSIS — Z87891 Personal history of nicotine dependence: Secondary | ICD-10-CM

## 2022-09-04 DIAGNOSIS — R7309 Other abnormal glucose: Secondary | ICD-10-CM | POA: Diagnosis not present

## 2022-09-04 DIAGNOSIS — Z0001 Encounter for general adult medical examination with abnormal findings: Secondary | ICD-10-CM | POA: Diagnosis not present

## 2022-09-04 DIAGNOSIS — I10 Essential (primary) hypertension: Secondary | ICD-10-CM | POA: Diagnosis not present

## 2022-09-04 DIAGNOSIS — Z79899 Other long term (current) drug therapy: Secondary | ICD-10-CM

## 2022-09-04 DIAGNOSIS — E782 Mixed hyperlipidemia: Secondary | ICD-10-CM

## 2022-09-04 DIAGNOSIS — I480 Paroxysmal atrial fibrillation: Secondary | ICD-10-CM

## 2022-09-04 DIAGNOSIS — Z96651 Presence of right artificial knee joint: Secondary | ICD-10-CM

## 2022-09-04 DIAGNOSIS — K219 Gastro-esophageal reflux disease without esophagitis: Secondary | ICD-10-CM

## 2022-09-04 DIAGNOSIS — G8929 Other chronic pain: Secondary | ICD-10-CM

## 2022-09-04 NOTE — Patient Instructions (Signed)

## 2022-09-05 LAB — COMPLETE METABOLIC PANEL WITH GFR
AG Ratio: 2.3 (calc) (ref 1.0–2.5)
ALT: 29 U/L (ref 9–46)
AST: 22 U/L (ref 10–35)
Albumin: 4.8 g/dL (ref 3.6–5.1)
Alkaline phosphatase (APISO): 94 U/L (ref 35–144)
BUN: 23 mg/dL (ref 7–25)
CO2: 27 mmol/L (ref 20–32)
Calcium: 9.9 mg/dL (ref 8.6–10.3)
Chloride: 106 mmol/L (ref 98–110)
Creat: 1.1 mg/dL (ref 0.70–1.28)
Globulin: 2.1 g/dL (calc) (ref 1.9–3.7)
Glucose, Bld: 99 mg/dL (ref 65–99)
Potassium: 4.7 mmol/L (ref 3.5–5.3)
Sodium: 141 mmol/L (ref 135–146)
Total Bilirubin: 0.7 mg/dL (ref 0.2–1.2)
Total Protein: 6.9 g/dL (ref 6.1–8.1)
eGFR: 72 mL/min/{1.73_m2} (ref >=60)

## 2022-09-05 LAB — LIPID PANEL
Cholesterol: 166 mg/dL (ref <200)
HDL: 61 mg/dL (ref >=40)
LDL Cholesterol (Calc): 81 mg/dL (calc)
Non-HDL Cholesterol (Calc): 105 mg/dL (calc) (ref <130)
Total CHOL/HDL Ratio: 2.7 (calc) (ref <5.0)
Triglycerides: 143 mg/dL (ref <150)

## 2022-09-05 LAB — CBC WITH DIFFERENTIAL/PLATELET
Absolute Monocytes: 676 cells/uL (ref 200–950)
Basophils Absolute: 37 cells/uL (ref 0–200)
Basophils Relative: 0.6 %
Eosinophils Absolute: 229 cells/uL (ref 15–500)
Eosinophils Relative: 3.7 %
HCT: 42.4 % (ref 38.5–50.0)
Hemoglobin: 13.7 g/dL (ref 13.2–17.1)
Lymphs Abs: 1184 cells/uL (ref 850–3900)
MCH: 29.9 pg (ref 27.0–33.0)
MCHC: 32.3 g/dL (ref 32.0–36.0)
MCV: 92.6 fL (ref 80.0–100.0)
MPV: 10.2 fL (ref 7.5–12.5)
Monocytes Relative: 10.9 %
Neutro Abs: 4073 cells/uL (ref 1500–7800)
Neutrophils Relative %: 65.7 %
Platelets: 266 10*3/uL (ref 140–400)
RBC: 4.58 10*6/uL (ref 4.20–5.80)
RDW: 13 % (ref 11.0–15.0)
Total Lymphocyte: 19.1 %
WBC: 6.2 10*3/uL (ref 3.8–10.8)

## 2022-09-05 LAB — MAGNESIUM: Magnesium: 2.1 mg/dL (ref 1.5–2.5)

## 2022-09-05 LAB — TSH: TSH: 3.4 mIU/L (ref 0.40–4.50)

## 2022-09-11 DIAGNOSIS — G4733 Obstructive sleep apnea (adult) (pediatric): Secondary | ICD-10-CM | POA: Diagnosis not present

## 2022-09-13 DIAGNOSIS — S61242A Puncture wound with foreign body of right middle finger without damage to nail, initial encounter: Secondary | ICD-10-CM | POA: Diagnosis not present

## 2022-09-13 DIAGNOSIS — W458XXA Other foreign body or object entering through skin, initial encounter: Secondary | ICD-10-CM | POA: Diagnosis not present

## 2022-09-16 ENCOUNTER — Other Ambulatory Visit: Payer: Self-pay | Admitting: Nurse Practitioner

## 2022-09-16 DIAGNOSIS — E782 Mixed hyperlipidemia: Secondary | ICD-10-CM

## 2022-10-12 DIAGNOSIS — G4733 Obstructive sleep apnea (adult) (pediatric): Secondary | ICD-10-CM | POA: Diagnosis not present

## 2022-10-27 ENCOUNTER — Other Ambulatory Visit (HOSPITAL_COMMUNITY): Payer: Self-pay | Admitting: Internal Medicine

## 2022-10-27 DIAGNOSIS — I4819 Other persistent atrial fibrillation: Secondary | ICD-10-CM

## 2022-10-29 NOTE — Telephone Encounter (Signed)
Prescription refill request for Eliquis received. Indication:afib Last office visit:12/23 Scr:1.10  7/24 Age: 72 Weight:93.6  kg  Prescription refilled

## 2022-11-12 DIAGNOSIS — G4733 Obstructive sleep apnea (adult) (pediatric): Secondary | ICD-10-CM | POA: Diagnosis not present

## 2022-11-30 ENCOUNTER — Encounter: Payer: Medicare HMO | Admitting: Internal Medicine

## 2022-12-10 DIAGNOSIS — G4733 Obstructive sleep apnea (adult) (pediatric): Secondary | ICD-10-CM | POA: Diagnosis not present

## 2022-12-18 ENCOUNTER — Ambulatory Visit (INDEPENDENT_AMBULATORY_CARE_PROVIDER_SITE_OTHER): Payer: Medicare HMO

## 2022-12-18 VITALS — Temp 97.5°F

## 2022-12-18 DIAGNOSIS — Z23 Encounter for immunization: Secondary | ICD-10-CM | POA: Diagnosis not present

## 2022-12-21 ENCOUNTER — Other Ambulatory Visit: Payer: Self-pay | Admitting: Internal Medicine

## 2022-12-30 ENCOUNTER — Encounter: Payer: Self-pay | Admitting: Internal Medicine

## 2022-12-30 NOTE — Patient Instructions (Signed)

## 2022-12-30 NOTE — Progress Notes (Unsigned)
Annual  Screening/Preventative Visit  & Comprehensive Evaluation & Examination   Future Appointments  Date Time Provider Department  12/31/2022              cpe 10:00 AM Lucky Cowboy, MD [facility]  01/14/2024                cpe 10:00 AM Lucky Cowboy, MD [facility]            This very nice 72 y.o. MWM presents for a Screening /Preventative Visit & comprehensive evaluation and management of multiple medical co-morbidities.  Patient has been followed for HTN, HLD, Prediabetes and Vitamin D Deficiency.   Patient is on CPAP for OSA with improved restorative sleep.  Patient has GERD controlled with his diet & meds.        HTN predates circa 2016. Patient's BP has been controlled at home.  Today's BP is not at goal -   162/78  and rechecked at 148/76 .  In 2018 , patient had a normal cardiac Myoview.   In May, 2023  he was dx'd with new Afib & started on Eliquis & BB. On 07/27/21, he  had successful CV by Dr Cristal Deer & now follows with Dr Carolan Clines.  On Aug 22,2023 he had a low risk exercise Lexiscan. Patient denies any cardiac symptoms as chest pain, palpitations, shortness of breath, dizziness or ankle swelling.        Patient's hyperlipidemia is controlled with diet and Atorvastatin. Patient denies myalgias or other medication SE's. Last lipids were at goal :  Lab Results  Component Value Date   CHOL 166 09/04/2022   HDL 61 09/04/2022   LDLCALC 81 09/04/2022   TRIG 143 09/04/2022   CHOLHDL 2.7 09/04/2022         Patient has hx/o prediabetes (A1c 5.9% /2011) and he has not had hypoglycemic symptoms, visual blurring, diabetic polys or paresthesias. Last A1c was normal & at goal :   Lab Results  Component Value Date   HGBA1C 5.5 05/31/2022         Finally, patient has history of Vitamin D Deficiency  ("31" /2008) and last vitamin D was at goal :   Lab Results  Component Value Date   VD25OH 52 05/31/2022        Current Outpatient Medications on File Prior  to Visit  Medication Sig   atorvastatin 80 MG tablet Take  1 tablet  Daily   VITAMIN D 5,000 UnitsUNITS tablet Take  daily.    cyclobenzaprine 10 MG tablet TAKE 1/2 TO 1  TABLET  THREE TIMES DAILY    ibuprofen 800 MG tablet Take  1 tablet  3 x /day  with Meals  as Needed    Omega-3 FISH OIL 1000 MG C    omeprazole 20 MG capsule Take 20 mg by mouth daily.   phentermine 37.5 MG tablet TAKE 1/2 TO 1  TABLET IN THE MORNING    sildenafil  100 MG tablet Take  1/2 to 1 tablet  Daily  if Needed for XXXX    No Known Allergies   Past Medical History:  Diagnosis Date   GERD (gastroesophageal reflux disease)    Hyperlipidemia    Obesity    Prediabetes    Sleep apnea    CPAP   Vitamin D deficiency      Health Maintenance  Topic Date Due   COVID-19 Vaccine (1) Never done   Zoster Vaccines- Shingrix (1 of 2)  Never done   INFLUENZA VACCINE  10/03/2020   COLONOSCOPY (Pts 45-68yrs Insurance coverage will need to be confirmed)  05/02/2021   TETANUS/TDAP  11/10/2024   Hepatitis C Screening  Completed   PNA vac Low Risk Adult  Completed   HPV VACCINES  Aged Out     Immunization History  Administered Date(s) Administered   Influenza Inj Mdck Quad  02/18/2020   Influenza, High Dose  12/24/2016, 01/10/2018, 11/20/2018   PPD Test 11/10/2013, 11/11/2014   Pneumococcal  - 13 10/21/2015   Pneumococcal  - 23 05/31/2016   Pneumococcal  - 23 04/15/2009   Td 11/17/2004   Tdap 11/11/2014   Zoster, Live 10/28/2012    Last Colon - 05/02/2016 - Dr Christella Hartigan - Recc a 5 year f/u   recently deferred when discovered new Afib as above.     Past Surgical History:  Procedure Laterality Date   Lumbar  SURGERY  1992     Family History  Problem Relation Age of Onset   Hypertension Mother    Heart disease Mother    COPD Mother    Heart failure Mother    Heart failure Father    Colon cancer Father        died at 68;pt unsure age,never knew father   Diabetes Brother     Social History    Socioeconomic History   Marital status: Married    Spouse name: Not on file   Number of children: None  Occupational History   Sales, works  PT  in [employer] store  Tobacco Use   Smoking status: Former    Packs/day: 1.00    Years: 32.00    Pack years: 32.00    Types: Cigarettes    Quit date: 02/03/1990    Years since quitting: 30.7   Smokeless tobacco: Never  Substance and Sexual Activity   Alcohol use: Yes    Alcohol/week: 14.0 standard drinks    Types: 14 Standard drinks or equivalent per week    Comment: 7 beers a week plus 1 mixed drink daily.   Drug use: No   Sexual activity: Not Currently      ROS Constitutional: Denies fever, chills, weight loss/gain, headaches, insomnia,  night sweats or change in appetite. Does c/o fatigue. Eyes: Denies redness, blurred vision, diplopia, discharge, itchy or watery eyes.  ENT: Denies discharge, congestion, post nasal drip, epistaxis, sore throat, earache, hearing loss, dental pain, Tinnitus, Vertigo, Sinus pain or snoring.  Cardio: Denies chest pain, palpitations, irregular heartbeat, syncope, dyspnea, diaphoresis, orthopnea, PND, claudication or edema Respiratory: denies cough, dyspnea, DOE, pleurisy, hoarseness, laryngitis or wheezing.  Gastrointestinal: Denies dysphagia, heartburn, reflux, water brash, pain, cramps, nausea, vomiting, bloating, diarrhea, constipation, hematemesis, melena, hematochezia, jaundice or hemorrhoids Genitourinary: Denies dysuria, frequency, discharge, hematuria or flank pain. Has urgency, nocturia x 2-3 & occasional hesitancy. Musculoskeletal: Denies arthralgia, myalgia, stiffness, Jt. Swelling, pain, limp or strain/sprain. Denies Falls. Skin: Denies puritis, rash, hives, warts, acne, eczema or change in skin lesion Neuro: No weakness, tremor, incoordination, spasms, paresthesia or pain Psychiatric: Denies confusion, memory loss or sensory loss. Denies Depression. Endocrine: Denies change in weight, skin, hair  change, nocturia, and paresthesia, diabetic polys, visual blurring or hyper / hypo glycemic episodes.  Heme/Lymph: No excessive bleeding, bruising or enlarged lymph nodes.   Physical Exam  BP (!) 162/78   Pulse (!) 54   Temp 97.9 F (36.6 C)   Resp 16   Ht 5\' 10"  (1.778 m)   Wt 213 lb (  96.6 kg)   SpO2 97%   BMI 30.56 kg/m   General Appearance: Well nourished and well groomed and in no apparent distress.  Eyes: PERRLA, EOMs, conjunctiva no swelling or erythema, normal fundi and vessels. Sinuses: No frontal/maxillary tenderness ENT/Mouth: EACs patent / TMs  nl. Nares clear without erythema, swelling, mucoid exudates. Oral hygiene is good. No erythema, swelling, or exudate. Tongue normal, non-obstructing. Tonsils not swollen or erythematous. Hearing normal.  Neck: Supple, thyroid not palpable. No bruits, nodes or JVD. Respiratory: Respiratory effort normal.  BS equal and clear bilateral without rales, rhonci, wheezing or stridor. Cardio: Heart sounds are normal with slightly irregular rate and rhythm and no murmurs, rubs or gallops. Peripheral pulses are normal and equal bilaterally & pedal pulses 3+/3+ bilaterally without edema. No aortic or femoral bruits. Chest: symmetric with normal excursions and percussion.  Abdomen: Soft, with Nl bowel sounds. Nontender, no guarding, rebound, hernias, masses, or organomegaly.  Lymphatics: Non tender without lymphadenopathy.  Musculoskeletal: Full ROM all peripheral extremities, joint stability, 5/5 strength, and normal gait. Skin: Warm and dry without rashes, lesions, cyanosis, clubbing or  ecchymosis.  Neuro: Cranial nerves intact, reflexes equal bilaterally. Normal muscle tone, no cerebellar symptoms. Sensation intact.  Pysch: Alert and oriented x 3 with normal affect, insight and judgment appropriate.   Assessment and Plan  1. Annual Preventative/Screening Exam    2. Essential hypertension  - Microalbumin / creatinine urine ratio - CBC  with Differential/Platelet - COMPLETE METABOLIC PANEL WITH GFR - Magnesium - TSH - EKG 12-Lead - [place], RETROPERITNL ABD,  LTD - Urinalysis, Routine w reflex microscopic   3. Hyperlipidemia, mixed  - Lipid panel - TSH - EKG 12-Lead - [place], RETROPERITNL ABD,  LTD   4. Abnormal glucose  - Hemoglobin A1c - Insulin, random - EKG 12-Lead - [place], RETROPERITNL ABD,  LTD   5. Vitamin D deficiency  - VITAMIN D 25 Hydroxy    6. Paroxysmal atrial fibrillation (HCC)  - TSH - EKG 12-Lead - > shows back in Afib  - Patient advised f/u with Cardiologist    7. Gastroesophageal reflux disease  - POC Hemoccult Bld/Stl    8. BPH with obstruction/lower urinary tract symptoms  - PSA   9. OSA on CPAP   10. Screening for colorectal cancer  - POC Hemoccult Bld/Stl   11. Prostate cancer screening  - PSA  12. Screening for heart disease  - EKG 12-Lead- > shows back in Afib  - Patient advised f/u with Cardiologist    13. FHx: heart disease  - EKG 12-Lead- > shows back in Afib  - Patient advised f/u with Cardiologist   - [place], RETROPERITNL ABD,  LTD   14. Former smoker  - EKG 12-Lead - [place], RETROPERITNL ABD,  LTD  15. Screening for AAA (aortic abdominal aneurysm)  - [place], RETROPERITNL ABD,  LTD   16. Medication management  - Microalbumin / creatinine urine ratio - CBC with Differential/Platelet - COMPLETE METABOLIC PANEL WITH GFR - Magnesium - Lipid panel - TSH - Hemoglobin A1c - Insulin, random - VITAMIN D 25 Hydroxy - Urinalysis, Routine w reflex microscopic          Patient was counseled in prudent diet, weight control to achieve/maintain BMI less than 25, BP monitoring, regular exercise and medications as discussed.  Discussed med effects and SE's. Routine screening labs and tests as requested with regular follow-up as recommended. Over 40 minutes of exam, counseling, chart review and high complex critical decision making was  performed   Marinus Maw, MD

## 2022-12-31 ENCOUNTER — Encounter: Payer: Self-pay | Admitting: Internal Medicine

## 2022-12-31 ENCOUNTER — Ambulatory Visit (INDEPENDENT_AMBULATORY_CARE_PROVIDER_SITE_OTHER): Payer: Medicare HMO | Admitting: Internal Medicine

## 2022-12-31 VITALS — BP 162/78 | HR 54 | Temp 97.9°F | Resp 16 | Ht 70.0 in | Wt 213.0 lb

## 2022-12-31 DIAGNOSIS — Z79899 Other long term (current) drug therapy: Secondary | ICD-10-CM | POA: Diagnosis not present

## 2022-12-31 DIAGNOSIS — Z1211 Encounter for screening for malignant neoplasm of colon: Secondary | ICD-10-CM

## 2022-12-31 DIAGNOSIS — E782 Mixed hyperlipidemia: Secondary | ICD-10-CM | POA: Diagnosis not present

## 2022-12-31 DIAGNOSIS — E559 Vitamin D deficiency, unspecified: Secondary | ICD-10-CM | POA: Diagnosis not present

## 2022-12-31 DIAGNOSIS — Z125 Encounter for screening for malignant neoplasm of prostate: Secondary | ICD-10-CM

## 2022-12-31 DIAGNOSIS — N138 Other obstructive and reflux uropathy: Secondary | ICD-10-CM | POA: Diagnosis not present

## 2022-12-31 DIAGNOSIS — Z8249 Family history of ischemic heart disease and other diseases of the circulatory system: Secondary | ICD-10-CM

## 2022-12-31 DIAGNOSIS — Z136 Encounter for screening for cardiovascular disorders: Secondary | ICD-10-CM

## 2022-12-31 DIAGNOSIS — K219 Gastro-esophageal reflux disease without esophagitis: Secondary | ICD-10-CM

## 2022-12-31 DIAGNOSIS — Z0000 Encounter for general adult medical examination without abnormal findings: Secondary | ICD-10-CM | POA: Diagnosis not present

## 2022-12-31 DIAGNOSIS — I700 Atherosclerosis of aorta: Secondary | ICD-10-CM | POA: Diagnosis not present

## 2022-12-31 DIAGNOSIS — I10 Essential (primary) hypertension: Secondary | ICD-10-CM

## 2022-12-31 DIAGNOSIS — N401 Enlarged prostate with lower urinary tract symptoms: Secondary | ICD-10-CM | POA: Diagnosis not present

## 2022-12-31 DIAGNOSIS — I480 Paroxysmal atrial fibrillation: Secondary | ICD-10-CM | POA: Diagnosis not present

## 2022-12-31 DIAGNOSIS — R7309 Other abnormal glucose: Secondary | ICD-10-CM

## 2022-12-31 DIAGNOSIS — Z0001 Encounter for general adult medical examination with abnormal findings: Secondary | ICD-10-CM

## 2022-12-31 DIAGNOSIS — Z87891 Personal history of nicotine dependence: Secondary | ICD-10-CM

## 2022-12-31 MED ORDER — OLMESARTAN MEDOXOMIL 20 MG PO TABS: ORAL_TABLET | ORAL | 3 refills | Status: DC

## 2023-01-01 LAB — COMPLETE METABOLIC PANEL WITH GFR
AG Ratio: 2 (calc) (ref 1.0–2.5)
ALT: 28 U/L (ref 9–46)
AST: 20 U/L (ref 10–35)
Albumin: 4.5 g/dL (ref 3.6–5.1)
Alkaline phosphatase (APISO): 90 U/L (ref 35–144)
BUN: 17 mg/dL (ref 7–25)
CO2: 25 mmol/L (ref 20–32)
Calcium: 9.8 mg/dL (ref 8.6–10.3)
Chloride: 105 mmol/L (ref 98–110)
Creat: 1.16 mg/dL (ref 0.70–1.28)
Globulin: 2.2 g/dL (ref 1.9–3.7)
Glucose, Bld: 94 mg/dL (ref 65–99)
Potassium: 4.5 mmol/L (ref 3.5–5.3)
Sodium: 140 mmol/L (ref 135–146)
Total Bilirubin: 0.7 mg/dL (ref 0.2–1.2)
Total Protein: 6.7 g/dL (ref 6.1–8.1)
eGFR: 67 mL/min/{1.73_m2} (ref >=60)

## 2023-01-01 LAB — HEMOGLOBIN A1C
Hgb A1c MFr Bld: 5.6 %{Hb} (ref <5.7)
Mean Plasma Glucose: 114 mg/dL
eAG (mmol/L): 6.3 mmol/L

## 2023-01-01 LAB — LIPID PANEL
Cholesterol: 165 mg/dL (ref <200)
HDL: 69 mg/dL (ref >=40)
LDL Cholesterol (Calc): 74 mg/dL
Non-HDL Cholesterol (Calc): 96 mg/dL (ref <130)
Total CHOL/HDL Ratio: 2.4 (calc) (ref <5.0)
Triglycerides: 132 mg/dL (ref <150)

## 2023-01-01 LAB — URINALYSIS, ROUTINE W REFLEX MICROSCOPIC
Bilirubin Urine: NEGATIVE
Glucose, UA: NEGATIVE
Hgb urine dipstick: NEGATIVE
Ketones, ur: NEGATIVE
Leukocytes,Ua: NEGATIVE
Nitrite: NEGATIVE
Protein, ur: NEGATIVE
Specific Gravity, Urine: 1.02 (ref 1.001–1.035)
pH: 5.5 (ref 5.0–8.0)

## 2023-01-01 LAB — CBC WITH DIFFERENTIAL/PLATELET
Absolute Lymphocytes: 1150 {cells}/uL (ref 850–3900)
Absolute Monocytes: 589 {cells}/uL (ref 200–950)
Basophils Absolute: 28 {cells}/uL (ref 0–200)
Basophils Relative: 0.5 %
Eosinophils Absolute: 248 {cells}/uL (ref 15–500)
Eosinophils Relative: 4.5 %
HCT: 43.6 % (ref 38.5–50.0)
Hemoglobin: 14.2 g/dL (ref 13.2–17.1)
MCH: 31.1 pg (ref 27.0–33.0)
MCHC: 32.6 g/dL (ref 32.0–36.0)
MCV: 95.6 fL (ref 80.0–100.0)
MPV: 10.7 fL (ref 7.5–12.5)
Monocytes Relative: 10.7 %
Neutro Abs: 3487 {cells}/uL (ref 1500–7800)
Neutrophils Relative %: 63.4 %
Platelets: 224 10*3/uL (ref 140–400)
RBC: 4.56 10*6/uL (ref 4.20–5.80)
RDW: 12.6 % (ref 11.0–15.0)
Total Lymphocyte: 20.9 %
WBC: 5.5 10*3/uL (ref 3.8–10.8)

## 2023-01-01 LAB — VITAMIN D 25 HYDROXY (VIT D DEFICIENCY, FRACTURES): Vit D, 25-Hydroxy: 53 ng/mL (ref 30–100)

## 2023-01-01 LAB — MICROALBUMIN / CREATININE URINE RATIO
Creatinine, Urine: 128 mg/dL (ref 20–320)
Microalb Creat Ratio: 11 mg/g{creat} (ref <30)
Microalb, Ur: 1.4 mg/dL

## 2023-01-01 LAB — MAGNESIUM: Magnesium: 1.9 mg/dL (ref 1.5–2.5)

## 2023-01-01 LAB — TSH: TSH: 3.69 m[IU]/L (ref 0.40–4.50)

## 2023-01-01 LAB — PSA: PSA: 0.97 ng/mL (ref <=4.00)

## 2023-01-01 LAB — INSULIN, RANDOM: Insulin: 12 u[IU]/mL

## 2023-01-01 NOTE — Progress Notes (Signed)
<>*<>*<>*<>*<>*<>*<>*<>*<>*<>*<>*<>*<>*<>*<>*<>*<>*<>*<>*<>*<>*<>*<>*<>*<> <>*<>*<>*<>*<>*<>*<>*<>*<>*<>*<>*<>*<>*<>*<>*<>*<>*<>*<>*<>*<>*<>*<>*<>*<>  -  Test results slightly outside the reference range are not unusual. If there is anything important, I will review this with you,  otherwise it is considered normal test values.  If you have further questions,  please do not hesitate to contact me at the office or via My Chart.   <>*<>*<>*<>*<>*<>*<>*<>*<>*<>*<>*<>*<>*<>*<>*<>*<>*<>*<>*<>*<>*<>*<>*<>*<> <>*<>*<>*<>*<>*<>*<>*<>*<>*<>*<>*<>*<>*<>*<>*<>*<>*<>*<>*<>*<>*<>*<>*<>*<>  -  PSA - Normal - No Prostate Cancer - Great  !   <>*<>*<>*<>*<>*<>*<>*<>*<>*<>*<>*<>*<>*<>*<>*<>*<>*<>*<>*<>*<>*<>*<>*<>*<> <>*<>*<>*<>*<>*<>*<>*<>*<>*<>*<>*<>*<>*<>*<>*<>*<>*<>*<>*<>*<>*<>*<>*<>*<>  -  Chol  = 165  &   LDL = 74  -  both   Excellent   - Very low risk for Heart Attack  / Stroke  <>*<>*<>*<>*<>*<>*<>*<>*<>*<>*<>*<>*<>*<>*<>*<>*<>*<>*<>*<>*<>*<>*<>*<>*<> <>*<>*<>*<>*<>*<>*<>*<>*<>*<>*<>*<>*<>*<>*<>*<>*<>*<>*<>*<>*<>*<>*<>*<>*<>  -  Vitamin D = 53 - Sl Low -  Vitamin D goal is between 70-100.   - Please INCREASE  your Vitamin D 5,000 unit capsules                                                                    to 2 capsules = 10,000 units EVERY Day   - It is very important as a natural anti-inflammatory and helping the                                  immune system protect against viral infections, like the Covid-19    helping hair, skin, and nails, as well as reducing stroke and heart attack risk.   - It helps your bones and helps with mood.  - It also decreases numerous cancer risks so please                                                                                           take it as directed.   - Low Vit D is associated with a 200-300% higher risk for CANCER                      and 200-300% higher risk for HEART   ATTACK  &  STROKE.    - It is also associated with higher  death rate at younger ages,      autoimmune diseases like Rheumatoid arthritis, Lupus, Multiple Sclerosis.     - Also many other serious conditions, like depression, Alzheimer's                                                 Dementia,  muscle aches, fatigue, fibromyalgia   <>*<>*<>*<>*<>*<>*<>*<>*<>*<>*<>*<>*<>*<>*<>*<>*<>*<>*<>*<>*<>*<>*<>*<>*<> <>*<>*<>*<>*<>*<>*<>*<>*<>*<>*<>*<>*<>*<>*<>*<>*<>*<>*<>*<>*<>*<>*<>*<>*<>  -  All Else - CBC - Kidneys - Electrolytes - Liver - Magnesium & Thyroid    - all  Normal / OK  <>*<>*<>*<>*<>*<>*<>*<>*<>*<>*<>*<>*<>*<>*<>*<>*<>*<>*<>*<>*<>*<>*<>*<>*<> <>*<>*<>*<>*<>*<>*<>*<>*<>*<>*<>*<>*<>*<>*<>*<>*<>*<>*<>*<>*<>*<>*<>*<>*<>

## 2023-01-10 DIAGNOSIS — G4733 Obstructive sleep apnea (adult) (pediatric): Secondary | ICD-10-CM | POA: Diagnosis not present

## 2023-01-17 ENCOUNTER — Other Ambulatory Visit: Payer: Self-pay | Admitting: Internal Medicine

## 2023-01-29 ENCOUNTER — Other Ambulatory Visit: Payer: Self-pay | Admitting: Internal Medicine

## 2023-01-29 ENCOUNTER — Other Ambulatory Visit: Payer: Self-pay | Admitting: Nurse Practitioner

## 2023-01-29 DIAGNOSIS — E782 Mixed hyperlipidemia: Secondary | ICD-10-CM

## 2023-02-09 DIAGNOSIS — G4733 Obstructive sleep apnea (adult) (pediatric): Secondary | ICD-10-CM | POA: Diagnosis not present

## 2023-02-28 ENCOUNTER — Other Ambulatory Visit: Payer: Self-pay | Admitting: Internal Medicine

## 2023-03-06 HISTORY — PX: CATARACT EXTRACTION: SUR2

## 2023-03-21 ENCOUNTER — Telehealth: Payer: Self-pay | Admitting: Pharmacist

## 2023-03-21 ENCOUNTER — Telehealth: Payer: Self-pay | Admitting: Internal Medicine

## 2023-03-21 ENCOUNTER — Ambulatory Visit: Payer: Medicare HMO | Attending: Internal Medicine | Admitting: Internal Medicine

## 2023-03-21 ENCOUNTER — Other Ambulatory Visit (HOSPITAL_COMMUNITY): Payer: Self-pay

## 2023-03-21 VITALS — BP 122/58 | HR 48 | Ht 70.0 in | Wt 210.0 lb

## 2023-03-21 DIAGNOSIS — R072 Precordial pain: Secondary | ICD-10-CM | POA: Diagnosis not present

## 2023-03-21 DIAGNOSIS — I4819 Other persistent atrial fibrillation: Secondary | ICD-10-CM

## 2023-03-21 DIAGNOSIS — I10 Essential (primary) hypertension: Secondary | ICD-10-CM | POA: Diagnosis not present

## 2023-03-21 DIAGNOSIS — Z79899 Other long term (current) drug therapy: Secondary | ICD-10-CM

## 2023-03-21 DIAGNOSIS — Z8249 Family history of ischemic heart disease and other diseases of the circulatory system: Secondary | ICD-10-CM | POA: Diagnosis not present

## 2023-03-21 NOTE — Telephone Encounter (Signed)
*  STAT* If patient is at the pharmacy, call can be transferred to refill team.   1. Which medications need to be refilled? (please list name of each medication and dose if known)  Xarelto 20 MG  2. Which pharmacy/location (including street and city if local pharmacy) is medication to be sent to? [facility] - [place], [place] - [address]    3. Do they need a 30 day or 90 day supply?   30 day supply

## 2023-03-21 NOTE — Telephone Encounter (Signed)
Xarelto 20mg  refill request received. Pt is 73 years old, weight-95.3kg, Crea-1.16 on 12/31/22, last seen by Dr. Wyline Mood today on 03/21/23, Diagnosis-Afib, CrCl-77.59 mL/min; Dose is appropriate based on dosing criteria.   Eliquis is on the pt's med list and per MD visit today it states continue eliquis.  Spoke with [facility] and they state they have not requested Xarelto. The call came from the pt family member after assesing incoming information. She confirmed that they only have Eliquis on medlist. Assessed chart and saw more info regarding this and per Thayer Ohm, PharmD the pt was advised to contact his agent and call back with which once is cost effective.

## 2023-03-21 NOTE — Telephone Encounter (Signed)
In room consult after appt with Dr Wyline Mood. Patient reports copay for Eliquis was 150 dollars. Does not know if that is a deductible or not. Has meeting with insurance agent coming up. Advised to get prices on eliquis and Xarelto 20mg  and to contact us with what is more affordable for him.

## 2023-03-21 NOTE — Patient Instructions (Addendum)
Medication Instructions:   *If you need a refill on your cardiac medications before your next appointment, please call your pharmacy*   Lab Work: BMP If you have labs (blood work) drawn today and your tests are completely normal, you will receive your results only by: MyChart Message (if you have MyChart) OR A paper copy in the mail If you have any lab test that is abnormal or we need to change your treatment, we will call you to review the results.   Testing/Procedures:   Your cardiac CT will be scheduled at   [facility] [address] [place], [place] [address] [phone]   If scheduled at [facility], please arrive at the [facility] and Children's Entrance (Entrance C2) of [facility] 30 minutes prior to test start time. You can use the FREE valet parking offered at entrance C (encouraged to control the heart rate for the test)  Proceed to the [facility] Radiology Department (first floor) to check-in and test prep.  All radiology patients and guests should use entrance C2 at [facility], accessed from [facility], even though the hospital's physical address listed is [address]    If scheduled at [facility] or [facility], please arrive 15 mins early for check-in and test prep.  There is spacious parking and easy access to the radiology department from the [facility] Heart and Vascular entrance. Please enter here and check-in with the desk attendant.   Please follow these instructions carefully (unless otherwise directed):  An IV will be required for this test and Nitroglycerin will be given.  Hold all erectile dysfunction medications at least 3 days (72 hrs) prior to test. (Ie viagra, cialis, sildenafil, tadalafil, etc)   On the Night Before the Test: Be sure to Drink plenty of water. Do not consume any caffeinated/decaffeinated beverages or chocolate 12 hours prior  to your test. Do not take any antihistamines 12 hours prior to your test.  On the Day of the Test: Drink plenty of water until 1 hour prior to the test. Do not eat any food 1 hour prior to test. You may take your regular medications prior to the test.       After the Test: Drink plenty of water. After receiving IV contrast, you may experience a mild flushed feeling. This is normal. On occasion, you may experience a mild rash up to 24 hours after the test. This is not dangerous. If this occurs, you can take Benadryl 25 mg and increase your fluid intake. If you experience trouble breathing, this can be serious. If it is severe call 911 IMMEDIATELY. If it is mild, please call our office.  We will call to schedule your test 2-4 weeks out understanding that some insurance companies will need an authorization prior to the service being performed.   For more information and frequently asked questions, please visit our website : [email or web address]  For non-scheduling related questions, please contact the cardiac imaging nurse navigator should you have any questions/concerns: Cardiac Imaging Nurse Navigators Direct Office Dial: [phone]   For scheduling needs, including cancellations and rescheduling, please call [place], [phone].    Follow-Up: At [facility], you and your health needs are our priority.  As part of our continuing mission to provide you with exceptional heart care, we have created designated Provider Care Teams.  These Care Teams include your primary Cardiologist (physician) and Advanced Practice Providers (APPs -  Physician Assistants  and Nurse Practitioners) who all work together to provide you with the care you need, when you need it.  Your next appointment:   6 month(s)  Provider:   Marjie Skiff, PA-C        Other Instructions

## 2023-03-21 NOTE — Progress Notes (Signed)
Cardiology Office Note:    Date:  03/21/2023   ID:  Jason Dennis, DOB 12-12-1950, MRN [id number]  PCP:  Lucky Cowboy, MD   [facility] Providers Cardiologist:  Maisie Fus, MD     Referring MD: Lucky Cowboy, MD   No chief complaint on file. New Onset atrial fibrillation  History of Present Illness:    Jason Dennis is a 73 y.o. male with a hx of GERD, HTN, sleep apnea on CPAP, who presented to the ED 5/3 for new onset afib  He was prepping for colonoscopy and then he was noted to have tachycardia and EKG showed afib. He was evaluated in the ED. Started on eliquis and BB. He is asymptomatic currently.  He was seen in afib clinic today. Recommended DCCV. Wanted to follow up here first. This is his first visit. He has no other cardiac disease history.  Interim 33/10 Hx Jason Dennis comes in today with his wife. He is s/p DCCV. He converted to NSR. He denies further palpitations. He notes that he has noticed chest tightness and SOB with carrying heavy cases. He works at the [employer] and noticed this recently.  Interim 02/15/2022 He had CP last visit, myoview showing apical thinning. In September he had an afib episode. I started him on amiodarone.He saw [facility]. He's in sinus. No SOB. No fatigue. Has knee pain. No CP.   EKG 5/3- Afib HR 110 bpm EKG 07/11/2021- Afib 87 bpm  CMR 08/17/2016 Indication unexplained syncope EF normal No LGE Normal LA/RA size Mild MR   Interim hx 03/21/2023 He lifts weights and notices some pain on the L side. This occurs occasionally. His blood pressure is doing well.   Current Medications: Current Outpatient Medications on File Prior to Visit  Medication Sig Dispense Refill   acetaminophen (TYLENOL) 500 MG tablet Take 1,000 mg by mouth in the morning and at bedtime.     amiodarone (PACERONE) 200 MG tablet Take 1 tablet (200 mg total) by mouth daily. 30 tablet 1   apixaban (ELIQUIS) 5 MG TABS tablet Take 1 tablet by mouth twice daily 180  tablet 1   atorvastatin (LIPITOR) 80 MG tablet TAKE 1 TABLET BY MOUTH ONCE DAILY FOR CHOLESTEROL 90 tablet 0   Cholecalciferol (VITAMIN D) 125 MCG (5000 UT) CAPS Take 5,000 Units by mouth daily.      cyclobenzaprine (FLEXERIL) 10 MG tablet TAKE 1/2 TO 1 (ONE-HALF TO ONE) TABLET BY MOUTH THREE TIMES DAILY AS NEEDED FOR MUSCLE SPASM 90 tablet 0   metoprolol succinate (TOPROL-XL) 50 MG 24 hr tablet Take 1 tablet (50 mg total) by mouth daily. 30 tablet 0   Multiple Vitamins-Minerals (CENTRUM SILVER 50+MEN PO) Take 1 tablet by mouth daily.     olmesartan (BENICAR) 20 MG tablet Take   1 tablet  at night   for BP & Heart                                                 /  TAKE                                         BY                                                 MOUTH 90 tablet 3   Omega-3 Fatty Acids (FISH OIL) 1000 MG CAPS Take 1,000 mg by mouth daily.     omeprazole (PRILOSEC) 20 MG capsule Take 20 mg by mouth daily.     No current facility-administered medications on file prior to visit.    Allergies:   Patient has no known allergies.   Family History: The patient's family history includes COPD in his mother; Colon cancer in his father; Diabetes in his brother; Heart attack in his father; Heart disease in his mother; Heart failure in his father and mother; Hypertension in his mother. There is no history of Esophageal cancer, Rectal cancer, Stomach cancer, or Colon polyps.  ROS:   Please see the history of present illness.     All other systems reviewed and are negative.  EKGs/Labs/Other Studies Reviewed:    The following studies were reviewed today:   EKG:  EKG is  ordered today.  The ekg ordered today demonstrates   07/11/2021-Afib HR 87 QTc 438 ms  EKG Interpretation Date/Time:  Thursday March 21 2023 10:20:34 EST Ventricular Rate:  47 PR Interval:  142 QRS Duration:  90 QT Interval:  510 QTC Calculation: 451 R  Axis:   12  Text Interpretation: Sinus bradycardia When compared with ECG of 12-Jan-2022 09:16, No significant change was found Confirmed by Carolan Clines [id number]) on 03/21/2023 10:26:58 AM   Recent Labs: 12/31/2022: ALT 28; BUN 17; Creat 1.16; Hemoglobin 14.2; Magnesium 1.9; Platelets 224; Potassium 4.5; Sodium 140; TSH 3.69   Recent Lipid Panel    Component Value Date/Time   CHOL 165 12/31/2022 1009   TRIG 132 12/31/2022 1009   HDL 69 12/31/2022 1009   CHOLHDL 2.4 12/31/2022 1009   VLDL 30 09/12/2016 1147   LDLCALC 74 12/31/2022 1009     Risk Assessment/Calculations:      Physical Exam:    VS:  Vitals:   03/21/23 1016  BP: (!) 122/58  Pulse: (!) 48  SpO2: 94%     Wt Readings from Last 3 Encounters:  12/31/22 213 lb (96.6 kg)  09/04/22 206 lb 6.4 oz (93.6 kg)  05/31/22 207 lb 12.8 oz (94.3 kg)     GEN:  Well nourished, well developed in no acute distress HEENT: Normal NECK: No JVD; No carotid bruits LYMPHATICS: No lymphadenopathy CARDIAC: R,R,R, no murmurs, rubs, gallops RESPIRATORY:  Clear to auscultation without rales, wheezing or rhonchi  ABDOMEN: Soft, non-tender, non-distended MUSCULOSKELETAL:  No edema; No deformity  SKIN: Warm and dry NEUROLOGIC:  Alert and oriented x 3 PSYCHIATRIC:  Normal affect   ASSESSMENT and PLAN:  Possible cardiac CP He underwent a Lexiscan SPECT in August 2023.  This noted concern for apical thinning otherwise low risk.  With persistent chest pain and age will repeat an ischemic evaluation --->coronary CTA with morphology  Paroxysmal Afib:  CHADS2VASC=2 We discussed sleep apnea being associated and he is compliant with his cpap. No thyroid dx. Prior MR in 2018 showed normal  EF and LA size. Would be a good PVI/AAD candidate if persistent afib develops. He is s/p DCCV with 150Jx1 07/27/2021 and converted to NSR. Started amiodarone in 11/2021. In sinus rhythm - continue amiodarone 200 mg daily - continue metoprolol XL 50 mg daily; if  HR gets lower , can reduce the dose - continue eliquis 5 mg BID [he can stop his eliquis prior to any surgeries; unless he is planned for DCCV, or just had one]  HTN: Well controlled. Continue current medications  HLD: continue atorvastatin 80 mg daily  OSA: continue CPAP  DISPO:   In order of problems listed above:   Follow up in 6 months with an APP     Medication Adjustments/Labs and Tests Ordered: Current medicines are reviewed at length with the patient today.  Concerns regarding medicines are outlined above.  Orders Placed This Encounter  Procedures   EKG 12-Lead   No orders of the defined types were placed in this encounter.   There are no Patient Instructions on file for this visit.   Signed, Maisie Fus, MD  03/21/2023 10:13 AM    [facility]

## 2023-03-22 LAB — BASIC METABOLIC PANEL
BUN/Creatinine Ratio: 20 (ref 10–24)
BUN: 21 mg/dL (ref 8–27)
CO2: 21 mmol/L (ref 20–29)
Calcium: 9.8 mg/dL (ref 8.6–10.2)
Chloride: 105 mmol/L (ref 96–106)
Creatinine, Ser: 1.04 mg/dL (ref 0.76–1.27)
Glucose: 89 mg/dL (ref 70–99)
Potassium: 4.7 mmol/L (ref 3.5–5.2)
Sodium: 140 mmol/L (ref 134–144)
eGFR: 76 mL/min/{1.73_m2} (ref >59)

## 2023-03-26 ENCOUNTER — Telehealth: Payer: Self-pay | Admitting: Internal Medicine

## 2023-03-26 ENCOUNTER — Encounter: Payer: Self-pay | Admitting: Internal Medicine

## 2023-03-26 NOTE — Telephone Encounter (Signed)
Called and spoke to patient's wife. Verified name and DOB. She reports patient HR has been in the 40's since last office visit 1/16. She stated this morning his HR was 42 before he took his Metoprolol. I advised her to have patient hold Metoprolol if HR below 50. Also advised her to have patient get up slowly, drink fluids and elevate his feet. Patient deny any other symptoms and stated he feels fine. She's also about switching for Eliquis to Xarelto due to cost. I advised her to reach out to her insurance company to see which on is more cost effective and see what her deductible is. Also find out what her copay after he meets his deductible. She verbalized understanding and agree. Will call back with decision on Eliquis vs Xarelto.

## 2023-03-26 NOTE — Telephone Encounter (Signed)
STAT if HR is under 50 or over 120 (normal HR is 60-100 beats per minute)  What is your heart rate? 42  Do you have a log of your heart rate readings (document readings)?   No  Do you have any other symptoms?  Wife stated patient works and some days he feels tired after work   Wife Jason Dennis) called to report patient's heart beats are still reading in the 40's.  Wife wants to know next steps.  Wife also noted patient was supposed to switch from Eliquis to Xarelto and wants prescription sent to [facility] - [address]

## 2023-03-26 NOTE — Telephone Encounter (Signed)
Called patient's wife with no answer. Left message to call back.

## 2023-03-26 NOTE — Telephone Encounter (Signed)
Patient's wife returned my call. Below message relayed. No questions at this time. Verbalized understanding and agree.

## 2023-03-27 ENCOUNTER — Other Ambulatory Visit: Payer: Self-pay

## 2023-03-27 MED ORDER — AMIODARONE HCL 200 MG PO TABS: 200.0000 mg | ORAL_TABLET | Freq: Every day | ORAL | 3 refills | Status: DC

## 2023-03-29 ENCOUNTER — Encounter (HOSPITAL_COMMUNITY): Payer: Self-pay

## 2023-04-02 ENCOUNTER — Ambulatory Visit (HOSPITAL_COMMUNITY)
Admission: RE | Admit: 2023-04-02 | Discharge: 2023-04-02 | Disposition: A | Discharge status: Home / Self Care | Payer: Medicare HMO | Source: Ambulatory Visit | Attending: Internal Medicine | Admitting: Internal Medicine

## 2023-04-02 DIAGNOSIS — R072 Precordial pain: Secondary | ICD-10-CM

## 2023-04-02 MED ORDER — NITROGLYCERIN 0.4 MG SL SUBL
0.8000 mg | SUBLINGUAL_TABLET | Freq: Once | SUBLINGUAL | Status: AC
  Administered 2023-04-02: 0.8 mg via SUBLINGUAL

## 2023-04-02 MED ORDER — NITROGLYCERIN 0.4 MG SL SUBL
SUBLINGUAL_TABLET | SUBLINGUAL | Status: AC
  Filled 2023-04-02: qty 2

## 2023-04-02 MED ORDER — IOHEXOL 350 MG/ML SOLN
95.0000 mL | Freq: Once | INTRAVENOUS | Status: AC | PRN
  Administered 2023-04-02: 95 mL via INTRAVENOUS

## 2023-04-03 ENCOUNTER — Other Ambulatory Visit: Payer: Self-pay

## 2023-04-03 DIAGNOSIS — Z79899 Other long term (current) drug therapy: Secondary | ICD-10-CM

## 2023-04-03 DIAGNOSIS — E782 Mixed hyperlipidemia: Secondary | ICD-10-CM

## 2023-04-03 MED ORDER — EZETIMIBE 10 MG PO TABS: 10.0000 mg | ORAL_TABLET | Freq: Every day | ORAL | 3 refills | Status: DC

## 2023-04-04 NOTE — Telephone Encounter (Signed)
Informed pt wife on nurse response.

## 2023-04-04 NOTE — Telephone Encounter (Signed)
Pt spouse calling about these results. She asked were the medications sent already. Informed her Zetia was sent to the pharmacy yesterday. She is confused about the metoprolol, they were told last week to stop taking it. She asked was this a different type of metoprolol or should he restart what he was taking before. Please advise.

## 2023-04-04 NOTE — Telephone Encounter (Signed)
Pt spouse is calling back in again asking to speaking with nurse about these results.

## 2023-04-11 ENCOUNTER — Telehealth: Payer: Self-pay | Admitting: Internal Medicine

## 2023-04-11 NOTE — Telephone Encounter (Signed)
 Jason Dennis with [facility] calling to give critical report on CT

## 2023-04-11 NOTE — Telephone Encounter (Signed)
 Received call from Jerryl Morin with [facility]  Jerryl Morin calling to report addendum to CTA

## 2023-04-15 ENCOUNTER — Telehealth: Payer: Self-pay | Admitting: *Deleted

## 2023-04-15 DIAGNOSIS — E782 Mixed hyperlipidemia: Secondary | ICD-10-CM

## 2023-04-15 MED ORDER — ROSUVASTATIN CALCIUM 40 MG PO TABS: 40.0000 mg | ORAL_TABLET | Freq: Every day | ORAL | 3 refills | Status: AC

## 2023-04-15 NOTE — Telephone Encounter (Signed)
-----   Message from Nurse Berkley Breech sent at 04/12/2023  7:11 AM EST -----  ----- Message ----- From: Bridgette Campus, MD Sent: 04/11/2023   9:30 AM EST To: Nicky Barrack, LPN  Please let Mr. Mckinnon know that he had no blockages on his CT. He does have a lot of plaque. Please change his lipitor 80 mg daily to crestor  40 mg daily and repeat fasting lipids in 3 months.

## 2023-04-15 NOTE — Telephone Encounter (Signed)
 Spoke with pt, aware of results and recommendations. New script sent to the pharmacy  Lab orders mailed to the pt

## 2023-04-23 NOTE — Telephone Encounter (Signed)
 Maisie Fus, MD I reached out to his PCP and let him know about it so that he can plan surveillance. Likely would benefit with smoking hx.

## 2023-05-09 ENCOUNTER — Other Ambulatory Visit (HOSPITAL_COMMUNITY): Payer: Self-pay | Admitting: Internal Medicine

## 2023-05-09 DIAGNOSIS — I4819 Other persistent atrial fibrillation: Secondary | ICD-10-CM

## 2023-05-09 NOTE — Telephone Encounter (Signed)
 Prescription refill request for Eliquis received. Indication: afib  Last office visit: [place], 03/21/2023 Scr: 1.04, 03/21/2023 Age: 73 yo  Weight: 95.3 kg   Refill sent.

## 2023-06-19 ENCOUNTER — Telehealth: Payer: Self-pay | Admitting: Family Medicine

## 2023-06-19 ENCOUNTER — Other Ambulatory Visit: Payer: Self-pay

## 2023-06-19 ENCOUNTER — Ambulatory Visit: Payer: Medicare HMO | Admitting: Family Medicine

## 2023-06-19 ENCOUNTER — Encounter: Payer: Self-pay | Admitting: Family Medicine

## 2023-06-19 ENCOUNTER — Other Ambulatory Visit: Payer: Self-pay | Admitting: Family Medicine

## 2023-06-19 VITALS — BP 132/72 | HR 50 | Temp 97.8°F | Ht 70.0 in | Wt 209.0 lb

## 2023-06-19 DIAGNOSIS — G4733 Obstructive sleep apnea (adult) (pediatric): Secondary | ICD-10-CM

## 2023-06-19 DIAGNOSIS — E559 Vitamin D deficiency, unspecified: Secondary | ICD-10-CM

## 2023-06-19 DIAGNOSIS — I10 Essential (primary) hypertension: Secondary | ICD-10-CM

## 2023-06-19 DIAGNOSIS — D6869 Other thrombophilia: Secondary | ICD-10-CM

## 2023-06-19 DIAGNOSIS — E782 Mixed hyperlipidemia: Secondary | ICD-10-CM | POA: Diagnosis not present

## 2023-06-19 DIAGNOSIS — R202 Paresthesia of skin: Secondary | ICD-10-CM | POA: Diagnosis not present

## 2023-06-19 DIAGNOSIS — R7401 Elevation of levels of liver transaminase levels: Secondary | ICD-10-CM

## 2023-06-19 DIAGNOSIS — Z87891 Personal history of nicotine dependence: Secondary | ICD-10-CM

## 2023-06-19 DIAGNOSIS — I480 Paroxysmal atrial fibrillation: Secondary | ICD-10-CM | POA: Diagnosis not present

## 2023-06-19 DIAGNOSIS — E663 Overweight: Secondary | ICD-10-CM

## 2023-06-19 LAB — CBC WITH DIFFERENTIAL/PLATELET
Basophils Absolute: 0 10*3/uL (ref 0.0–0.1)
Basophils Relative: 0.8 % (ref 0.0–3.0)
Eosinophils Absolute: 0.2 10*3/uL (ref 0.0–0.7)
Eosinophils Relative: 4.1 % (ref 0.0–5.0)
HCT: 40.1 % (ref 39.0–52.0)
Hemoglobin: 13.3 g/dL (ref 13.0–17.0)
Lymphocytes Relative: 20.1 % (ref 12.0–46.0)
Lymphs Abs: 0.8 10*3/uL (ref 0.7–4.0)
MCHC: 33.3 g/dL (ref 30.0–36.0)
MCV: 94.7 fl (ref 78.0–100.0)
Monocytes Absolute: 0.4 10*3/uL (ref 0.1–1.0)
Monocytes Relative: 9.5 % (ref 3.0–12.0)
Neutro Abs: 2.8 10*3/uL (ref 1.4–7.7)
Neutrophils Relative %: 65.5 % (ref 43.0–77.0)
Platelets: 208 10*3/uL (ref 150.0–400.0)
RBC: 4.23 Mil/uL (ref 4.22–5.81)
RDW: 13.7 % (ref 11.5–15.5)
WBC: 4.2 10*3/uL (ref 4.0–10.5)

## 2023-06-19 LAB — VITAMIN B12: Vitamin B-12: 456 pg/mL (ref 211–911)

## 2023-06-19 LAB — VITAMIN D 25 HYDROXY (VIT D DEFICIENCY, FRACTURES): VITD: 68.62 ng/mL (ref 30.00–100.00)

## 2023-06-19 LAB — COMPREHENSIVE METABOLIC PANEL WITH GFR
ALT: 66 U/L — ABNORMAL HIGH (ref 0–53)
AST: 37 U/L (ref 0–37)
Albumin: 4.6 g/dL (ref 3.5–5.2)
Alkaline Phosphatase: 58 U/L (ref 39–117)
BUN: 19 mg/dL (ref 6–23)
CO2: 25 meq/L (ref 19–32)
Calcium: 9.5 mg/dL (ref 8.4–10.5)
Chloride: 104 meq/L (ref 96–112)
Creatinine, Ser: 1.11 mg/dL (ref 0.40–1.50)
GFR: 66.24 mL/min (ref >60.00)
Glucose, Bld: 110 mg/dL — ABNORMAL HIGH (ref 70–99)
Potassium: 4.2 meq/L (ref 3.5–5.1)
Sodium: 137 meq/L (ref 135–145)
Total Bilirubin: 0.6 mg/dL (ref 0.2–1.2)
Total Protein: 6.6 g/dL (ref 6.0–8.3)

## 2023-06-19 LAB — MAGNESIUM: Magnesium: 1.9 mg/dL (ref 1.5–2.5)

## 2023-06-19 LAB — LIPID PANEL
Cholesterol: 95 mg/dL (ref 0–200)
HDL: 50.6 mg/dL (ref >39.00)
LDL Cholesterol: 26 mg/dL (ref 0–99)
NonHDL: 44.08
Total CHOL/HDL Ratio: 2
Triglycerides: 88 mg/dL (ref 0.0–149.0)
VLDL: 17.6 mg/dL (ref 0.0–40.0)

## 2023-06-19 LAB — HEMOGLOBIN A1C: Hgb A1c MFr Bld: 5.3 % (ref 4.6–6.5)

## 2023-06-19 LAB — TSH: TSH: 4.71 u[IU]/mL (ref 0.35–5.50)

## 2023-06-19 LAB — FERRITIN: Ferritin: 101.6 ng/mL (ref 22.0–322.0)

## 2023-06-19 NOTE — Patient Instructions (Signed)
 Please go downstairs for labs before you leave today.  Take on the medications you are taking and are not taking and let us  know for sure.  Are you taking metoprolol?  Amiodarone?  Olmesartan?  Schedule your 55-month follow-up for July with cardiology.  We will be in touch with your results from today.

## 2023-06-19 NOTE — Progress Notes (Signed)
 New Patient Office Visit  Subjective    Patient ID: Jason Dennis, male    DOB: 1950-07-19  Age: 73 y.o. MRN: [id number]  CC:  Chief Complaint  Patient presents with   Establish Care    Needs PCP and referrals to Cardiologist     HPI Jason Dennis presents to establish care Previous PCP passed away.  He sees cardiology.  However, his cardiologist is no longer at the practice.  He was advised he should follow-up in 6 months with an APP.  A-fib -paroxysmal. Reports good compliance with Eliquis and he is not sure if he is taking a beta blocker or amiodarone.  He will need to check.  Checks BP and pulse at home   OSA on CPAP. Good compliance with CPAP.   HLD- states he is taking a statin   Gets tired in the afternoon. Feels great up until then. This is not new.  Not as tired on off days.   Feet tingling for months    Married  Works for [employer] Encounter Medications as of 06/19/2023  Medication Sig   acetaminophen (TYLENOL) 500 MG tablet Take 1,000 mg by mouth in the morning and at bedtime.   amiodarone (PACERONE) 200 MG tablet Take 1 tablet (200 mg total) by mouth daily.   apixaban (ELIQUIS) 5 MG TABS tablet Take 1 tablet by mouth twice daily   Cholecalciferol (VITAMIN D) 125 MCG (5000 UT) CAPS Take 5,000 Units by mouth daily.    ezetimibe (ZETIA) 10 MG tablet Take 1 tablet (10 mg total) by mouth daily.   Multiple Vitamins-Minerals (CENTRUM SILVER 50+MEN PO) Take 1 tablet by mouth daily.   olmesartan (BENICAR) 20 MG tablet Take   1 tablet  at night   for BP & Heart                                                 /                                                                   TAKE                                         BY                                                 MOUTH   Omega-3 Fatty Acids (FISH OIL) 1000 MG CAPS Take 1,000 mg by mouth daily.   omeprazole (PRILOSEC) 20 MG capsule Take 20 mg by mouth daily.   rosuvastatin (CRESTOR) 40 MG tablet  Take 1 tablet (40 mg total) by mouth daily.   [DISCONTINUED] cyclobenzaprine (FLEXERIL) 10 MG tablet TAKE 1/2 TO 1 (ONE-HALF TO ONE) TABLET BY MOUTH THREE TIMES DAILY AS NEEDED FOR MUSCLE SPASM   [DISCONTINUED] metoprolol succinate (TOPROL-XL) 50 MG 24 hr tablet Take  1 tablet (50 mg total) by mouth daily.   No facility-administered encounter medications on file as of 06/19/2023.    Past Medical History:  Diagnosis Date   Arthritis    Cataract    GERD (gastroesophageal reflux disease)    Hyperlipidemia    Obesity    Prediabetes    Sleep apnea    CPAP   Vitamin D deficiency     Past Surgical History:  Procedure Laterality Date   CARDIOVERSION N/A 07/27/2021   Procedure: CARDIOVERSION;  Surgeon: Sheryle Donning, MD;  Location: [facility] ENDOSCOPY;  Service: Cardiovascular;  Laterality: N/A;   COLONOSCOPY  05/02/2016   Dr.Jacobs   EYE SURGERY     JOINT REPLACEMENT     SPINE SURGERY     [phone]    Family History  Problem Relation Age of Onset   Hypertension Mother    Heart disease Mother    COPD Mother    Heart failure Mother    Kidney disease Mother    Heart failure Father    Colon cancer Father        died at 68;pt unsure age,never knew father   Heart attack Father    Diabetes Brother    Esophageal cancer Neg Hx    Rectal cancer Neg Hx    Stomach cancer Neg Hx    Colon polyps Neg Hx     Social History   Socioeconomic History   Marital status: Married    Spouse name: Not on file   Number of children: Not on file   Years of education: Not on file   Highest education level: 12th grade  Occupational History   Not on file  Tobacco Use   Smoking status: Former    Current packs/day: 0.00    Average packs/day: 1 pack/day for 32.0 years (32.0 ttl pk-yrs)    Types: Cigarettes    Start date: 02/03/1958    Quit date: 02/03/1990    Years since quitting: 33.3   Smokeless tobacco: Never   Tobacco comments:    Former smoker 07/11/21  Vaping Use   Vaping status:  Never Used  Substance and Sexual Activity   Alcohol use: Yes    Alcohol/week: 14.0 standard drinks of alcohol    Types: 14 Standard drinks or equivalent per week    Comment: 2-3 drinks per night per pt 07/11/21   Drug use: No   Sexual activity: Not Currently  Other Topics Concern   Not on file  Social History Narrative   Not on file   Social Drivers of Health   Financial Resource Strain: Low Risk  (06/18/2023)   Overall Financial Resource Strain (CARDIA)    Difficulty of Paying Living Expenses: Not hard at all  Food Insecurity: No Food Insecurity (06/18/2023)   Hunger Vital Sign    Worried About Running Out of Food in the Last Year: Never true    Ran Out of Food in the Last Year: Never true  Transportation Needs: No Transportation Needs (06/18/2023)   PRAPARE - Administrator, Civil Service (Medical): No    Lack of Transportation (Non-Medical): No  Physical Activity: Unknown (06/18/2023)   Exercise Vital Sign    Days of Exercise per Week: 0 days    Minutes of Exercise per Session: Not on file  Stress: No Stress Concern Present (06/18/2023)   [employer] of Occupational Health - Occupational Stress Questionnaire    Feeling of Stress : Not at all  Social Connections: Unknown (06/18/2023)  Social Connection and Isolation Panel [NHANES]    Frequency of Communication with Friends and Family: Three times a week    Frequency of Social Gatherings with Friends and Family: Three times a week    Attends Religious Services: Never    Active Member of Clubs or Organizations: Not on file    Attends Banker Meetings: Not on file    Marital Status: Married  Catering manager Violence: Not on file    Review of Systems  Constitutional:  Positive for malaise/fatigue. Negative for chills and fever.       Mild fatigue some afternoons   Eyes:  Negative for blurred vision and double vision.  Respiratory:  Negative for shortness of breath.   Cardiovascular:  Negative  for chest pain, palpitations, orthopnea and leg swelling.  Gastrointestinal:  Positive for abdominal pain. Negative for constipation, diarrhea, nausea and vomiting.  Neurological:  Positive for tingling. Negative for dizziness, focal weakness, weakness and headaches.       Bilateral foot tingling intermittent x months to years   Endo/Heme/Allergies:  Bruises/bleeds easily.  Psychiatric/Behavioral:  Negative for depression and substance abuse. The patient is not nervous/anxious and does not have insomnia.         Objective    BP 132/72 (BP Location: Left Arm, Patient Position: Sitting)   Pulse (!) 50   Temp 97.8 F (36.6 C) (Temporal)   Ht 5\' 10"  (1.778 m)   Wt 209 lb (94.8 kg)   SpO2 99%   BMI 29.99 kg/m   Physical Exam Constitutional:      General: He is not in acute distress.    Appearance: He is not ill-appearing.  HENT:     Mouth/Throat:     Mouth: Mucous membranes are moist.  Eyes:     Extraocular Movements: Extraocular movements intact.     Conjunctiva/sclera: Conjunctivae normal.  Cardiovascular:     Rate and Rhythm: Normal rate and regular rhythm.  Pulmonary:     Effort: Pulmonary effort is normal.     Breath sounds: Normal breath sounds.  Musculoskeletal:        General: Normal range of motion.     Cervical back: Normal range of motion and neck supple.     Right lower leg: No edema.     Left lower leg: No edema.  Skin:    General: Skin is warm and dry.  Neurological:     General: No focal deficit present.     Mental Status: He is alert and oriented to person, place, and time.     Cranial Nerves: No cranial nerve deficit.     Motor: No weakness.     Coordination: Coordination normal.     Gait: Gait normal.  Psychiatric:        Mood and Affect: Mood normal.        Behavior: Behavior normal.        Thought Content: Thought content normal.        Assessment & Plan:   Problem List Items Addressed This Visit     Former smoker (32 pack year, quit  1991)   Hyperlipidemia, mixed   Relevant Orders   Lipid panel (Completed)   Hypertension - Primary   Relevant Orders   CBC with Differential/Platelet (Completed)   Comprehensive metabolic panel with GFR (Completed)   OSA on CPAP   Overweight (BMI 25.0-29.9)   Relevant Orders   TSH (Completed)   Hemoglobin A1c (Completed)   Secondary hypercoagulable state (HCC)  Relevant Orders   CBC with Differential/Platelet (Completed)   Comprehensive metabolic panel with GFR (Completed)   Vitamin D deficiency   Relevant Orders   VITAMIN D 25 Hydroxy (Vit-D Deficiency, Fractures) (Completed)   Other Visit Diagnoses       Paroxysmal atrial fibrillation (HCC)       Relevant Orders   CBC with Differential/Platelet (Completed)   Comprehensive metabolic panel with GFR (Completed)   Lipid panel (Completed)     Paresthesia of foot, bilateral       Relevant Orders   CBC with Differential/Platelet (Completed)   Comprehensive metabolic panel with GFR (Completed)   TSH (Completed)   Magnesium (Completed)   Vitamin B12 (Completed)   Ferritin (Completed)      He is a pleasant 73 year old male who is new to the practice here to establish care.  Reviewed notes from previous PCP and cardiology visit.  Reviewed recent labs. He will need to check with his wife and call back regarding which medications he is taking. He is on chronic anticoagulation for paroxysmal A-fib.  He is followed by cardiology. Reports blood pressure is controlled at home. Reports good compliance with CPAP for OSA. Reports being in his usual state of health. Ongoing tingling in his feet.  Check labs to look for underlying etiology. Follow-up pending labs or in 3 months fasting.  Return in about 3 months (around 09/18/2023) for chronic health conditions.   Alyson Back, NP-C

## 2023-06-19 NOTE — Telephone Encounter (Signed)
 Copied from CRM [id number]. Topic: General - Other >> Jun 19, 2023  3:34 PM Aisha D wrote: Reason for CRM: Patient's wife is calling to provide Alyson Back, NP,  the names of the medications the patient is taking currently. The patient is taking rosuvastatin (CRESTOR) 40 MG tablet once daily, amiodarone (PACERONE) 200 MG tablet once daily, ezetimibe (ZETIA) 10 MG tablet once daily, apixaban (ELIQUIS) 5 MG TABS tablet twice daily.

## 2023-06-19 NOTE — Telephone Encounter (Signed)
 Called pt and clarified, removed any non-taking medications

## 2023-06-19 NOTE — Telephone Encounter (Signed)
 Fyi.. these are in pt chart

## 2023-07-01 ENCOUNTER — Ambulatory Visit: Payer: Medicare HMO | Admitting: Nurse Practitioner

## 2023-07-23 ENCOUNTER — Other Ambulatory Visit (INDEPENDENT_AMBULATORY_CARE_PROVIDER_SITE_OTHER)

## 2023-07-23 DIAGNOSIS — R7401 Elevation of levels of liver transaminase levels: Secondary | ICD-10-CM | POA: Diagnosis not present

## 2023-07-23 LAB — HEPATIC FUNCTION PANEL
ALT: 41 U/L (ref 0–53)
AST: 25 U/L (ref 0–37)
Albumin: 4.7 g/dL (ref 3.5–5.2)
Alkaline Phosphatase: 63 U/L (ref 39–117)
Bilirubin, Direct: 0.2 mg/dL (ref 0.0–0.3)
Total Bilirubin: 0.7 mg/dL (ref 0.2–1.2)
Total Protein: 6.8 g/dL (ref 6.0–8.3)

## 2023-07-24 ENCOUNTER — Ambulatory Visit: Payer: Self-pay | Admitting: Family Medicine

## 2023-07-24 LAB — HEPATITIS PANEL, ACUTE
Hep A IgM: NONREACTIVE
Hep B C IgM: NONREACTIVE
Hepatitis B Surface Ag: NONREACTIVE
Hepatitis C Ab: NONREACTIVE

## 2023-08-01 ENCOUNTER — Telehealth: Payer: Self-pay | Admitting: Family Medicine

## 2023-08-01 NOTE — Telephone Encounter (Signed)
 Patient dropped off document Handicap Placard, to be filled out by provider. Patient requested to send it back via Call Patient to pick up within 7-days. Document is located in providers tray at front office.Please advise at [facility] [phone]

## 2023-08-01 NOTE — Telephone Encounter (Signed)
 Form received, given to pcp for signature

## 2023-08-02 NOTE — Telephone Encounter (Signed)
 PCP does not have documentation for reasoning for 5 yr placard and would like for pt to have an appt. Booked next available for pt to come in on June 25 for re-certification.

## 2023-08-08 ENCOUNTER — Ambulatory Visit (INDEPENDENT_AMBULATORY_CARE_PROVIDER_SITE_OTHER)

## 2023-08-08 VITALS — Ht 70.0 in | Wt 206.0 lb

## 2023-08-08 DIAGNOSIS — Z1211 Encounter for screening for malignant neoplasm of colon: Secondary | ICD-10-CM

## 2023-08-08 DIAGNOSIS — K635 Polyp of colon: Secondary | ICD-10-CM

## 2023-08-08 DIAGNOSIS — Z0000 Encounter for general adult medical examination without abnormal findings: Secondary | ICD-10-CM

## 2023-08-08 DIAGNOSIS — I4819 Other persistent atrial fibrillation: Secondary | ICD-10-CM

## 2023-08-08 NOTE — Progress Notes (Addendum)
 Subjective:  Please attest and cosign this visit due to patients primary care provider not being in the office at the time the visit was completed.  (Pt of Jason Back, NP)   Jason Dennis is a 73 y.o. who presents  for a Medicare Wellness preventive visit.  As a reminder, Annual Wellness Visits don't include a physical exam, and some assessments may be limited, especially if this visit is performed virtually. We may recommend an in-person follow-up visit with your provider if needed.  Visit Complete: Virtual I connected with  Shiquan Mathieu on 08/12/23 by a audio enabled telemedicine application and verified that I am speaking with the correct person using two identifiers.  Patient Location: Home  Provider Location: Office/Clinic  I discussed the limitations of evaluation and management by telemedicine. The patient expressed understanding and agreed to proceed.  Vital Signs: Because this visit was a virtual/telehealth visit, some criteria may be missing or patient reported. Any vitals not documented were not able to be obtained and vitals that have been documented are patient reported.  VideoDeclined- This patient declined Librarian, academic. Therefore the visit was completed with audio only.  Persons Participating in Visit: Patient.  AWV Questionnaire: No: Patient Medicare AWV questionnaire was not completed prior to this visit.  Cardiac Risk Factors include: advanced age (>30men, >11 women);dyslipidemia;hypertension;obesity (BMI >30kg/m2);male gender     Objective:     Today's Vitals   08/08/23 1507  Weight: 206 lb (93.4 kg)  Height: 5\' 10"  (1.778 m)   Body mass index is 29.56 kg/m.     08/08/2023    3:06 PM 09/04/2022    4:20 PM 03/01/2022    3:39 PM 07/27/2021   10:25 AM 07/04/2021   10:40 AM 07/04/2021    9:01 AM 02/21/2021    3:56 PM  Advanced Directives  Does Patient Have a Medical Advance Directive? Yes Yes Yes No No Yes Yes  Type of  Estate agent of [place];Living will Living will;Healthcare Power of [employer] Power of [place];Living will   Healthcare Power of [place];Living will Healthcare Power of [place];Living will  Does patient want to make changes to medical advance directive?  No - Patient declined No - Patient declined    No - Patient declined  Copy of Healthcare Power of Attorney in Chart? No - copy requested No - copy requested No - copy requested    No - copy requested  Would patient like information on creating a medical advance directive?    No - Patient declined       Current Medications (verified) Outpatient Encounter Medications as of 08/08/2023  Medication Sig   acetaminophen (TYLENOL) 500 MG tablet Take 1,000 mg by mouth in the morning and at bedtime.   amiodarone  (PACERONE ) 200 MG tablet Take 1 tablet (200 mg total) by mouth daily.   apixaban  (ELIQUIS ) 5 MG TABS tablet Take 1 tablet by mouth twice daily   Cholecalciferol (VITAMIN D ) 125 MCG (5000 UT) CAPS Take 5,000 Units by mouth daily.    Multiple Vitamins-Minerals (CENTRUM SILVER 50+MEN PO) Take 1 tablet by mouth daily.   olmesartan  (BENICAR ) 20 MG tablet Take   1 tablet  at night   for BP & Heart                                                 /  TAKE                                         BY                                                 MOUTH   Omega-3 Fatty Acids (FISH OIL) 1000 MG CAPS Take 1,000 mg by mouth daily.   omeprazole (PRILOSEC) 20 MG capsule Take 20 mg by mouth daily.   ezetimibe  (ZETIA ) 10 MG tablet Take 1 tablet (10 mg total) by mouth daily.   rosuvastatin  (CRESTOR ) 40 MG tablet Take 1 tablet (40 mg total) by mouth daily.   No facility-administered encounter medications on file as of 08/08/2023.    Allergies (verified) Patient has no known allergies.   History: Past Medical History:  Diagnosis Date   Arthritis    Cataract     GERD (gastroesophageal reflux disease)    Hyperlipidemia    Obesity    Prediabetes    Sleep apnea    CPAP   Vitamin D  deficiency    Past Surgical History:  Procedure Laterality Date   CARDIOVERSION N/A 07/27/2021   Procedure: CARDIOVERSION;  Surgeon: Sheryle Donning, MD;  Location: [facility] ENDOSCOPY;  Service: Cardiovascular;  Laterality: N/A;   COLONOSCOPY  05/02/2016   Dr.Jacobs   EYE SURGERY     JOINT REPLACEMENT     SPINE SURGERY     [phone]   Family History  Problem Relation Age of Onset   Hypertension Mother    Heart disease Mother    COPD Mother    Heart failure Mother    Kidney disease Mother    Heart failure Father    Colon cancer Father        died at 68;pt unsure age,never knew father   Heart attack Father    Diabetes Brother    Esophageal cancer Neg Hx    Rectal cancer Neg Hx    Stomach cancer Neg Hx    Colon polyps Neg Hx    Social History   Socioeconomic History   Marital status: Married    Spouse name: Not on file   Number of children: Not on file   Years of education: Not on file   Highest education level: 12th grade  Occupational History   Not on file  Tobacco Use   Smoking status: Former    Current packs/day: 0.00    Average packs/day: 1 pack/day for 32.0 years (32.0 ttl pk-yrs)    Types: Cigarettes    Start date: 02/03/1958    Quit date: 02/03/1990    Years since quitting: 33.5   Smokeless tobacco: Never   Tobacco comments:    Former smoker 07/11/21  Vaping Use   Vaping status: Never Used  Substance and Sexual Activity   Alcohol use: Yes    Alcohol/week: 14.0 standard drinks of alcohol    Types: 14 Standard drinks or equivalent per week    Comment: 2-3 drinks per night per pt 07/11/21   Drug use: No   Sexual activity: Not Currently  Other Topics Concern   Not on file  Social History Narrative   Married   Social Drivers of Health   Financial Resource Strain: Low Risk  (08/08/2023)  Overall Financial Resource Strain (CARDIA)     Difficulty of Paying Living Expenses: Not hard at all  Food Insecurity: No Food Insecurity (08/08/2023)   Hunger Vital Sign    Worried About Running Out of Food in the Last Year: Never true    Ran Out of Food in the Last Year: Never true  Transportation Needs: No Transportation Needs (08/08/2023)   PRAPARE - Administrator, Civil Service (Medical): No    Lack of Transportation (Non-Medical): No  Physical Activity: Sufficiently Active (08/08/2023)   Exercise Vital Sign    Days of Exercise per Week: 3 days    Minutes of Exercise per Session: 60 min  Stress: No Stress Concern Present (08/08/2023)   [employer] of Occupational Health - Occupational Stress Questionnaire    Feeling of Stress : Not at all  Social Connections: Moderately Isolated (08/08/2023)   Social Connection and Isolation Panel [NHANES]    Frequency of Communication with Friends and Family: More than three times a week    Frequency of Social Gatherings with Friends and Family: Never    Attends Religious Services: Never    Database administrator or Organizations: No    Attends Banker Meetings: Never    Marital Status: Married    Tobacco Counseling Counseling given: No Tobacco comments: Former smoker 07/11/21    Clinical Intake:  Pre-visit preparation completed: Yes  Pain : No/denies pain     BMI - recorded: 29.56 Nutritional Risks: None Diabetes: No  Lab Results  Component Value Date   HGBA1C 5.3 06/19/2023   HGBA1C 5.6 12/31/2022   HGBA1C 5.5 05/31/2022     How often do you need to have someone help you when you read instructions, pamphlets, or other written materials from your doctor or pharmacy?: 1 - Never  Interpreter Needed?: No  Information entered by :: Kandy Orris, CMA   Activities of Daily Living     08/08/2023    3:09 PM 12/30/2022    9:58 PM  In your present state of health, do you have any difficulty performing the following activities:  Hearing? 0 0   Vision? 0 0  Difficulty concentrating or making decisions? 0 0  Walking or climbing stairs? 0 0  Dressing or bathing? 0 0  Doing errands, shopping? 0 0  Preparing Food and eating ? N   Using the Toilet? N   In the past six months, have you accidently leaked urine? N   Do you have problems with loss of bowel control? N   Managing your Medications? N   Managing your Finances? N   Housekeeping or managing your Housekeeping? N     Patient Care Team: Abram Abraham, NP-C as PCP - General (Family Medicine) Bridgette Campus, MD (Inactive) as PCP - Cardiology (Cardiology) Zula Hitch, MD as Consulting Physician (Ophthalmology)  I have updated your Care Teams any recent Medical Services you may have received from other providers in the past year.     Assessment:    This is a routine wellness examination for [place].  Hearing/Vision screen Hearing Screening - Comments:: Wears hearing aids   Vision Screening - Comments:: Wears rx glasses - up to date with routine eye exams with Dr Joanne Muckle   Goals Addressed               This Visit's Progress     Patient Stated (pt-stated)        Patient stated he wants to continue  staying active and continue fishing       Depression Screen     08/08/2023    3:12 PM 06/19/2023    8:09 AM 12/30/2022    9:58 PM 09/04/2022    4:21 PM 03/01/2022    3:39 PM 11/26/2021    6:57 PM 02/21/2021    3:59 PM  PHQ 2/9 Scores  PHQ - 2 Score 0 0 0 0 0 0 0  PHQ- 9 Score 0 1         Fall Risk     08/08/2023    3:10 PM 12/30/2022    9:57 PM 09/04/2022    4:21 PM 03/01/2022    3:39 PM 11/26/2021    6:57 PM  Fall Risk   Falls in the past year? 0 0 1 0 0  Number falls in past yr: 0  0 0   Injury with Fall? 0  0 0   Risk for fall due to : No Fall Risks No Fall Risks Orthopedic patient No Fall Risks No Fall Risks  Follow up Falls evaluation completed;Falls prevention discussed Falls evaluation completed;Education provided;Falls prevention discussed Falls  evaluation completed;Falls prevention discussed Falls evaluation completed;Falls prevention discussed Falls prevention discussed;Education provided;Falls evaluation completed    MEDICARE RISK AT HOME:  Medicare Risk at Home Any stairs in or around the home?: No If so, are there any without handrails?: No Home free of loose throw rugs in walkways, pet beds, electrical cords, etc?: Yes Adequate lighting in your home to reduce risk of falls?: Yes Life alert?: No Use of a cane, walker or w/c?: No Grab bars in the bathroom?: No Shower chair or bench in shower?: No Elevated toilet seat or a handicapped toilet?: Yes  TIMED UP AND GO:  Was the test performed?  No  Cognitive Function: 6CIT completed        08/08/2023    3:16 PM  6CIT Screen  What Year? 0 points  What month? 0 points  What time? 0 points  Count Dennis from 20 0 points  Months in reverse 0 points  Repeat phrase 2 points  Total Score 2 points    Immunizations Immunization History  Administered Date(s) Administered   Influenza Inj Mdck Quad With Preservative 02/18/2020   Influenza, High Dose Seasonal PF 10/21/2015, 12/24/2016, 01/10/2018, 11/20/2018, 12/22/2020, 12/14/2021, 12/18/2022   PPD Test 11/10/2013, 11/11/2014   Pneumococcal Conjugate-13 10/21/2015   Pneumococcal Polysaccharide-23 05/31/2016   Pneumococcal-Unspecified 04/15/2009   Td 11/17/2004   Tdap 11/11/2014   Zoster, Live 10/28/2012    Screening Tests Health Maintenance  Topic Date Due   Colonoscopy  05/02/2021   Zoster Vaccines- Shingrix (1 of 2) 09/18/2023 (Originally 09/27/1969)   INFLUENZA VACCINE  10/04/2023   Medicare Annual Wellness (AWV)  08/07/2024   DTaP/Tdap/Td (3 - Td or Tdap) 11/10/2024   Pneumonia Vaccine 70+ Years old  Completed   Hepatitis C Screening  Completed   HPV VACCINES  Aged Out   Meningococcal B Vaccine  Aged Out   COVID-19 Vaccine  Discontinued    Health Maintenance  Health Maintenance Due  Topic Date Due    Colonoscopy  05/02/2021   Health Maintenance Items Addressed:   Colonoscopy status: Will consult w/PCP if need GI referral vs Cologuard - pt stated procedure was cancelled due to "A.Fib" episode.      Additional Screening:  Vision Screening: Recommended annual ophthalmology exams for early detection of glaucoma and other disorders of the eye.  Dental Screening: Recommended annual dental  exams for proper oral hygiene  Community Resource Referral / Chronic Care Management: CRR required this visit?  No   CCM required this visit?  No   Plan:    I have personally reviewed and noted the following in the patient's chart:   Medical and social history Use of alcohol, tobacco or illicit drugs  Current medications and supplements including opioid prescriptions. Patient is not currently taking opioid prescriptions. Functional ability and status Nutritional status Physical activity Advanced directives List of other physicians Hospitalizations, surgeries, and ER visits in previous 12 months Vitals Screenings to include cognitive, depression, and falls Referrals and appointments  In addition, I have reviewed and discussed with patient certain preventive protocols, quality metrics, and best practice recommendations. A written personalized care plan for preventive services as well as general preventive health recommendations were provided to patient.   Patria Bookbinder, CMA   08/12/2023   After Visit Summary: (MyChart) Due to this being a telephonic visit, the after visit summary with patients personalized plan was offered to patient via MyChart   Notes: See routing info for review  Addendum 08/09/2023: Per Jarold Merlin, NP>ordered a Cologuard and referral sent to Dr Berry Bristol (Cardiology)

## 2023-08-08 NOTE — Patient Instructions (Addendum)
 Jason Dennis , Thank you for taking time out of your busy schedule to complete your Annual Wellness Visit with me. I enjoyed our conversation and look forward to speaking with you again next year. I, as well as your care team,  appreciate your ongoing commitment to your health goals. Please review the following plan we discussed and let me know if I can assist you in the future. Your Game plan/ To Do List    Referrals: If you haven't heard from the office you've been referred to, please reach out to them at the phone provided.  Due for a Colonoscopy but will consult w/PCP vs Cologuard  Follow up Visits: Next Medicare AWV with our clinical staff: 08/12/2023   Have you seen your provider in the last 6 months (3 months if uncontrolled diabetes)? Yes Next Office Visit with your provider: 09/18/2023  Clinician Recommendations:  Aim for 30 minutes of exercise or brisk walking, 6-8 glasses of water, and 5 servings of fruits and vegetables each day.       This is a list of the screening recommended for you and due dates:  Health Maintenance  Topic Date Due   Colon Cancer Screening  05/02/2021   Zoster (Shingles) Vaccine (1 of 2) 09/18/2023*   Flu Shot  10/04/2023   Medicare Annual Wellness Visit  08/07/2024   DTaP/Tdap/Td vaccine (3 - Td or Tdap) 11/10/2024   Pneumonia Vaccine  Completed   Hepatitis C Screening  Completed   HPV Vaccine  Aged Out   Meningitis B Vaccine  Aged Out   COVID-19 Vaccine  Discontinued  *Topic was postponed. The date shown is not the original due date.    Advanced directives: (Copy Requested) Please bring a copy of your health care power of attorney and living will to the office to be added to your chart at your convenience. You can mail to [facility] [address] [place], [place] [address] or email to [email or web address] Advance Care Planning is important because it:  [x]  Makes sure you receive the medical care that is consistent with your  values, goals, and preferences  [x]  It provides guidance to your family and loved ones and reduces their decisional burden about whether or not they are making the right decisions based on your wishes.  Follow the link provided in your after visit summary or read over the paperwork we have mailed to you to help you started getting your Advance Directives in place. If you need assistance in completing these, please reach out to us  so that we can help you!

## 2023-08-09 NOTE — Addendum Note (Signed)
 Addended by: Patria Bookbinder on: 08/09/2023 12:01 PM   Modules accepted: Orders

## 2023-08-23 LAB — COLOGUARD: COLOGUARD: POSITIVE — AB

## 2023-08-25 ENCOUNTER — Ambulatory Visit: Payer: Self-pay | Admitting: Family Medicine

## 2023-08-25 ENCOUNTER — Other Ambulatory Visit: Payer: Self-pay | Admitting: Family Medicine

## 2023-08-25 DIAGNOSIS — R195 Other fecal abnormalities: Secondary | ICD-10-CM | POA: Insufficient documentation

## 2023-08-25 NOTE — Progress Notes (Signed)
 Please let him know that his Cologuard test is positive. I will refer him to [facility] and they will call him. He will need to discuss having a colonoscopy now to rule out colon cancer with the positive Cologuard test.

## 2023-08-28 ENCOUNTER — Ambulatory Visit: Admitting: Family Medicine

## 2023-08-28 ENCOUNTER — Encounter: Payer: Self-pay | Admitting: Family Medicine

## 2023-08-28 VITALS — BP 122/64 | HR 55 | Temp 97.6°F | Ht 70.0 in | Wt 207.0 lb

## 2023-08-28 DIAGNOSIS — G4733 Obstructive sleep apnea (adult) (pediatric): Secondary | ICD-10-CM | POA: Diagnosis not present

## 2023-08-28 DIAGNOSIS — I10 Essential (primary) hypertension: Secondary | ICD-10-CM | POA: Diagnosis not present

## 2023-08-28 DIAGNOSIS — R195 Other fecal abnormalities: Secondary | ICD-10-CM | POA: Diagnosis not present

## 2023-08-28 DIAGNOSIS — D6869 Other thrombophilia: Secondary | ICD-10-CM

## 2023-08-28 DIAGNOSIS — R0981 Nasal congestion: Secondary | ICD-10-CM

## 2023-08-28 DIAGNOSIS — I4819 Other persistent atrial fibrillation: Secondary | ICD-10-CM

## 2023-08-28 MED ORDER — FLUTICASONE PROPIONATE 50 MCG/ACT NA SUSP: 2.0000 | Freq: Every day | NASAL | 6 refills | Status: AC

## 2023-08-28 NOTE — Progress Notes (Signed)
 Subjective:     Patient ID: Jason Dennis, male    DOB: 09-01-1950, 73 y.o.   MRN: [id number]  Chief Complaint  Patient presents with   Form Completion    Handicap reverification     HPI  History of Present Illness          He is here requesting a handicap placard. States he cannot walk long distances without getting tired. No chest pain or palpitations.  Breathing not any worse.   Drinks alcohol 2-3 drinks per day. Elevated ALT which returned to normal.   Diagnosed with OSA 7-8 years ago. He uses his CPAP nightly.  Notices improvement with snoring and energy level.  No issues however, he needs a new mask. States he has gained some weight and air is escaping.   Nasal congestion on the right. Requests Flonase  nasal spray.    Health Maintenance Due  Topic Date Due   Colonoscopy  05/02/2021    Past Medical History:  Diagnosis Date   Arthritis    Cataract    GERD (gastroesophageal reflux disease)    Hyperlipidemia    Obesity    Prediabetes    Sleep apnea    CPAP   Vitamin D  deficiency     Past Surgical History:  Procedure Laterality Date   CARDIOVERSION N/A 07/27/2021   Procedure: CARDIOVERSION;  Surgeon: Lonni Slain, MD;  Location: [facility] ENDOSCOPY;  Service: Cardiovascular;  Laterality: N/A;   COLONOSCOPY  05/02/2016   Dr.Jacobs   EYE SURGERY     JOINT REPLACEMENT     SPINE SURGERY     [phone]    Family History  Problem Relation Age of Onset   Hypertension Mother    Heart disease Mother    COPD Mother    Heart failure Mother    Kidney disease Mother    Heart failure Father    Colon cancer Father        died at 68;pt unsure age,never knew father   Heart attack Father    Diabetes Brother    Esophageal cancer Neg Hx    Rectal cancer Neg Hx    Stomach cancer Neg Hx    Colon polyps Neg Hx     Social History   Socioeconomic History   Marital status: Married    Spouse name: Not on file   Number of children: Not on file   Years of  education: Not on file   Highest education level: 12th grade  Occupational History   Not on file  Tobacco Use   Smoking status: Former    Current packs/day: 0.00    Average packs/day: 1 pack/day for 32.0 years (32.0 ttl pk-yrs)    Types: Cigarettes    Start date: 02/03/1958    Quit date: 02/03/1990    Years since quitting: 33.5   Smokeless tobacco: Never   Tobacco comments:    Former smoker 07/11/21  Vaping Use   Vaping status: Never Used  Substance and Sexual Activity   Alcohol use: Yes    Alcohol/week: 14.0 standard drinks of alcohol    Types: 14 Standard drinks or equivalent per week    Comment: 2-3 drinks per night per pt 07/11/21   Drug use: No   Sexual activity: Not Currently  Other Topics Concern   Not on file  Social History Narrative   Married   Social Drivers of Health   Financial Resource Strain: Low Risk  (08/26/2023)   Overall Financial Resource Strain (CARDIA)  Difficulty of Paying Living Expenses: Not very hard  Food Insecurity: No Food Insecurity (08/26/2023)   Hunger Vital Sign    Worried About Running Out of Food in the Last Year: Never true    Ran Out of Food in the Last Year: Never true  Transportation Needs: No Transportation Needs (08/26/2023)   PRAPARE - Administrator, Civil Service (Medical): No    Lack of Transportation (Non-Medical): No  Physical Activity: Unknown (08/26/2023)   Exercise Vital Sign    Days of Exercise per Week: 3 days    Minutes of Exercise per Session: Patient declined  Stress: No Stress Concern Present (08/08/2023)   [employer] of Occupational Health - Occupational Stress Questionnaire    Feeling of Stress : Not at all  Social Connections: Moderately Isolated (08/26/2023)   Social Connection and Isolation Panel    Frequency of Communication with Friends and Family: More than three times a week    Frequency of Social Gatherings with Friends and Family: Patient declined    Attends Religious Services: Patient  declined    Database administrator or Organizations: No    Attends Engineer, structural: Not on file    Marital Status: Married  Catering manager Violence: Not At Risk (08/08/2023)   Humiliation, Afraid, Rape, and Kick questionnaire    Fear of Current or Ex-Partner: No    Emotionally Abused: No    Physically Abused: No    Sexually Abused: No    Outpatient Medications Prior to Visit  Medication Sig Dispense Refill   acetaminophen (TYLENOL) 500 MG tablet Take 1,000 mg by mouth in the morning and at bedtime.     amiodarone  (PACERONE ) 200 MG tablet Take 1 tablet (200 mg total) by mouth daily. 90 tablet 3   apixaban  (ELIQUIS ) 5 MG TABS tablet Take 1 tablet by mouth twice daily 180 tablet 1   Cholecalciferol (VITAMIN D ) 125 MCG (5000 UT) CAPS Take 5,000 Units by mouth daily.      ezetimibe  (ZETIA ) 10 MG tablet Take 1 tablet (10 mg total) by mouth daily. 90 tablet 3   Multiple Vitamins-Minerals (CENTRUM SILVER 50+MEN PO) Take 1 tablet by mouth daily.     olmesartan  (BENICAR ) 20 MG tablet Take   1 tablet  at night   for BP & Heart                                                 /                                                                   TAKE                                         BY  MOUTH 90 tablet 3   Omega-3 Fatty Acids (FISH OIL) 1000 MG CAPS Take 1,000 mg by mouth daily.     omeprazole (PRILOSEC) 20 MG capsule Take 20 mg by mouth daily.     rosuvastatin  (CRESTOR ) 40 MG tablet Take 1 tablet (40 mg total) by mouth daily. 90 tablet 3   No facility-administered medications prior to visit.    No Known Allergies  Review of Systems  Constitutional:  Positive for malaise/fatigue. Negative for chills and fever.  HENT:  Positive for congestion. Negative for sinus pain.   Respiratory:  Negative for shortness of breath.   Cardiovascular:  Negative for chest pain, palpitations and leg swelling.  Gastrointestinal:  Negative for  abdominal pain, constipation, diarrhea, nausea and vomiting.  Genitourinary:  Negative for dysuria, frequency and urgency.  Musculoskeletal:  Positive for joint pain. Negative for falls.  Neurological:  Negative for dizziness, focal weakness and headaches.  Psychiatric/Behavioral:  Negative for depression. The patient is not nervous/anxious.        Objective:    Physical Exam Constitutional:      General: He is not in acute distress.    Appearance: He is not ill-appearing.   Eyes:     Extraocular Movements: Extraocular movements intact.     Conjunctiva/sclera: Conjunctivae normal.    Cardiovascular:     Rate and Rhythm: Normal rate.  Pulmonary:     Effort: Pulmonary effort is normal.   Musculoskeletal:     Cervical back: Normal range of motion and neck supple.     Right lower leg: No edema.     Left lower leg: No edema.   Skin:    General: Skin is warm and dry.   Neurological:     General: No focal deficit present.     Mental Status: He is alert and oriented to person, place, and time.     Cranial Nerves: No cranial nerve deficit.     Motor: No weakness.     Coordination: Coordination normal.     Gait: Gait normal.   Psychiatric:        Mood and Affect: Mood normal.        Behavior: Behavior normal.        Thought Content: Thought content normal.      BP 122/64 (BP Location: Right Arm, Patient Position: Sitting)   Pulse (!) 55   Temp 97.6 F (36.4 C) (Temporal)   Ht 5' 10 (1.778 m)   Wt 207 lb (93.9 kg)   SpO2 98%   BMI 29.70 kg/m  Wt Readings from Last 3 Encounters:  08/28/23 207 lb (93.9 kg)  08/08/23 206 lb (93.4 kg)  06/19/23 209 lb (94.8 kg)        Assessment & Plan:   Problem List Items Addressed This Visit     Hypertension   Controlled.  Continue current medication.  Continue CPAP.  Recommend low-sodium diet.  Reviewed renal function from previous visit      OSA on CPAP   Reports last sleep study 8 to 9 years ago.  Ordered by his  previous PCP.  He has been using his CPAP machine.  Recent issues with his mask and needs to order a new mask.  Reports using AutoPap titration.  Form filled out today for him to get a new mask.      Persistent atrial fibrillation (HCC)   Followed by cardiology.  Continue Eliquis .  Asymptomatic.      Positive colorectal cancer screening using  Cologuard test - Primary   Counseling on positive Cologuard test.  Referral placed previously to the lower GI.  He may call to schedule      Secondary hypercoagulable state [facility])   Other Visit Diagnoses       Nasal congestion          Flonase  prescribed for nasal congestion.  Reviewed labs and previous office notes with patient. Handicap placard filled out  I am having Kaitlyn Skowron start on fluticasone . I am also having him maintain his omeprazole, Vitamin D , Fish Oil, acetaminophen, Multiple Vitamins-Minerals (CENTRUM SILVER 50+MEN PO), olmesartan , amiodarone , ezetimibe , rosuvastatin , and Eliquis .  Meds ordered this encounter  Medications   fluticasone  (FLONASE ) 50 MCG/ACT nasal spray    Sig: Place 2 sprays into both nostrils daily.    Dispense:  16 g    Refill:  6    Supervising Provider:   ROLLENE NORRIS A [[id number]]

## 2023-08-28 NOTE — Patient Instructions (Addendum)
   Please call [facility] if you have not heard in the next 2 weeks.  [phone]

## 2023-08-28 NOTE — Assessment & Plan Note (Signed)
 Followed by cardiology.  Continue Eliquis .  Asymptomatic.

## 2023-08-28 NOTE — Assessment & Plan Note (Signed)
 Counseling on positive Cologuard test.  Referral placed previously to the lower GI.  He may call to schedule

## 2023-08-28 NOTE — Assessment & Plan Note (Signed)
 Reports last sleep study 8 to 9 years ago.  Ordered by his previous PCP.  He has been using his CPAP machine.  Recent issues with his mask and needs to order a new mask.  Reports using AutoPap titration.  Form filled out today for him to get a new mask.

## 2023-08-28 NOTE — Assessment & Plan Note (Signed)
 Controlled.  Continue current medication.  Continue CPAP.  Recommend low-sodium diet.  Reviewed renal function from previous visit

## 2023-09-05 ENCOUNTER — Ambulatory Visit: Payer: Medicare HMO | Admitting: Nurse Practitioner

## 2023-09-10 ENCOUNTER — Encounter: Payer: Self-pay | Admitting: Physician Assistant

## 2023-09-12 ENCOUNTER — Telehealth: Payer: Self-pay

## 2023-09-12 NOTE — Telephone Encounter (Signed)
 Copied from CRM [id number]. Topic: General - Other >> Sep 12, 2023  2:11 PM Turkey A wrote: Reason for CRM: [facility] sent fax regarding CPAP supplies their contact number is [phone]

## 2023-09-12 NOTE — Telephone Encounter (Signed)
 This has already been refaxed as of yesterday

## 2023-09-18 ENCOUNTER — Ambulatory Visit: Admitting: Family Medicine

## 2023-09-20 ENCOUNTER — Other Ambulatory Visit (HOSPITAL_COMMUNITY): Payer: Self-pay

## 2023-09-20 ENCOUNTER — Encounter: Payer: Self-pay | Admitting: Cardiology

## 2023-09-20 ENCOUNTER — Ambulatory Visit: Attending: Cardiology | Admitting: Cardiology

## 2023-09-20 VITALS — BP 142/64 | HR 51 | Ht 70.0 in | Wt 207.6 lb

## 2023-09-20 DIAGNOSIS — E782 Mixed hyperlipidemia: Secondary | ICD-10-CM | POA: Diagnosis not present

## 2023-09-20 DIAGNOSIS — R931 Abnormal findings on diagnostic imaging of heart and coronary circulation: Secondary | ICD-10-CM

## 2023-09-20 DIAGNOSIS — I480 Paroxysmal atrial fibrillation: Secondary | ICD-10-CM | POA: Diagnosis not present

## 2023-09-20 DIAGNOSIS — I10 Essential (primary) hypertension: Secondary | ICD-10-CM | POA: Diagnosis not present

## 2023-09-20 MED ORDER — OLMESARTAN MEDOXOMIL 20 MG PO TABS
20.0000 mg | ORAL_TABLET | Freq: Every evening | ORAL | 3 refills | Status: AC
  Filled 2023-09-20: qty 90, 90d supply, fill #0

## 2023-09-20 NOTE — Patient Instructions (Addendum)
 Medication Instructions:  Your physician has recommended you make the following change in your medication: Stop amiodarone  Start Olmesartan  20 mg by mouth daily   *If you need a refill on your cardiac medications before your next appointment, please call your pharmacy*  Lab Work: none If you have labs (blood work) drawn today and your tests are completely normal, you will receive your results only by: MyChart Message (if you have MyChart) OR A paper copy in the mail If you have any lab test that is abnormal or we need to change your treatment, we will call you to review the results.  Testing/Procedures: none  Follow-Up: At [facility], you and your health needs are our priority.  As part of our continuing mission to provide you with exceptional heart care, our providers are all part of one team.  This team includes your primary Cardiologist (physician) and Advanced Practice Providers or APPs (Physician Assistants and Nurse Practitioners) who all work together to provide you with the care you need, when you need it.  Your next appointment:   6 month(s)  Provider:   Gordy Bergamo, MD    We recommend signing up for the patient portal called MyChart.  Sign up information is provided on this After Visit Summary.  MyChart is used to connect with patients for Virtual Visits (Telemedicine).  Patients are able to view lab/test results, encounter notes, upcoming appointments, etc.  Non-urgent messages can be sent to your provider as well.   To learn more about what you can do with MyChart, go to [email or web address].   Other Instructions

## 2023-09-20 NOTE — Progress Notes (Signed)
 Cardiology Office Note:  .   Date:  09/21/2023  ID:  Jason Dennis, DOB 1950-05-05, MRN [id number] PCP: Lendia Boby CROME, NP-C  [facility] Providers Cardiologist:  Gordy Bergamo, MD   History of Present Illness: .   Jason Dennis is a 73 y.o. male with a hx of GERD, HTN, sleep apnea on CPAP, Stress test on 10/24/2021 revealing normal LVEF, partially reversible apical defect and apical thinning for which he underwent coronary CT angiogram on 04/02/2023 revealing coronary calcium  score of 650 in the 75th percentile in the [facility] database and minimal 25-49% stenosis in the coronary arteries.   He was sent to the ED 07/06/23 for new onset afib while he was being prepped for colonoscopy and underwent elective cardioversion and has been maintaining sinus rhythm.  He is now on primary prevention and presents to establish care, previously followed by Dr. Ronal Ross in our practice.  He remains asymptomatic.  Labs   Lab Results  Component Value Date   CHOL 95 06/19/2023   HDL 50.60 06/19/2023   LDLCALC 26 06/19/2023   TRIG 88.0 06/19/2023   CHOLHDL 2 06/19/2023   No results found for: LIPOA  Lab Results  Component Value Date   NA 137 06/19/2023   K 4.2 Slight hemolysis seen 06/19/2023   CO2 25 06/19/2023   GLUCOSE 110 (H) 06/19/2023   BUN 19 06/19/2023   CREATININE 1.11 06/19/2023   CALCIUM  9.5 06/19/2023   GFR 66.24 06/19/2023   EGFR 76 03/21/2023   GFRNONAA >60 07/04/2021      Latest Ref Rng & Units 06/19/2023    9:05 AM 03/21/2023   11:07 AM 12/31/2022   10:09 AM  BMP  Glucose 70 - 99 mg/dL 889  89  94   BUN 6 - 23 mg/dL 19  21  17    Creatinine 0.40 - 1.50 mg/dL 8.88  8.95  8.83   BUN/Creat Ratio 10 - 24  20  SEE NOTE:   Sodium 135 - 145 mEq/L 137  140  140   Potassium 3.5 - 5.1 mEq/L 4.2 Slight hemolysis seen  4.7  4.5   Chloride 96 - 112 mEq/L 104  105  105   CO2 19 - 32 mEq/L 25  21  25    Calcium  8.4 - 10.5 mg/dL 9.5  9.8  9.8       Latest Ref Rng & Units 06/19/2023     9:05 AM 12/31/2022   10:09 AM 09/04/2022    4:32 PM  CBC  WBC 4.0 - 10.5 K/uL 4.2  5.5  6.2   Hemoglobin 13.0 - 17.0 g/dL 86.6  85.7  86.2   Hematocrit 39.0 - 52.0 % 40.1  43.6  42.4   Platelets 150.0 - 400.0 K/uL 208.0  224  266    Lab Results  Component Value Date   HGBA1C 5.3 06/19/2023    Lab Results  Component Value Date   TSH 4.71 06/19/2023     ROS  Review of Systems  Cardiovascular:  Negative for chest pain, dyspnea on exertion and leg swelling.   Physical Exam:   VS:  BP (!) 142/64 (BP Location: Right Arm, Patient Position: Sitting)   Pulse (!) 51   Ht 5' 10 (1.778 m)   Wt 207 lb 9.6 oz (94.2 kg)   SpO2 94%   BMI 29.79 kg/m    Wt Readings from Last 3 Encounters:  09/20/23 207 lb 9.6 oz (94.2 kg)  08/28/23 207 lb (93.9 kg)  08/08/23 206 lb (93.4 kg)    Physical Exam Neck:     Vascular: No carotid bruit or JVD.  Cardiovascular:     Rate and Rhythm: Regular rhythm. Bradycardia present.     Pulses: Intact distal pulses.     Heart sounds: Normal heart sounds. No murmur heard.    No gallop.  Pulmonary:     Effort: Pulmonary effort is normal.     Breath sounds: Normal breath sounds.  Abdominal:     General: Bowel sounds are normal.     Palpations: Abdomen is soft.  Musculoskeletal:     Right lower leg: No edema.     Left lower leg: No edema.    Studies Reviewed: .    Nuclear stress 10/22/2021: Mild apical thinning versus apical ischemia normal LVEF.  Coronary CTA 03/25/2023: Very mild insignificant atherosclerotic changes in LAD, RCA and CX. Coronary calcium  score 650 in the 75th percentile.  EKG:    EKG Interpretation Date/Time:  Friday September 20 2023 10:49:51 EDT Ventricular Rate:  51 PR Interval:  144 QRS Duration:  88 QT Interval:  476 QTC Calculation: 438 R Axis:   11  Text Interpretation: EKG 09/20/2023: Normal sinus rhythm at rate of 51 bpm, normal EKG.  No change from 03/21/2023.  Compared to 07/04/2021, atrial fibrillation with RVR at  rate of 110 bpm no longer present. Confirmed by Minas Bonser, Jagadeesh ([id number]) on 09/20/2023 11:03:12 AM    Medications ordered    Meds ordered this encounter  Medications   olmesartan  (BENICAR ) 20 MG tablet    Sig: Take 1 tablet (20 mg total) by mouth every evening.    Dispense:  90 tablet    Refill:  3    Medications Discontinued During This Encounter  Medication Reason   amiodarone  (PACERONE ) 200 MG tablet Discontinued by provider   olmesartan  (BENICAR ) 20 MG tablet Reorder    ASSESSMENT AND PLAN: .      ICD-10-CM   1. Paroxysmal atrial fibrillation (HCC)  I48.0 EKG 12-Lead    2. Primary hypertension  I10 EKG 12-Lead    olmesartan  (BENICAR ) 20 MG tablet    3. Elevated coronary artery calcium  score 04/02/2023 revealing coronary calcium  score of 650 in the 75th percentile  R93.1 EKG 12-Lead    4. Hyperlipidemia, mixed  E78.2 EKG 12-Lead     Click Here to Calculate/Change CHADS2VASc Score The patient's CHADS2-VASc score is 3, indicating a 3.2% annual risk of stroke.  Therefore, anticoagulation is recommended.    1. Paroxysmal atrial fibrillation (HCC) (Primary) Patient is presently maintaining sinus rhythm, will stop amiodarone  and reassess in 6 months and consider an event monitor or consider initiation of flecainide if needed.  Continue anticoagulation in view of high CHA2DS2-VASc risk score. -Impact of weight loss of 10 to 15% and decreased incidence of PAF discussed. - EKG 12-Lead  2. Primary hypertension Blood pressure is elevated, patient had discontinued olmesartan , reinitiate olmesartan .  Also benefits of reduced A-fib episodes with ARB. - EKG 12-Lead - olmesartan  (BENICAR ) 20 MG tablet; Take 1 tablet (20 mg total) by mouth every evening.  Dispense: 90 tablet; Refill: 3  3. Elevated coronary artery calcium  score 04/02/2023 revealing coronary calcium  score of 650 in the 75th percentile Discussed extensively regarding diet modification, he is presently on a statin, no  indication for aspirin as he is on anticoagulation.  Lipids are well-controlled.  Weight loss discussed. - EKG 12-Lead  4. Hyperlipidemia, mixed See above.  Presently on Crestor  40 mg daily and  Zetia  10 mg daily. - EKG 12-Lead  Signed,  Gordy Bergamo, MD, [facility] 09/21/2023, 8:17 AM [facility] [address] [place], [place] [address] Phone: [phone]. Fax:  [phone]

## 2023-10-22 ENCOUNTER — Other Ambulatory Visit: Payer: Self-pay

## 2023-10-22 ENCOUNTER — Emergency Department (HOSPITAL_BASED_OUTPATIENT_CLINIC_OR_DEPARTMENT_OTHER)
Admission: EM | Admit: 2023-10-22 | Discharge: 2023-10-22 | Disposition: A | Discharge status: Home / Self Care | Attending: Emergency Medicine | Admitting: Emergency Medicine

## 2023-10-22 DIAGNOSIS — Z7901 Long term (current) use of anticoagulants: Secondary | ICD-10-CM | POA: Diagnosis not present

## 2023-10-22 DIAGNOSIS — S61216A Laceration without foreign body of right little finger without damage to nail, initial encounter: Secondary | ICD-10-CM | POA: Diagnosis present

## 2023-10-22 DIAGNOSIS — Z23 Encounter for immunization: Secondary | ICD-10-CM | POA: Insufficient documentation

## 2023-10-22 DIAGNOSIS — W268XXA Contact with other sharp object(s), not elsewhere classified, initial encounter: Secondary | ICD-10-CM | POA: Diagnosis not present

## 2023-10-22 MED ORDER — BACITRACIN ZINC 500 UNIT/GM EX OINT
TOPICAL_OINTMENT | Freq: Once | CUTANEOUS | Status: AC
  Administered 2023-10-22: 31.5 via TOPICAL
  Filled 2023-10-22: qty 28.35

## 2023-10-22 MED ORDER — TETANUS-DIPHTH-ACELL PERTUSSIS 5-2.5-18.5 LF-MCG/0.5 IM SUSY
0.5000 mL | PREFILLED_SYRINGE | Freq: Once | INTRAMUSCULAR | Status: AC
  Administered 2023-10-22: 0.5 mL via INTRAMUSCULAR
  Filled 2023-10-22: qty 0.5

## 2023-10-22 NOTE — ED Triage Notes (Signed)
 Small lac to left pinky from fishing pole. Takes eliquis .  Bleeding controlled. Unsure of tetanus status,

## 2023-10-22 NOTE — ED Provider Notes (Signed)
 [place] EMERGENCY DEPARTMENT AT [facility] Provider Note   CSN: [id number] Arrival date & time: 10/22/23  9241     Patient presents with: Laceration   Braven Wolk is a 73 y.o. male who presents to the ED today with a superficial laceration to the volar surface of the left fifth digit.  This occurred yesterday when he was repairing a fishing rod, takes apixaban  and was having difficulty maintaining hemostasis, hence his presentation to the ED today.  At presentation, hemostasis is been achieved with direct pressure, there is a superficial laceration appearing on the volar surface as mentioned previously.  He is able to flex and extend the finger without any difficulty and has no distal numbness or paresthesia.  Last tetanus vaccination was in 2016.    Laceration      Prior to Admission medications   Medication Sig Start Date End Date Taking? Authorizing Provider  acetaminophen (TYLENOL) 500 MG tablet Take 1,000 mg by mouth in the morning and at bedtime.    [provider]  apixaban  (ELIQUIS ) 5 MG TABS tablet Take 1 tablet by mouth twice daily 05/09/23   Alvan Ronal BRAVO, MD  Cholecalciferol (VITAMIN D ) 125 MCG (5000 UT) CAPS Take 5,000 Units by mouth daily.     [provider]  ezetimibe  (ZETIA ) 10 MG tablet Take 1 tablet (10 mg total) by mouth daily. 04/03/23 09/20/23  Alvan Ronal BRAVO, MD  fluticasone  (FLONASE ) 50 MCG/ACT nasal spray Place 2 sprays into both nostrils daily. 08/28/23   Henson, Vickie L, NP-C  Multiple Vitamins-Minerals (CENTRUM SILVER 50+MEN PO) Take 1 tablet by mouth daily.    [provider]  olmesartan  (BENICAR ) 20 MG tablet Take 1 tablet (20 mg total) by mouth every evening. 09/20/23   Ladona Heinz, MD  Omega-3 Fatty Acids (FISH OIL) 1000 MG CAPS Take 1,000 mg by mouth daily.    [provider]  omeprazole (PRILOSEC) 20 MG capsule Take 20 mg by mouth daily.    [provider]  rosuvastatin  (CRESTOR ) 40 MG tablet Take  1 tablet (40 mg total) by mouth daily. 04/15/23 09/20/23  Alvan Ronal BRAVO, MD    Allergies: Patient has no known allergies.    Review of Systems  Skin:  Positive for wound.  All other systems reviewed and are negative.   Updated Vital Signs BP (!) 170/68 (BP Location: Right Arm)   Pulse 66   Temp (!) 97.5 F (36.4 C) (Oral)   Resp 17   SpO2 100%   Physical Exam Vitals and nursing note reviewed.  Constitutional:      General: He is not in acute distress.    Appearance: He is well-developed.  HENT:     Head: Normocephalic and atraumatic.  Eyes:     Conjunctiva/sclera: Conjunctivae normal.  Cardiovascular:     Rate and Rhythm: Normal rate and regular rhythm.     Heart sounds: No murmur heard. Pulmonary:     Effort: Pulmonary effort is normal. No respiratory distress.     Breath sounds: Normal breath sounds.  Abdominal:     Palpations: Abdomen is soft.     Tenderness: There is no abdominal tenderness.  Musculoskeletal:        General: No swelling.     Cervical back: Neck supple.  Skin:    General: Skin is warm and dry.     Capillary Refill: Capillary refill takes less than 2 seconds.     Findings: Laceration present.     Comments:  Superficial laceration to the volar surface of the left fifth digit at the proximal phalanx.  Hemostasis prior to presentation with direct pressure.  Neurological:     Mental Status: He is alert.  Psychiatric:        Mood and Affect: Mood normal.     (all labs ordered are listed, but only abnormal results are displayed) Labs Reviewed - No data to display  EKG: None  Radiology: No results found.   Procedures   Medications Ordered in the ED  Tdap (BOOSTRIX ) injection 0.5 mL (has no administration in time range)  bacitracin  ointment (has no administration in time range)                                    Medical Decision Making Risk OTC drugs. Prescription drug management.   After assessment of the patient, the wound is no  longer bleeding with direct pressure controlling the bleeding.  Given this, we will have wound cleaned gently with sterile saline, apply clean sterile dressing with bacitracin .  Update his tetanus vaccine as requested by patient, have him follow-up with primary care as needed.     Final diagnoses:  Laceration of right little finger without foreign body without damage to nail, initial encounter    ED Discharge Orders     None          Myriam Dorn BROCKS, [place] 10/22/23 9075    Tegeler, Lonni PARAS, MD 10/22/23 1218

## 2023-10-31 ENCOUNTER — Ambulatory Visit: Admitting: Physician Assistant

## 2023-11-15 ENCOUNTER — Other Ambulatory Visit: Payer: Self-pay

## 2023-11-15 DIAGNOSIS — I4819 Other persistent atrial fibrillation: Secondary | ICD-10-CM

## 2023-11-15 MED ORDER — APIXABAN 5 MG PO TABS
5.0000 mg | ORAL_TABLET | Freq: Two times a day (BID) | ORAL | 1 refills | Status: AC
Start: 2023-11-15 — End: ?

## 2023-11-15 NOTE — Telephone Encounter (Signed)
 Eliquis  5mg  refill request received. Patient is 73 years old, weight-94.2kg, Crea-1.11 on 06/19/23, Diagnosis-Afib, and last seen by Dr. Ladona on 09/20/23. Dose is appropriate based on dosing criteria. Will send in refill to requested pharmacy.

## 2023-12-20 ENCOUNTER — Other Ambulatory Visit (HOSPITAL_COMMUNITY): Payer: Self-pay

## 2024-01-14 ENCOUNTER — Encounter: Payer: Medicare HMO | Admitting: Internal Medicine

## 2024-01-28 ENCOUNTER — Encounter: Payer: Self-pay | Admitting: Family Medicine

## 2024-01-28 ENCOUNTER — Ambulatory Visit: Admitting: Family Medicine

## 2024-01-28 VITALS — BP 134/78 | HR 59 | Temp 97.6°F | Ht 70.0 in | Wt 211.6 lb

## 2024-01-28 DIAGNOSIS — Z0000 Encounter for general adult medical examination without abnormal findings: Secondary | ICD-10-CM

## 2024-01-28 DIAGNOSIS — R202 Paresthesia of skin: Secondary | ICD-10-CM

## 2024-01-28 DIAGNOSIS — G4733 Obstructive sleep apnea (adult) (pediatric): Secondary | ICD-10-CM

## 2024-01-28 DIAGNOSIS — R195 Other fecal abnormalities: Secondary | ICD-10-CM

## 2024-01-28 DIAGNOSIS — R911 Solitary pulmonary nodule: Secondary | ICD-10-CM

## 2024-01-28 DIAGNOSIS — Z5320 Procedure and treatment not carried out because of patient's decision for unspecified reasons: Secondary | ICD-10-CM

## 2024-01-28 DIAGNOSIS — E559 Vitamin D deficiency, unspecified: Secondary | ICD-10-CM | POA: Diagnosis not present

## 2024-01-28 DIAGNOSIS — Z23 Encounter for immunization: Secondary | ICD-10-CM | POA: Diagnosis not present

## 2024-01-28 DIAGNOSIS — Z125 Encounter for screening for malignant neoplasm of prostate: Secondary | ICD-10-CM

## 2024-01-28 DIAGNOSIS — I10 Essential (primary) hypertension: Secondary | ICD-10-CM

## 2024-01-28 DIAGNOSIS — Z0001 Encounter for general adult medical examination with abnormal findings: Secondary | ICD-10-CM

## 2024-01-28 DIAGNOSIS — Z87891 Personal history of nicotine dependence: Secondary | ICD-10-CM

## 2024-01-28 LAB — CBC WITH DIFFERENTIAL/PLATELET
Basophils Absolute: 0 K/uL (ref 0.0–0.1)
Basophils Relative: 1 % (ref 0.0–3.0)
Eosinophils Absolute: 0.3 K/uL (ref 0.0–0.7)
Eosinophils Relative: 5.5 % — ABNORMAL HIGH (ref 0.0–5.0)
HCT: 41.3 % (ref 39.0–52.0)
Hemoglobin: 13.8 g/dL (ref 13.0–17.0)
Lymphocytes Relative: 23.3 % (ref 12.0–46.0)
Lymphs Abs: 1.1 K/uL (ref 0.7–4.0)
MCHC: 33.5 g/dL (ref 30.0–36.0)
MCV: 92.3 fl (ref 78.0–100.0)
Monocytes Absolute: 0.5 K/uL (ref 0.1–1.0)
Monocytes Relative: 9.9 % (ref 3.0–12.0)
Neutro Abs: 2.8 K/uL (ref 1.4–7.7)
Neutrophils Relative %: 60.3 % (ref 43.0–77.0)
Platelets: 174 K/uL (ref 150.0–400.0)
RBC: 4.48 Mil/uL (ref 4.22–5.81)
RDW: 13.6 % (ref 11.5–15.5)
WBC: 4.7 K/uL (ref 4.0–10.5)

## 2024-01-28 LAB — VITAMIN B12: Vitamin B-12: 372 pg/mL (ref 211–911)

## 2024-01-28 LAB — TSH: TSH: 3.62 u[IU]/mL (ref 0.35–5.50)

## 2024-01-28 LAB — PSA, MEDICARE: PSA: 0.79 ng/mL (ref 0.10–4.00)

## 2024-01-28 LAB — T4, FREE: Free T4: 0.51 ng/dL — ABNORMAL LOW (ref 0.60–1.60)

## 2024-01-28 LAB — COMPREHENSIVE METABOLIC PANEL WITH GFR
ALT: 77 U/L — ABNORMAL HIGH (ref 0–53)
AST: 65 U/L — ABNORMAL HIGH (ref 0–37)
Albumin: 4.8 g/dL (ref 3.5–5.2)
Alkaline Phosphatase: 54 U/L (ref 39–117)
BUN: 22 mg/dL (ref 6–23)
CO2: 27 meq/L (ref 19–32)
Calcium: 9.7 mg/dL (ref 8.4–10.5)
Chloride: 104 meq/L (ref 96–112)
Creatinine, Ser: 1.13 mg/dL (ref 0.40–1.50)
GFR: 64.56 mL/min (ref >60.00)
Glucose, Bld: 100 mg/dL — ABNORMAL HIGH (ref 70–99)
Potassium: 4.9 meq/L (ref 3.5–5.1)
Sodium: 139 meq/L (ref 135–145)
Total Bilirubin: 0.8 mg/dL (ref 0.2–1.2)
Total Protein: 6.7 g/dL (ref 6.0–8.3)

## 2024-01-28 LAB — MAGNESIUM: Magnesium: 2 mg/dL (ref 1.5–2.5)

## 2024-01-28 LAB — VITAMIN D 25 HYDROXY (VIT D DEFICIENCY, FRACTURES): VITD: 80.84 ng/mL (ref 30.00–100.00)

## 2024-01-28 NOTE — Progress Notes (Signed)
 Subjective:     Patient ID: Jason Dennis, male    DOB: October 04, 1950, 73 y.o.   MRN: [id number]  Chief Complaint  Patient presents with   Follow-up    68m f/u, feet tingling coming and going, last Saturday had episode of left foot falling asleep     HPI  Discussed the use of AI scribe software for clinical note transcription with the patient, who gave verbal consent to proceed.  History of Present Illness Jason Dennis is a 73 year old male who presents for follow-up on chronic health conditions.  Colorectal cancer screening - Positive Cologuard test. - Colonoscopy completed in 2018. - Colonoscopy attempt in 2023 was not completed due to a cardiac issue during preparation. - Has not yet seen a gastroenterologist since +Cologuard  Peripheral neuropathy and paresthesia - Tingling in feet. - Episode of left foot numbness while sitting on a lawnmower, resolved after walking.  Chronic low back pain - Chronic low back pain following prior back surgery. - No new or worsening pain.  Obstructive sleep apnea and cpap use - Uses CPAP machine for sleep apnea. - Experiences congestion and coughing upon waking, attributed to the humidifier. - Interested in obtaining a new CPAP machine and a repeat sleep study.  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  Hyperlipidemia - Takes cholesterol medication regularly, unsure of dosage.  Diabetes mellitus - No history of diabetes.  Ophthalmologic status - Recent eye procedure to correct astigmatism. - Significant improvement in vision following procedure.  Tobacco use history and lung cancer screening - Quit smoking in 1991 after smoking from age 40 to 46+    Health Maintenance Due  Topic Date Due   Zoster Vaccines- Shingrix (1 of 2) 09/27/1969   Colonoscopy  05/02/2021    Past Medical History:  Diagnosis Date   Arthritis    Cataract     GERD (gastroesophageal reflux disease)    Hyperlipidemia    Obesity    Prediabetes    Sleep apnea    CPAP   Vitamin D  deficiency     Past Surgical History:  Procedure Laterality Date   CARDIOVERSION N/A 07/27/2021   Procedure: CARDIOVERSION;  Surgeon: Lonni Slain, MD;  Location: [facility] ENDOSCOPY;  Service: Cardiovascular;  Laterality: N/A;   COLONOSCOPY  05/02/2016   Dr.Jacobs   EYE SURGERY     JOINT REPLACEMENT     SPINE SURGERY     [phone]    Family History  Problem Relation Age of Onset   Hypertension Mother    Heart disease Mother    COPD Mother    Heart failure Mother    Kidney disease Mother    Heart failure Father    Colon cancer Father        died at 68;pt unsure age,never knew father   Heart attack Father    Diabetes Brother    Esophageal cancer Neg Hx    Rectal cancer Neg Hx    Stomach cancer Neg Hx    Colon polyps Neg Hx     Social History   Socioeconomic History   Marital status: Married    Spouse name: Not on file   Number of children: Not on file   Years of education: Not on file   Highest education level: 12th grade  Occupational History   Not on file  Tobacco Use   Smoking status: Former  Current packs/day: 0.00    Average packs/day: 1 pack/day for 32.0 years (32.0 ttl pk-yrs)    Types: Cigarettes    Start date: 02/03/1958    Quit date: 02/03/1990    Years since quitting: 34.0   Smokeless tobacco: Never   Tobacco comments:    Former smoker 07/11/21  Vaping Use   Vaping status: Never Used  Substance and Sexual Activity   Alcohol use: Yes    Alcohol/week: 14.0 standard drinks of alcohol    Types: 14 Standard drinks or equivalent per week    Comment: 2-3 drinks per night per pt 07/11/21   Drug use: No   Sexual activity: Not Currently  Other Topics Concern   Not on file  Social History Narrative   Married   Social Drivers of Health   Financial Resource Strain: Low Risk  (01/27/2024)   Overall Financial Resource Strain  (CARDIA)    Difficulty of Paying Living Expenses: Not hard at all  Food Insecurity: No Food Insecurity (01/27/2024)   Hunger Vital Sign    Worried About Running Out of Food in the Last Year: Never true    Ran Out of Food in the Last Year: Never true  Transportation Needs: No Transportation Needs (01/27/2024)   PRAPARE - Administrator, Civil Service (Medical): No    Lack of Transportation (Non-Medical): No  Physical Activity: Inactive (01/27/2024)   Exercise Vital Sign    Days of Exercise per Week: 0 days    Minutes of Exercise per Session: Not on file  Stress: No Stress Concern Present (01/27/2024)   [employer] of Occupational Health - Occupational Stress Questionnaire    Feeling of Stress: Only a little  Social Connections: Moderately Isolated (01/27/2024)   Social Connection and Isolation Panel    Frequency of Communication with Friends and Family: More than three times a week    Frequency of Social Gatherings with Friends and Family: Twice a week    Attends Religious Services: Patient declined    Database Administrator or Organizations: No    Attends Engineer, Structural: Not on file    Marital Status: Married  Catering Manager Violence: Not At Risk (08/08/2023)   Humiliation, Afraid, Rape, and Kick questionnaire    Fear of Current or Ex-Partner: No    Emotionally Abused: No    Physically Abused: No    Sexually Abused: No    Outpatient Medications Prior to Visit  Medication Sig Dispense Refill   acetaminophen (TYLENOL) 500 MG tablet Take 1,000 mg by mouth in the morning and at bedtime.     apixaban  (ELIQUIS ) 5 MG TABS tablet Take 1 tablet (5 mg total) by mouth 2 (two) times daily. 180 tablet 1   Cholecalciferol (VITAMIN D ) 125 MCG (5000 UT) CAPS Take 5,000 Units by mouth daily.      fluticasone  (FLONASE ) 50 MCG/ACT nasal spray Place 2 sprays into both nostrils daily. 16 g 6   Multiple Vitamins-Minerals (CENTRUM SILVER 50+MEN PO) Take 1 tablet by  mouth daily.     olmesartan  (BENICAR ) 20 MG tablet Take 1 tablet (20 mg total) by mouth every evening. 90 tablet 3   Omega-3 Fatty Acids (FISH OIL) 1000 MG CAPS Take 1,000 mg by mouth daily.     omeprazole (PRILOSEC) 20 MG capsule Take 20 mg by mouth daily.     ezetimibe  (ZETIA ) 10 MG tablet Take 1 tablet (10 mg total) by mouth daily. 90 tablet 3   rosuvastatin  (CRESTOR )  40 MG tablet Take 1 tablet (40 mg total) by mouth daily. 90 tablet 3   No facility-administered medications prior to visit.    No Known Allergies  Review of Systems  Constitutional:  Negative for chills, fever, malaise/fatigue and weight loss.  HENT:  Negative for congestion, ear pain, sinus pain and sore throat.   Eyes:  Negative for blurred vision, double vision and pain.  Respiratory:  Negative for cough, shortness of breath and wheezing.   Cardiovascular:  Negative for chest pain, palpitations and leg swelling.  Gastrointestinal:  Negative for abdominal pain, constipation, diarrhea, nausea and vomiting.  Genitourinary:  Negative for dysuria, frequency and urgency.  Musculoskeletal:  Positive for back pain. Negative for joint pain and myalgias.  Skin:  Negative for rash.  Neurological:  Positive for tingling. Negative for dizziness, focal weakness and headaches.  Psychiatric/Behavioral:  Negative for depression and memory loss. The patient is not nervous/anxious.        Objective:    Physical Exam Constitutional:      General: He is not in acute distress.    Appearance: He is not ill-appearing.  HENT:     Right Ear: Tympanic membrane, ear canal and external ear normal.     Left Ear: Tympanic membrane, ear canal and external ear normal.     Nose: Nose normal.     Mouth/Throat:     Mouth: Mucous membranes are moist.     Pharynx: Oropharynx is clear.  Eyes:     Extraocular Movements: Extraocular movements intact.     Conjunctiva/sclera: Conjunctivae normal.     Pupils: Pupils are equal, round, and reactive  to light.  Neck:     Thyroid : No thyroid  mass, thyromegaly or thyroid  tenderness.  Cardiovascular:     Rate and Rhythm: Normal rate and regular rhythm.     Pulses: Normal pulses.     Heart sounds: Normal heart sounds.  Pulmonary:     Effort: Pulmonary effort is normal.     Breath sounds: Normal breath sounds.  Abdominal:     General: Bowel sounds are normal. There is no distension.     Palpations: Abdomen is soft.     Tenderness: There is no abdominal tenderness. There is no right CVA tenderness, left CVA tenderness, guarding or rebound.  Musculoskeletal:        General: Normal range of motion.     Cervical back: Normal range of motion and neck supple. No tenderness.     Right lower leg: No edema.     Left lower leg: No edema.  Lymphadenopathy:     Cervical: No cervical adenopathy.  Skin:    General: Skin is warm and dry.     Findings: No lesion or rash.  Neurological:     General: No focal deficit present.     Mental Status: He is alert and oriented to person, place, and time.     Cranial Nerves: No cranial nerve deficit.     Sensory: No sensory deficit.     Motor: No weakness.  Psychiatric:        Mood and Affect: Mood normal.        Behavior: Behavior normal.        Thought Content: Thought content normal.      BP 134/78   Pulse (!) 59   Temp 97.6 F (36.4 C) (Oral)   Ht 5' 10 (1.778 m)   Wt 211 lb 9.6 oz (96 kg)   SpO2 97%  BMI 30.36 kg/m  Wt Readings from Last 3 Encounters:  01/28/24 211 lb 9.6 oz (96 kg)  09/20/23 207 lb 9.6 oz (94.2 kg)  08/28/23 207 lb (93.9 kg)       Assessment & Plan:   Problem List Items Addressed This Visit     Former smoker (32 pack year, quit 1991)   Relevant Orders   Ambulatory referral to Pulmonology   OSA on CPAP   Relevant Orders   Ambulatory referral to Pulmonology   Positive colorectal cancer screening using Cologuard test   Relevant Orders   CBC with Differential/Platelet (Completed)   Comprehensive metabolic  panel with GFR (Completed)   Vitamin D  deficiency   Relevant Orders   VITAMIN D  25 Hydroxy (Vit-D Deficiency, Fractures) (Completed)   Other Visit Diagnoses       Encounter for general adult medical examination with abnormal findings    -  Primary     Need for influenza vaccination       Relevant Orders   Flu vaccine HIGH DOSE PF(Fluzone Trivalent) (Completed)     Tingling of both feet       Relevant Orders   CBC with Differential/Platelet (Completed)   Comprehensive metabolic panel with GFR (Completed)   Magnesium (Completed)   Vitamin B12 (Completed)   TSH (Completed)   T4, free (Completed)     Essential hypertension       Relevant Orders   CBC with Differential/Platelet (Completed)   Comprehensive metabolic panel with GFR (Completed)     Pulmonary nodule       Relevant Orders   Ambulatory referral to Pulmonology     Screening for abdominal aortic aneurysm (AAA) declined         Screening for prostate cancer       Relevant Orders   PSA, Medicare (Completed)       Assessment and Plan Assessment & Plan Adult Wellness Visit Routine adult wellness visit. Recent eye surgery improved vision without glasses.  Preventive health care reviewed.  Counseling on healthy lifestyle including diet and exercise.  Recommend regular dental and eye exams.  Immunizations reviewed.   Check PSA for prostate cancer screening. Follow up with GI for colon cancer screening  Declines AAA screening today    Obstructive sleep apnea Current CPAP machine is outdated. Reports nasal congestion and coughing upon waking, possibly related to CPAP use. No recent sleep study conducted. - Referred to pulmonology for evaluation and potential new sleep study  Solitary pulmonary nodule Previously identified small lung nodule with no current concerns. Former smoker, quit in 1991. No recent follow-up with pulmonology. - Referred to pulmonology for evaluation of lung nodule and potential low-dose lung cancer  screening  Paresthesia of skin Intermittent tingling and numbness in feet, possibly related to prolonged sitting. No diabetes. Chronic low back pain may contribute to symptoms. - Checked vitamin B12 and magnesium levels - Monitor symptoms and consider further evaluation if symptoms persist or worsen  Low back pain Chronic low back pain with no recent evaluation. Previous spine surgery with no new interventions since then. - Will consider referral to orthopedist if symptoms worsen or become more bothersome  Nasal congestion Reports nasal congestion and coughing upon waking, possibly related to CPAP use. No recent changes in symptoms. - Discuss CPAP use and potential adjustments with pulmonology  Essential hypertension Hypertension managed with current medication regimen. - Continue current antihypertensive medications  Positive colorectal cancer screening with cologuard Positive Cologuard test with history of polyps. Previous  colonoscopy in 2018 was unremarkable. Missed follow-up colonoscopy in 2023 due to cardiac issues. Importance of follow-up emphasized to rule out colon cancer. - call to schedule with gastroenterology for further evaluation and potential colonoscopy      I am having Sherida Sharper maintain his omeprazole, Vitamin D , Fish Oil, acetaminophen, Multiple Vitamins-Minerals (CENTRUM SILVER 50+MEN PO), ezetimibe , rosuvastatin , fluticasone , olmesartan , and apixaban .  No orders of the defined types were placed in this encounter.

## 2024-01-28 NOTE — Patient Instructions (Addendum)
 Please go downstairs for labs before you leave.  I will be in touch with your results and with recommendations  You will receive a call from [facility] pulmonology to schedule an appointment for sleep apnea.   Please call [facility]  [phone]

## 2024-01-29 ENCOUNTER — Ambulatory Visit: Payer: Self-pay | Admitting: Family Medicine

## 2024-01-29 ENCOUNTER — Telehealth: Payer: Self-pay

## 2024-01-29 DIAGNOSIS — R7989 Other specified abnormal findings of blood chemistry: Secondary | ICD-10-CM

## 2024-01-29 NOTE — Progress Notes (Signed)
 Please check and see if he is ok with an ultrasound of his liver. RUQ US  for abnormal liver function tests is the order and diagnosis in case I am out of the office when he returns our message. Thanks. He should follow up for recheck of hepatic function panel also in 6-8 weeks.

## 2024-01-29 NOTE — Telephone Encounter (Signed)
 Copied from CRM #[id number]. Topic: General - Other >> Jan 29, 2024  4:35 PM Jasmin G wrote: Reason for CRM: Pt called regarding recent missed call from Ms. Lenard Posey S, CMA, called [facility] and she was not available. Call pt back at [phone].

## 2024-02-03 NOTE — Telephone Encounter (Signed)
 Called pt and was able to address labs and recommendations

## 2024-02-13 ENCOUNTER — Encounter: Admitting: Family Medicine

## 2024-02-13 ENCOUNTER — Telehealth: Payer: Self-pay | Admitting: *Deleted

## 2024-02-13 ENCOUNTER — Encounter: Payer: Self-pay | Admitting: Cardiology

## 2024-02-13 NOTE — Telephone Encounter (Signed)
 Copied from CRM #[id number]. Topic: Appointments - Scheduling Inquiry for Clinic >> Feb 13, 2024 10:17 AM Devaughn RAMAN wrote: Reason for CRM: Pt wife Verneita called to cancel pt appt for December 15th with Dr.Olalere. Pt wife Verneita stated pt  currently has a colonoscopy scheduled January 19th at 8:30am and she would like to know if the pt needs to come in and be seen before his colonoscopy is scheduled or if he can come after January 19th, advised her Dr.Olalere schedule and it is after the January 19th.   Sleep consult now scheduled for 03/16/24 Dr Olena. Nothing further needed.

## 2024-02-17 ENCOUNTER — Ambulatory Visit: Admitting: Pulmonary Disease

## 2024-03-16 ENCOUNTER — Encounter: Payer: Self-pay | Admitting: Pulmonary Disease

## 2024-03-16 ENCOUNTER — Ambulatory Visit: Admitting: Pulmonary Disease

## 2024-03-16 VITALS — BP 132/62 | HR 80 | Temp 97.5°F | Ht 70.0 in | Wt 215.0 lb

## 2024-03-16 DIAGNOSIS — Z87891 Personal history of nicotine dependence: Secondary | ICD-10-CM

## 2024-03-16 DIAGNOSIS — I10 Essential (primary) hypertension: Secondary | ICD-10-CM

## 2024-03-16 DIAGNOSIS — G4733 Obstructive sleep apnea (adult) (pediatric): Secondary | ICD-10-CM | POA: Diagnosis not present

## 2024-03-16 NOTE — Patient Instructions (Signed)
" °  Please schedule a follow up appointment with me when you check out today.  ----------   Please change mask to a P-10 nasal pillows mask + chin strap which I have ordered for you through your DME company [employer]). I have also ordered a new CPAP device for you.    "

## 2024-03-16 NOTE — Progress Notes (Signed)
 "  Synopsis: Referred in 01/2024 for OSA management by Lendia Boby CROME, NP-C  Subjective:   PATIENT ID: Jason Dennis GENDER: male DOB: 10-18-1950, MRN: [id number]  Chief Complaint  Patient presents with   Consult   Obstructive Sleep Apnea    No sleep study since 2016, still using old machine.    HPI  Jason Dennis is a 74 y.o. hard-of-hearing patient with hypertension, atrial fibrillation, and OSA who has been on CPAP for about 8-10 years (still has original device) who presents today to establish care for OSA management.  He requests a new device as his device is >41-44 years old and has started to make whining noises.  He has always used a full face mask. He does not find this very comfortable.  CPAP Data: - Usage days >\= 4h: 100% - Median usage (days used): 8 hrs 9 mins - Pressure: 10 cm H2O (fixed) - Leak: 95% 71.7 LPM (excessive) - Device-estimated AHI: 0.7 (though not able to use this / not reliable in setting of large leak)  Sleep habits: - Bedtime: 9-10 pm - Number of awakenings per night: 2-3x/night - Wake time: 5 am - EDS symptoms: Yes - easily falls asleep in front of TV in middle of the day - Sleep aids: None - Stimulant use: None - H/o MVAs / drowsy driving: Denies. Does not drive much per patient report.  Comorbidities: - Hypertension: Yes - Diabetes: No - Obesity: Yes  Other: - GLP-1 use: No  Additional history gathered from patient's spouse today who attended the visit with the patient.  Pulm Questionnaires:     03/16/2024    9:00 AM  Results of the Epworth flowsheet  Sitting and reading 3  Watching TV 3  Sitting, inactive in a public place (e.g. a theatre or a meeting) 0  As a passenger in a car for an hour without a break 0  Lying down to rest in the afternoon when circumstances permit 0  Sitting and talking to someone 0  Sitting quietly after a lunch without alcohol 1  In a car, while stopped for a few minutes in traffic 0  Total score 7      Past Medical History:  Diagnosis Date   Arthritis    Cataract    GERD (gastroesophageal reflux disease)    Hyperlipidemia    Obesity    Prediabetes    Sleep apnea    CPAP   Vitamin D  deficiency      Family History  Problem Relation Age of Onset   Hypertension Mother    Heart disease Mother    COPD Mother    Heart failure Mother    Kidney disease Mother    Heart failure Father    Colon cancer Father        died at 68;pt unsure age,never knew father   Heart attack Father    Diabetes Brother    Esophageal cancer Neg Hx    Rectal cancer Neg Hx    Stomach cancer Neg Hx    Colon polyps Neg Hx      Past Surgical History:  Procedure Laterality Date   CARDIOVERSION N/A 07/27/2021   Procedure: CARDIOVERSION;  Surgeon: Lonni Slain, MD;  Location: [facility];  Service: Cardiovascular;  Laterality: N/A;   COLONOSCOPY  05/02/2016   Dr.Jacobs   EYE SURGERY     JOINT REPLACEMENT     SPINE SURGERY     [phone]    Social History   Socioeconomic History  Marital status: Married    Spouse name: Not on file   Number of children: Not on file   Years of education: Not on file   Highest education level: 12th grade  Occupational History   Not on file  Tobacco Use   Smoking status: Former    Current packs/day: 0.00    Average packs/day: 1 pack/day for 32.0 years (32.0 ttl pk-yrs)    Types: Cigarettes    Start date: 02/03/1958    Quit date: 02/03/1990    Years since quitting: 34.1   Smokeless tobacco: Never   Tobacco comments:    Former smoker 07/11/21  Vaping Use   Vaping status: Never Used  Substance and Sexual Activity   Alcohol use: Yes    Alcohol/week: 14.0 standard drinks of alcohol    Types: 14 Standard drinks or equivalent per week    Comment: 2-3 drinks per night per pt 07/11/21   Drug use: No   Sexual activity: Not Currently  Other Topics Concern   Not on file  Social History Narrative   Married   Social Drivers of Health   Tobacco  Use: Medium Risk (03/16/2024)   Patient History    Smoking Tobacco Use: Former    Smokeless Tobacco Use: Never    Passive Exposure: Not on file  Financial Resource Strain: Low Risk (01/27/2024)   Overall Financial Resource Strain (CARDIA)    Difficulty of Paying Living Expenses: Not hard at all  Food Insecurity: No Food Insecurity (01/27/2024)   Epic    Worried About Programme Researcher, Broadcasting/film/video in the Last Year: Never true    Ran Out of Food in the Last Year: Never true  Transportation Needs: No Transportation Needs (01/27/2024)   Epic    Lack of Transportation (Medical): No    Lack of Transportation (Non-Medical): No  Physical Activity: Inactive (01/27/2024)   Exercise Vital Sign    Days of Exercise per Week: 0 days    Minutes of Exercise per Session: Not on file  Stress: No Stress Concern Present (01/27/2024)   [employer] of Occupational Health - Occupational Stress Questionnaire    Feeling of Stress: Only a little  Social Connections: Moderately Isolated (01/27/2024)   Social Connection and Isolation Panel    Frequency of Communication with Friends and Family: More than three times a week    Frequency of Social Gatherings with Friends and Family: Twice a week    Attends Religious Services: Patient declined    Database Administrator or Organizations: No    Attends Engineer, Structural: Not on file    Marital Status: Married  Catering Manager Violence: Not At Risk (08/08/2023)   Humiliation, Afraid, Rape, and Kick questionnaire    Fear of Current or Ex-Partner: No    Emotionally Abused: No    Physically Abused: No    Sexually Abused: No  Depression (PHQ2-9): Low Risk (08/08/2023)   Depression (PHQ2-9)    PHQ-2 Score: 0  Alcohol Screen: Low Risk (01/27/2024)   Alcohol Screen    Last Alcohol Screening Score (AUDIT): 6  Housing: Low Risk (01/27/2024)   Epic    Unable to Pay for Housing in the Last Year: No    Number of Times Moved in the Last Year: 0    Homeless in the  Last Year: No  Utilities: Not At Risk (08/08/2023)   AHC Utilities    Threatened with loss of utilities: No  Health Literacy: Adequate Health Literacy (08/08/2023)  B1300 Health Literacy    Frequency of need for help with medical instructions: Never     Allergies[1]   Outpatient Medications Prior to Visit  Medication Sig Dispense Refill   acetaminophen (TYLENOL) 500 MG tablet Take 1,000 mg by mouth in the morning and at bedtime.     apixaban  (ELIQUIS ) 5 MG TABS tablet Take 1 tablet (5 mg total) by mouth 2 (two) times daily. 180 tablet 1   ezetimibe  (ZETIA ) 10 MG tablet Take 1 tablet (10 mg total) by mouth daily. 90 tablet 3   fluticasone  (FLONASE ) 50 MCG/ACT nasal spray Place 2 sprays into both nostrils daily. 16 g 6   Multiple Vitamins-Minerals (CENTRUM SILVER 50+MEN PO) Take 1 tablet by mouth daily.     olmesartan  (BENICAR ) 20 MG tablet Take 1 tablet (20 mg total) by mouth every evening. 90 tablet 3   Omega-3 Fatty Acids (FISH OIL) 1000 MG CAPS Take 1,000 mg by mouth daily.     omeprazole (PRILOSEC) 20 MG capsule Take 20 mg by mouth daily.     rosuvastatin  (CRESTOR ) 40 MG tablet Take 1 tablet (40 mg total) by mouth daily. 90 tablet 3   Cholecalciferol (VITAMIN D ) 125 MCG (5000 UT) CAPS Take 5,000 Units by mouth daily.  (Patient not taking: Reported on 03/16/2024)     No facility-administered medications prior to visit.    ROS   Objective:  Physical Exam Vitals reviewed.  Constitutional:      Appearance: Normal appearance. He is obese.     Comments: sleepy  HENT:     Head: Normocephalic and atraumatic.  Eyes:     General: No scleral icterus.       Right eye: No discharge.        Left eye: No discharge.     Conjunctiva/sclera: Conjunctivae normal.  Pulmonary:     Effort: Pulmonary effort is normal.  Neurological:     Mental Status: He is oriented to person, place, and time. Mental status is at baseline.     Comments: Hard of hearing  Psychiatric:        Behavior: Behavior  normal.        Thought Content: Thought content normal.        Judgment: Judgment normal.      Vitals:   03/16/24 0923  BP: 132/62  Pulse: 80  Temp: (!) 97.5 F (36.4 C)  TempSrc: Oral  SpO2: 94%  Weight: 215 lb (97.5 kg)  Height: 5' 10 (1.778 m)   94% on RA BMI Readings from Last 3 Encounters:  03/16/24 30.85 kg/m  01/28/24 30.36 kg/m  09/20/23 29.79 kg/m   Wt Readings from Last 3 Encounters:  03/16/24 215 lb (97.5 kg)  01/28/24 211 lb 9.6 oz (96 kg)  09/20/23 207 lb 9.6 oz (94.2 kg)     CBC    Component Value Date/Time   WBC 4.7 01/28/2024 0944   RBC 4.48 01/28/2024 0944   HGB 13.8 01/28/2024 0944   HGB 14.2 01/05/2022 1208   HCT 41.3 01/28/2024 0944   HCT 42.1 01/05/2022 1208   PLT 174.0 01/28/2024 0944   PLT 215 01/05/2022 1208   MCV 92.3 01/28/2024 0944   MCV 95 01/05/2022 1208   MCH 31.1 12/31/2022 1009   MCHC 33.5 01/28/2024 0944   RDW 13.6 01/28/2024 0944   RDW 12.0 01/05/2022 1208   LYMPHSABS 1.1 01/28/2024 0944   MONOABS 0.5 01/28/2024 0944   EOSABS 0.3 01/28/2024 0944   BASOSABS 0.0 01/28/2024 0944  Serum HCO3: CO2  Date/Time Value Ref Range Status  01/28/2024 09:44 AM 27 19 - 32 mEq/L Final    Comment:    Elevated LDH levels may cause falsely increased CO2 results. If LDH is >2000 U/L, a positive bias of 12% is possible.  06/19/2023 09:05 AM 25 19 - 32 mEq/L Final  03/21/2023 11:07 AM 21 20 - 29 mmol/L Final    Hemoglobin A1c:    Component Value Date/Time   HGBA1C 5.3 06/19/2023 0905   HGBA1C 5.6 12/31/2022 1009   HGBA1C 5.5 05/31/2022 1512    TSH: TSH  Date/Time Value Ref Range Status  01/28/2024 09:44 AM 3.62 0.35 - 5.50 uIU/mL Final  06/19/2023 09:05 AM 4.71 0.35 - 5.50 uIU/mL Final  12/31/2022 10:09 AM 3.69 0.40 - 4.50 mIU/L Final    Iron Studies: Iron  Date/Time Value Ref Range Status  11/11/2014 10:45 AM 83 50 - 180 ug/dL Final  87/81/7984 90:54 AM 75 42 - 165 ug/dL Final   TIBC  Date/Time Value Ref Range  Status  11/11/2014 10:45 AM 305 250 - 425 ug/dL Final  87/81/7984 90:54 AM 320 215 - 435 ug/dL Final   %SAT  Date/Time Value Ref Range Status  11/11/2014 10:45 AM 27 15 - 60 % Final  02/19/2014 09:45 AM 23 20 - 55 % Final   Ferritin  Date/Time Value Ref Range Status  06/19/2023 09:05 AM 101.6 22.0 - 322.0 ng/mL Final  02/19/2014 09:45 AM 215 22 - 322 ng/mL Final    ABG: No results found for: PHART, PCO2ART, PO2ART, HCO3, TCO2, ACIDBASEDEF, O2SAT   VBG: No results found for: PHVEN, PCO2VEN, PO2VEN   Chest Imaging: N/A  Pulmonary Functions Testing Results:     No data to display         Pathology: N/A  Echocardiogram: N/A  Heart Catheterization: N/A  Sleep Studies: Not available.    Assessment & Plan:     ICD-10-CM   1. OSA (obstructive sleep apnea)  G47.33 Ambulatory Referral for DME    2. Hypertension, unspecified type  I10       Discussion:  Suspect the patient's large leak is interfering with effective CPAP therapy (and accurate reporting of his estimated residual AHI) => persistent pathologic EDS symptoms.  Will request DME ([facility]) send him a new Resmed AutoCPAP device set to 10 cm H2O (no change to current pressure settings until leak addressed, though may be required later on) and with a change in mask from his current FFM to a P-10 nasal pillows mask + chin strap.  Counseled patient that he may or may not require a new sleep study if his current DME provider does not have access to his prior sleep study result.  Reviewed A1c (non-diabetic range), CBC (no e/o anemia), TSH (normal range) in relation to possible future use of GLP-1 agonists as adjunctive therapy to reduce sleep apnea severity and to rule out additional/alternate causes for the patient's EDS symptoms.  BP well controlled on current antihypertensive regimen of olmesartan .  Note: On Eliquis  for atrial fibrillation.   RTC: 3 months.  Current  Medications[2]   MDM Level: Moderate   Lamar Dales, MD Pulmonary, Critical Care & Sleep Medicine [facility] 03/16/2024 10:23 AM     [1] No Known Allergies [2]  Current Outpatient Medications:    acetaminophen (TYLENOL) 500 MG tablet, Take 1,000 mg by mouth in the morning and at bedtime., Disp: , Rfl:    apixaban  (ELIQUIS ) 5 MG TABS tablet,  Take 1 tablet (5 mg total) by mouth 2 (two) times daily., Disp: 180 tablet, Rfl: 1   ezetimibe  (ZETIA ) 10 MG tablet, Take 1 tablet (10 mg total) by mouth daily., Disp: 90 tablet, Rfl: 3   fluticasone  (FLONASE ) 50 MCG/ACT nasal spray, Place 2 sprays into both nostrils daily., Disp: 16 g, Rfl: 6   Multiple Vitamins-Minerals (CENTRUM SILVER 50+MEN PO), Take 1 tablet by mouth daily., Disp: , Rfl:    olmesartan  (BENICAR ) 20 MG tablet, Take 1 tablet (20 mg total) by mouth every evening., Disp: 90 tablet, Rfl: 3   Omega-3 Fatty Acids (FISH OIL) 1000 MG CAPS, Take 1,000 mg by mouth daily., Disp: , Rfl:    omeprazole (PRILOSEC) 20 MG capsule, Take 20 mg by mouth daily., Disp: , Rfl:    rosuvastatin  (CRESTOR ) 40 MG tablet, Take 1 tablet (40 mg total) by mouth daily., Disp: 90 tablet, Rfl: 3   Cholecalciferol (VITAMIN D ) 125 MCG (5000 UT) CAPS, Take 5,000 Units by mouth daily.  (Patient not taking: Reported on 03/16/2024), Disp: , Rfl:   "

## 2024-03-18 ENCOUNTER — Other Ambulatory Visit

## 2024-03-18 ENCOUNTER — Ambulatory Visit: Payer: Self-pay | Admitting: Family Medicine

## 2024-03-18 DIAGNOSIS — R7989 Other specified abnormal findings of blood chemistry: Secondary | ICD-10-CM

## 2024-03-18 LAB — HEPATIC FUNCTION PANEL
ALT: 57 U/L — ABNORMAL HIGH (ref 3–53)
AST: 47 U/L — ABNORMAL HIGH (ref 5–37)
Albumin: 4.6 g/dL (ref 3.5–5.2)
Alkaline Phosphatase: 53 U/L (ref 39–117)
Bilirubin, Direct: 0.2 mg/dL (ref 0.1–0.3)
Total Bilirubin: 0.8 mg/dL (ref 0.2–1.2)
Total Protein: 6.5 g/dL (ref 6.0–8.3)

## 2024-03-20 NOTE — Progress Notes (Signed)
 "  Chief Complaint:positive cologuard, elevated LFTs Primary GI Doctor: Dr. San   HPI:  Patient is a  74  year old male patient with past medical history of GERD,hyperlipidemia, HTN, and afib, who was referred to me by Lendia Boby CROME, NP-C on 08/25/23 for a evaluation of positive cologuard .    09/20/23 cardiology appt. He was sent to the ED 07/06/23 for new onset afib while he was being prepped for colonoscopy and underwent elective cardioversion and has been maintaining sinus rhythm.  Patient is presently maintaining sinus rhythm, will stop amiodarone  and reassess in 6 months and consider an event monitor or consider initiation of flecainide if needed.   Interval History Patient presents for evaluation of elevated liver enzymes and positive Cologuard.   Accompanied by his wife.  Patient reports he use to have routine BM's on daily. He reports as of recent he will go 1-2 days without BM. He has 1-2 BM's when he does. He does have some bloating. No blood in stool.   Patient has history of GERD and taking Prilosec. He reports waking up and having a cough with clearing of his throat. He notes seasonal allergies and uses OTC Flonase . He also reports he has pills get hung up sometimes.   Patient with elevated LFT's since last April 2025 Patient reports he has been on statin for years No personal history of liver disease No family history of liver disease No herbal supplements  No history of IV drug use No history of tattoos No history of blood transfusion  He drinks 1 glass of wine and 1 bourbon every night. His cardiologist has recommended weight loss.  He works part time at [employer].  Patient on Eliquis  5 mg twice daily.  Never had EGD.   Patient's family history includes: no esophageal CA, no colon CA, his father and brother passed with heart attacks  Wt Readings from Last 3 Encounters:  03/23/24 214 lb 8 oz (97.3 kg)  03/16/24 215 lb (97.5 kg)  01/28/24 211 lb 9.6 oz  (96 kg)     Past Medical History:  Diagnosis Date   Arthritis    Cataract    GERD (gastroesophageal reflux disease)    Hyperlipidemia    Obesity    Prediabetes    Sleep apnea    CPAP   Vitamin D  deficiency     Past Surgical History:  Procedure Laterality Date   CARDIOVERSION N/A 07/27/2021   Procedure: CARDIOVERSION;  Surgeon: Lonni Slain, MD;  Location: [facility] ENDOSCOPY;  Service: Cardiovascular;  Laterality: N/A;   CATARACT EXTRACTION  2025   COLONOSCOPY  05/02/2016   Dr.Jacobs   EYE SURGERY     JOINT REPLACEMENT     SPINE SURGERY     [phone]    Current Outpatient Medications  Medication Sig Dispense Refill   acetaminophen (TYLENOL) 500 MG tablet Take 1,000 mg by mouth in the morning and at bedtime.     apixaban  (ELIQUIS ) 5 MG TABS tablet Take 1 tablet (5 mg total) by mouth 2 (two) times daily. 180 tablet 1   ezetimibe  (ZETIA ) 10 MG tablet Take 1 tablet (10 mg total) by mouth daily. 90 tablet 3   fluticasone  (FLONASE ) 50 MCG/ACT nasal spray Place 2 sprays into both nostrils daily. 16 g 6   Multiple Vitamins-Minerals (CENTRUM SILVER 50+MEN PO) Take 1 tablet by mouth daily.     Na Sulfate-K Sulfate-Mg Sulfate concentrate (SUPREP) 17.5-3.13-1.6 GM/177ML SOLN Take 1 kit (354 mLs total) by mouth  once for 1 dose. 354 mL 0   olmesartan  (BENICAR ) 20 MG tablet Take 1 tablet (20 mg total) by mouth every evening. 90 tablet 3   Omega-3 Fatty Acids (FISH OIL) 1000 MG CAPS Take 1,000 mg by mouth daily.     omeprazole (PRILOSEC) 20 MG capsule Take 20 mg by mouth daily.     rosuvastatin  (CRESTOR ) 40 MG tablet Take 1 tablet (40 mg total) by mouth daily. 90 tablet 3   Cholecalciferol (VITAMIN D ) 125 MCG (5000 UT) CAPS Take 5,000 Units by mouth daily.  (Patient not taking: Reported on 03/16/2024)     No current facility-administered medications for this visit.    Allergies as of 03/23/2024   (No Known Allergies)    Family History  Problem Relation Age of Onset    Hypertension Mother    Heart disease Mother    COPD Mother    Heart failure Mother    Kidney disease Mother    Heart failure Father    Colon cancer Father        died at 68;pt unsure age,never knew father   Heart attack Father    Diabetes Brother    Esophageal cancer Neg Hx    Rectal cancer Neg Hx    Stomach cancer Neg Hx    Colon polyps Neg Hx     Review of Systems:    Constitutional: No weight loss, fever, chills, weakness or fatigue HEENT: Eyes: No change in vision               Ears, Nose, Throat:  No change in hearing or congestion Skin: No rash or itching Cardiovascular: No chest pain, chest pressure or palpitations   Respiratory: No SOB or cough Gastrointestinal: See HPI and otherwise negative Genitourinary: No dysuria or change in urinary frequency Neurological: No headache, dizziness or syncope Musculoskeletal: No new muscle or joint pain Hematologic: No bleeding or bruising Psychiatric: No history of depression or anxiety    Physical Exam:  Vital signs: BP 130/70 (BP Location: Left Arm, Patient Position: Sitting, Cuff Size: Normal)   Pulse 65   Ht 5' 10 (1.778 m)   Wt 214 lb 8 oz (97.3 kg)   SpO2 97%   BMI 30.78 kg/m   Constitutional:   Pleasant male appears to be in NAD, Well developed, Well nourished, alert and cooperative Eyes:   PEERL, EOMI. No icterus. Conjunctiva pink. Neck:  Supple Throat: Oral cavity and pharynx without inflammation, swelling or lesion.  Respiratory: Respirations even and unlabored. Lungs clear to auscultation bilaterally.   No wheezes, crackles, or rhonchi.  Cardiovascular: Normal S1, S2. Regular rate and rhythm. No peripheral edema, cyanosis or pallor.  Gastrointestinal:  Soft, nondistended, nontender. No rebound or guarding. Normal bowel sounds. No appreciable masses or hepatomegaly. Rectal:  Not performed.  Msk:  Symmetrical without gross deformities. Without edema, no deformity or joint abnormality.  Neurologic:  Alert and   oriented x4;  grossly normal neurologically.  Skin:   Dry and intact without significant lesions or rashes.  RELEVANT LABS AND IMAGING: CBC    Latest Ref Rng & Units 01/28/2024    9:44 AM 06/19/2023    9:05 AM 12/31/2022   10:09 AM  CBC  WBC 4.0 - 10.5 K/uL 4.7  4.2  5.5   Hemoglobin 13.0 - 17.0 g/dL 86.1  86.6  85.7   Hematocrit 39.0 - 52.0 % 41.3  40.1  43.6   Platelets 150.0 - 400.0 K/uL 174.0  208.0  224  CMP     Latest Ref Rng & Units 03/18/2024   10:30 AM 01/28/2024    9:44 AM 07/23/2023   10:43 AM  CMP  Glucose 70 - 99 mg/dL  899    BUN 6 - 23 mg/dL  22    Creatinine 9.59 - 1.50 mg/dL  8.86    Sodium 864 - 854 mEq/L  139    Potassium 3.5 - 5.1 mEq/L  4.9    Chloride 96 - 112 mEq/L  104    CO2 19 - 32 mEq/L  27    Calcium  8.4 - 10.5 mg/dL  9.7    Total Protein 6.0 - 8.3 g/dL 6.5  6.7  6.8   Total Bilirubin 0.2 - 1.2 mg/dL 0.8  0.8  0.7   Alkaline Phos 39 - 117 U/L 53  54  63   AST 5 - 37 U/L 47  65  25   ALT 3 - 53 U/L 57  77  41      Lab Results  Component Value Date   TSH 3.62 01/28/2024  08/18/23 cologuard- positive  GI procedures: 04/2016 colonoscopy with Dr. Teressa, recall 5 years - One 2 mm polyp in the sigmoid colon, removed with a cold snare. Resected and retrieved. - Internal hemorrhoids. - The examination was otherwise normal on direct and retroflexion views. Path:  Surgical [P], sigmoid, polyp - BENIGN COLONIC MUCOSA WITH A SMALL LYMPHOID AGGREGATE AND MILD MELANOSIS COLI. - NO SIGNIFICANT INFLAMMATION, GRANULOMATA OR ADENOMATOUS EPITHELIUM IDENTIFIED.   02/2010 colonoscopy 4 polyps were found, all removed and retrieved Mild diverticulosis in the sigmoid to descending colon segments Otherwise normal exam  Fibrosis 4 Score = 2.61  Fib-4 interpretation is not validated for people under 35 or over 36 years of age. However, scores under 2.0 are generally considered low risk.    Assessment/Plan: Encounter Diagnoses  Name Primary?   Positive  colorectal cancer screening using Cologuard test Yes   History of colonic polyps    Altered bowel habits    Gastroesophageal reflux disease, unspecified whether esophagitis present    Pill dysphagia    Elevated liver enzymes     74 year old male patient who presents for evaluation of positive Cologuard and elevated liver enzymes.  Patient has had colonoscopy in February 2018 with 1 benign polyp.  Patient reports altered bowel habits with more recent constipation.  We discussed high-fiber diet and incorporating fiber supplementation.  Will also schedule patient for  colonoscopy in [facility] with Dr. San.    Patient also has history of chronic GERD and currently on omeprazole 20 mg p.o. daily.  Patient reports occasional dysphagia with pills as well as cough and globus sensation.  Will go ahead and schedule EGD with possible dilatation in [facility] with Dr. San.  Patient on Eliquis  will need cardiac clearance to hold medication.     Patient has also had elevated liver enzymes since April 2025.  Patient is on reported statin for several years.        Patient also has 1 glass of wine and 1 bourbon each night.  No history of hepatitis or exposure.  No drug use.  We discussed pursuing abdominal ultrasound to evaluate liver for any inflammation.  Recommended Mediterranean diet, weight loss, and decreasing alcohol consumption.  #1 History of colonic polyps  #2 Positive cologuard #3 Altered bowel habits -recommend high fiber diet -Schedule for a colonoscopy in [facility] with Dr. San. The risks and benefits of colonoscopy with possible polypectomy /  biopsies were discussed and the patient agrees to proceed.  -cardiac clearance -Hold Eliquis   2 days before procedure - will instruct when and how to resume after procedure. Risks and benefits of procedure including bleeding, perforation, infection, missed lesions, medication reactions and possible hospitalization or surgery if complications occur explained.  Additional rare but real risk of cardiovascular event such as heart attack or ischemia/infarct of other organs off Eliquis  explained and need to seek urgent help if this occurs. Will communicate by phone or EMR with patient's prescribing provider that to confirm holding Eliquis  is reasonable in this case.   #4 GERD, chronic #5 Pill dysphagia - Continue omeprazole 20 mg po daily -Recommend GERD diet , no late meals  -Schedule for EGD in [facility] with Dr. San. The risks and benefits of EGD with possible biopsies and esophageal dilation were discussed with the patient who agrees to proceed. #6 Elevated LFTs -abdominal ultrasound complete #7 Atrial fibrillation -on Elqiuis    Thank you for the courtesy of this consult. Please call me with any questions or concerns.   Aster Screws, FNP-C [facility] 03/23/2024, 11:44 AM  Cc: Henson, Vickie L, NP-C  "

## 2024-03-23 ENCOUNTER — Encounter: Payer: Self-pay | Admitting: Gastroenterology

## 2024-03-23 ENCOUNTER — Telehealth: Payer: Self-pay

## 2024-03-23 ENCOUNTER — Ambulatory Visit: Admitting: Gastroenterology

## 2024-03-23 VITALS — BP 130/70 | HR 65 | Ht 70.0 in | Wt 214.5 lb

## 2024-03-23 DIAGNOSIS — R7989 Other specified abnormal findings of blood chemistry: Secondary | ICD-10-CM

## 2024-03-23 DIAGNOSIS — R195 Other fecal abnormalities: Secondary | ICD-10-CM | POA: Diagnosis not present

## 2024-03-23 DIAGNOSIS — Z860100 Personal history of colon polyps, unspecified: Secondary | ICD-10-CM

## 2024-03-23 DIAGNOSIS — K219 Gastro-esophageal reflux disease without esophagitis: Secondary | ICD-10-CM

## 2024-03-23 DIAGNOSIS — R194 Change in bowel habit: Secondary | ICD-10-CM

## 2024-03-23 DIAGNOSIS — R1310 Dysphagia, unspecified: Secondary | ICD-10-CM | POA: Diagnosis not present

## 2024-03-23 DIAGNOSIS — R748 Abnormal levels of other serum enzymes: Secondary | ICD-10-CM

## 2024-03-23 MED ORDER — NA SULFATE-K SULFATE-MG SULF 17.5-3.13-1.6 GM/177ML PO SOLN: 1.0000 | Freq: Once | ORAL | 0 refills | Status: AC

## 2024-03-23 NOTE — Telephone Encounter (Signed)
 Pharmacy please advise on holding Eliquis  for 2 days prior to EDG scheduled for 04/28/2024. Thank you.

## 2024-03-23 NOTE — Telephone Encounter (Signed)
 [facility] Pre-operative Risk Assessment     Request for surgical clearance:     Endoscopy Procedure  What type of surgery is being performed?     endoscopic  When is this surgery scheduled?     04/28/24  What type of clearance is required ?   Pharmacy  Are there any medications that need to be held prior to surgery and how long? Eliquis  2 days  Practice name and name of physician performing surgery?      [facility]  What is your office phone and fax number?      Phone- [phone]  Fax- [phone]  Anesthesia type (None, local, MAC, general) ?       MAC   Please route your response to [place], [place]

## 2024-03-23 NOTE — Patient Instructions (Addendum)
 Altered bowel habits Recommend high fiber diet Drink plenty of water Activity as tolerated Start Benefiber or Citrucel over the counter  GERD Continue omeprazole 20 mg po daily GERD diet, no late meals  Fatty liver Weight loss Decrease alcohol consumption Recommend Mediterranean diet   We have sent the following medications to your pharmacy for you to pick up at your convenience: SUPREP  You have been scheduled for an abdominal ultrasound at [facility] (1st floor of hospital) on 04/20/24 at 4:30pm. Please arrive 30 minutes prior to your appointment for registration. Make certain not to have anything to eat or drink after 10:30 am. Should you need to reschedule your appointment, please contact radiology at [phone]. This test typically takes about 30 minutes to perform.   You have been scheduled for an endoscopy and colonoscopy. Please follow the written instructions given to you at your visit today.  If you use inhalers (even only as needed), please bring them with you on the day of your procedure.  DO NOT TAKE 7 DAYS PRIOR TO TEST- Trulicity (dulaglutide) Ozempic, Wegovy (semaglutide) Mounjaro, Zepbound (tirzepatide) Bydureon Bcise (exanatide extended release)  DO NOT TAKE 1 DAY PRIOR TO YOUR TEST Rybelsus (semaglutide) Adlyxin (lixisenatide) Victoza (liraglutide) Byetta (exanatide) ___________________________________________________________________________  Due to recent changes in healthcare laws, you may see the results of your imaging and laboratory studies on MyChart before your provider has had a chance to review them.  We understand that in some cases there may be results that are confusing or concerning to you. Not all laboratory results come back in the same time frame and the provider may be waiting for multiple results in order to interpret others.  Please give us  48 hours in order for your provider to thoroughly review all the results before contacting  the office for clarification of your results.   _______________________________________________________  If your blood pressure at your visit was 140/90 or greater, please contact your primary care physician to follow up on this.  _______________________________________________________  If you are age 68 or older, your body mass index should be between 23-30. Your Body mass index is 30.78 kg/m. If this is out of the aforementioned range listed, please consider follow up with your Primary Care Provider.  If you are age 32 or younger, your body mass index should be between 19-25. Your Body mass index is 30.78 kg/m. If this is out of the aformentioned range listed, please consider follow up with your Primary Care Provider.   ________________________________________________________  The [facility] providers would like to encourage you to use MYCHART to communicate with providers for non-urgent requests or questions.  Due to long hold times on the telephone, sending your provider a message by [facility] may be a faster and more efficient way to get a response.  Please allow 48 business hours for a response.  Please remember that this is for non-urgent requests.  _______________________________________________________  [facility] is using a team-based approach to care.  Your team is made up of your doctor and two to three APPS. Our APPS (Nurse Practitioners and Physician Assistants) work with your physician to ensure care continuity for you. They are fully qualified to address your health concerns and develop a treatment plan. They communicate directly with your gastroenterologist to care for you. Seeing the Advanced Practice Practitioners on your physician's team can help you by facilitating care more promptly, often allowing for earlier appointments, access to diagnostic testing, procedures, and other specialty referrals.   Thank you for trusting  me with your gastrointestinal  care. Deanna May, FNP-C

## 2024-03-30 NOTE — Telephone Encounter (Signed)
 Patient with diagnosis of afib on Eliquis  for anticoagulation.    Procedure: endoscopy Date of procedure: 04/28/24   CHA2DS2-VASc Score = 3   This indicates a 3.2% annual risk of stroke. The patient's score is based upon: CHF History: 0 HTN History: 1 Diabetes History: 0 Stroke History: 0 Vascular Disease History: 1 Age Score: 1 Gender Score: 0      CrCl 68 ml/min Platelet count 174  Patient has not had an Afib/aflutter ablation in the last 3 months, DCCV within the last 4 weeks or a watchman implanted in the last 45 days   Per office protocol, patient can hold Eliquis  for 2 days prior to procedure.    **This guidance is not considered finalized until pre-operative APP has relayed final recommendations.**

## 2024-03-31 NOTE — Telephone Encounter (Addendum)
"  ° °  Patient Name: Jason Dennis  DOB: 05/03/1950 MRN: [id number]  Primary Cardiologist: Gordy Bergamo, MD  Chart reviewed as part of pre-operative protocol coverage. Patient has an upcoming endoscopy scheduled for 04/28/2024, and we were asked to give our recommendations for holding Eliquis . Per pharmacy and office protocol, patient can hold Eliquis  for 2 days prior to procedure.   I will route this recommendation to the requesting party and remove from pre-op pool.  Please call with questions.  Jenisse Vullo E Raquan Iannone, PA-C 03/31/2024, 8:07 PM  "

## 2024-04-02 ENCOUNTER — Other Ambulatory Visit: Payer: Self-pay

## 2024-04-08 ENCOUNTER — Telehealth: Payer: Self-pay | Admitting: *Deleted

## 2024-04-08 MED ORDER — EZETIMIBE 10 MG PO TABS: 10.0000 mg | ORAL_TABLET | Freq: Every day | ORAL | 2 refills | Status: AC

## 2024-04-08 NOTE — Telephone Encounter (Signed)
 Chase, Christina  Linden, [place]; Scott, [place]; Tucker, [place]; Sheree, [place] Got it - pulled docs. Patient just got supplies on 03/11/24

## 2024-04-08 NOTE — Telephone Encounter (Signed)
 Copied from CRM #[id number]. Topic: Clinical - Order For Equipment >> Apr 08, 2024  2:50 PM Celestine FALCON wrote: Reason for CRM: Pt's spouse Verneita is calling requesting the clinic to fax in the referral to [facility] regarding his CPAP machine replacement and supplies. She stated she contacted them yesterday, and she was told they do not have any prescription order from the clinic.   I saw a referral dated for 03/16/2024 via [facility] with the provider comments stating Patient has OSA or probable OSA (Yes or No): Yes Is the patient currently using CPAP within the home? (Yes or No): yes If yes to question 2, what is the current DME provider? [facility] Are there any changes to the order/settings? (if yes, please specify) 10cm H20 with P-10 nasal pillows mask + chin strap  If no to question 2, date of sleep study:  Date of face-to-face encounter: 03/16/2024 Settings: 10cm H20 with P-10 nasal pillows mask + chin strap    Signs and symptoms or probable OSA, mention all that apply (snoring) (morning headaches) (witnessed apneas) (choking) (gasping during sleep): Snoring  Resmed or DreamStation air/auto with heated humidity-DO NOT SUBSTITUTE  Enroll in [employer] Provide patient with SD card when [employer] enrollment expires CPAP supplies needed: mask of choice, headgear, cushions, filters, climate control tubing and water chamber. Heated humidity with all PAP supplies.  Length of Need: Lifetime   PT WILL NEED APPOINTMENT IN OFFICE WITH DME FOR CPAP SET UP.  The referral is showing closed now.  Please call the pt's spouse back at [phone] ok to leave a vm.  ------------------------------------------------------------------------------------------------------------------------------------------  I called and spoke with patient's wife, Verneita ([place]), she states they deal with [facility].  I let her know that we had sent the order for the supplies and a new machine on 03/16/24 and  received confirmation from [employer] that it was received.  I explained that [employer] sends the information to [place] and they work with [employer] and [employer] has to wait on [employer] to get back to them because they are the 3rd party involved.  I let her know I would send a message to our PCCs to have them follow up to see if we can speed things up.  I asked that she call back if they have not heard anything in a week.  She verbalized understanding.  [facility], Can you please follow up on this order with [employer] [facility])?  Thank you.

## 2024-04-20 ENCOUNTER — Ambulatory Visit (HOSPITAL_COMMUNITY)

## 2024-04-28 ENCOUNTER — Encounter: Admitting: Gastroenterology

## 2024-06-15 ENCOUNTER — Ambulatory Visit: Admitting: Pulmonary Disease

## 2024-08-11 ENCOUNTER — Ambulatory Visit
